# Patient Record
Sex: Male | Born: 1935 | Race: White | Hispanic: No | Smoking: Former smoker
Health system: Southern US, Community
[De-identification: ages and names within clinical notes are randomized; demographics above are authoritative.]

## PROBLEM LIST (undated history)

## (undated) DIAGNOSIS — K449 Diaphragmatic hernia without obstruction or gangrene: Secondary | ICD-10-CM

## (undated) DIAGNOSIS — I509 Heart failure, unspecified: Secondary | ICD-10-CM

## (undated) DIAGNOSIS — E041 Nontoxic single thyroid nodule: Secondary | ICD-10-CM

## (undated) DIAGNOSIS — R51 Headache: Secondary | ICD-10-CM

## (undated) DIAGNOSIS — I2510 Atherosclerotic heart disease of native coronary artery without angina pectoris: Secondary | ICD-10-CM

## (undated) DIAGNOSIS — K219 Gastro-esophageal reflux disease without esophagitis: Secondary | ICD-10-CM

## (undated) DIAGNOSIS — F0390 Unspecified dementia without behavioral disturbance: Secondary | ICD-10-CM

## (undated) DIAGNOSIS — M549 Dorsalgia, unspecified: Secondary | ICD-10-CM

## (undated) DIAGNOSIS — I82409 Acute embolism and thrombosis of unspecified deep veins of unspecified lower extremity: Secondary | ICD-10-CM

## (undated) DIAGNOSIS — R519 Headache, unspecified: Secondary | ICD-10-CM

## (undated) DIAGNOSIS — C61 Malignant neoplasm of prostate: Secondary | ICD-10-CM

## (undated) DIAGNOSIS — I10 Essential (primary) hypertension: Secondary | ICD-10-CM

## (undated) DIAGNOSIS — E1140 Type 2 diabetes mellitus with diabetic neuropathy, unspecified: Secondary | ICD-10-CM

## (undated) DIAGNOSIS — M8580 Other specified disorders of bone density and structure, unspecified site: Secondary | ICD-10-CM

## (undated) DIAGNOSIS — N189 Chronic kidney disease, unspecified: Secondary | ICD-10-CM

## (undated) DIAGNOSIS — H81399 Other peripheral vertigo, unspecified ear: Secondary | ICD-10-CM

## (undated) DIAGNOSIS — E785 Hyperlipidemia, unspecified: Secondary | ICD-10-CM

## (undated) DIAGNOSIS — E049 Nontoxic goiter, unspecified: Secondary | ICD-10-CM

## (undated) DIAGNOSIS — G8929 Other chronic pain: Secondary | ICD-10-CM

## (undated) DIAGNOSIS — H3530 Unspecified macular degeneration: Secondary | ICD-10-CM

## (undated) DIAGNOSIS — M47816 Spondylosis without myelopathy or radiculopathy, lumbar region: Secondary | ICD-10-CM

## (undated) DIAGNOSIS — G4730 Sleep apnea, unspecified: Secondary | ICD-10-CM

## (undated) DIAGNOSIS — E119 Type 2 diabetes mellitus without complications: Secondary | ICD-10-CM

## (undated) HISTORY — DX: Chronic kidney disease, unspecified: N18.9

## (undated) HISTORY — DX: Nontoxic single thyroid nodule: E04.1

## (undated) HISTORY — DX: Hyperlipidemia, unspecified: E78.5

## (undated) HISTORY — DX: Sleep apnea, unspecified: G47.30

## (undated) HISTORY — DX: Acute embolism and thrombosis of unspecified deep veins of unspecified lower extremity: I82.409

## (undated) HISTORY — PX: INGUINAL HERNIA REPAIR: SUR1180

## (undated) HISTORY — DX: Diaphragmatic hernia without obstruction or gangrene: K44.9

## (undated) HISTORY — DX: Atherosclerotic heart disease of native coronary artery without angina pectoris: I25.10

## (undated) HISTORY — PX: CHOLECYSTECTOMY: SHX55

## (undated) HISTORY — DX: Essential (primary) hypertension: I10

## (undated) HISTORY — DX: Nontoxic goiter, unspecified: E04.9

## (undated) HISTORY — PX: OTHER SURGICAL HISTORY: SHX169

## (undated) HISTORY — DX: Other specified disorders of bone density and structure, unspecified site: M85.80

## (undated) HISTORY — DX: Type 2 diabetes mellitus with diabetic neuropathy, unspecified: E11.40

## (undated) HISTORY — DX: Spondylosis without myelopathy or radiculopathy, lumbar region: M47.816

## (undated) HISTORY — DX: Malignant neoplasm of prostate: C61

## (undated) HISTORY — PX: CATARACT EXTRACTION, BILATERAL: SHX1313

## (undated) HISTORY — PX: TONSILLECTOMY AND ADENOIDECTOMY: SUR1326

## (undated) HISTORY — PX: TONSILECTOMY, ADENOIDECTOMY, BILATERAL MYRINGOTOMY AND TUBES: SHX2538

## (undated) HISTORY — DX: Type 2 diabetes mellitus without complications: E11.9

## (undated) HISTORY — PX: KNEE ARTHROSCOPY: SHX127

## (undated) HISTORY — PX: APPENDECTOMY: SHX54

## (undated) HISTORY — DX: Gastro-esophageal reflux disease without esophagitis: K21.9

---

## 1997-12-30 DIAGNOSIS — I82409 Acute embolism and thrombosis of unspecified deep veins of unspecified lower extremity: Secondary | ICD-10-CM

## 1997-12-30 HISTORY — DX: Acute embolism and thrombosis of unspecified deep veins of unspecified lower extremity: I82.409

## 1999-10-03 ENCOUNTER — Encounter: Admission: RE | Admit: 1999-10-03 | Discharge: 2000-01-01 | Payer: Self-pay | Admitting: Anesthesiology

## 2000-01-29 ENCOUNTER — Encounter: Admission: RE | Admit: 2000-01-29 | Discharge: 2000-01-29 | Payer: Self-pay | Admitting: Neurosurgery

## 2000-08-25 ENCOUNTER — Encounter: Admission: RE | Admit: 2000-08-25 | Discharge: 2000-11-23 | Payer: Self-pay | Admitting: Anesthesiology

## 2000-11-27 ENCOUNTER — Encounter: Payer: Self-pay | Admitting: Family Medicine

## 2000-11-27 ENCOUNTER — Ambulatory Visit (HOSPITAL_COMMUNITY): Admission: RE | Admit: 2000-11-27 | Discharge: 2000-11-27 | Payer: Self-pay | Admitting: Family Medicine

## 2001-04-28 ENCOUNTER — Encounter: Admission: RE | Admit: 2001-04-28 | Discharge: 2001-07-27 | Payer: Self-pay | Admitting: Anesthesiology

## 2001-06-10 ENCOUNTER — Ambulatory Visit (HOSPITAL_COMMUNITY): Admission: RE | Admit: 2001-06-10 | Discharge: 2001-06-10 | Payer: Self-pay | Admitting: Neurosurgery

## 2002-04-15 ENCOUNTER — Inpatient Hospital Stay (HOSPITAL_COMMUNITY): Admission: EM | Admit: 2002-04-15 | Discharge: 2002-04-17 | Payer: Self-pay | Admitting: Emergency Medicine

## 2002-04-15 ENCOUNTER — Encounter: Payer: Self-pay | Admitting: Emergency Medicine

## 2002-04-17 ENCOUNTER — Encounter: Payer: Self-pay | Admitting: Family Medicine

## 2002-10-13 ENCOUNTER — Encounter: Payer: Self-pay | Admitting: *Deleted

## 2002-10-13 ENCOUNTER — Inpatient Hospital Stay (HOSPITAL_COMMUNITY): Admission: EM | Admit: 2002-10-13 | Discharge: 2002-10-14 | Payer: Self-pay | Admitting: Emergency Medicine

## 2002-11-03 ENCOUNTER — Encounter: Payer: Self-pay | Admitting: Family Medicine

## 2002-11-03 ENCOUNTER — Ambulatory Visit (HOSPITAL_COMMUNITY): Admission: RE | Admit: 2002-11-03 | Discharge: 2002-11-03 | Payer: Self-pay | Admitting: Family Medicine

## 2002-11-24 ENCOUNTER — Encounter (INDEPENDENT_AMBULATORY_CARE_PROVIDER_SITE_OTHER): Payer: Self-pay | Admitting: *Deleted

## 2002-11-24 ENCOUNTER — Encounter: Payer: Self-pay | Admitting: General Surgery

## 2002-11-24 ENCOUNTER — Ambulatory Visit (HOSPITAL_COMMUNITY): Admission: RE | Admit: 2002-11-24 | Discharge: 2002-11-24 | Payer: Self-pay | Admitting: General Surgery

## 2003-01-10 ENCOUNTER — Ambulatory Visit: Admission: RE | Admit: 2003-01-10 | Discharge: 2003-01-10 | Payer: Self-pay | Admitting: *Deleted

## 2003-05-12 ENCOUNTER — Encounter: Payer: Self-pay | Admitting: General Surgery

## 2003-05-12 ENCOUNTER — Ambulatory Visit (HOSPITAL_COMMUNITY): Admission: RE | Admit: 2003-05-12 | Discharge: 2003-05-12 | Payer: Self-pay | Admitting: General Surgery

## 2003-05-20 ENCOUNTER — Inpatient Hospital Stay (HOSPITAL_COMMUNITY): Admission: EM | Admit: 2003-05-20 | Discharge: 2003-05-22 | Payer: Self-pay | Admitting: Emergency Medicine

## 2003-12-29 ENCOUNTER — Ambulatory Visit (HOSPITAL_COMMUNITY): Admission: RE | Admit: 2003-12-29 | Discharge: 2003-12-29 | Payer: Self-pay | Admitting: Family Medicine

## 2004-01-06 ENCOUNTER — Inpatient Hospital Stay (HOSPITAL_COMMUNITY): Admission: EM | Admit: 2004-01-06 | Discharge: 2004-01-09 | Payer: Self-pay | Admitting: Emergency Medicine

## 2004-03-30 ENCOUNTER — Emergency Department (HOSPITAL_COMMUNITY): Admission: EM | Admit: 2004-03-30 | Discharge: 2004-03-30 | Payer: Self-pay | Admitting: Emergency Medicine

## 2004-04-29 ENCOUNTER — Ambulatory Visit (HOSPITAL_BASED_OUTPATIENT_CLINIC_OR_DEPARTMENT_OTHER): Admission: RE | Admit: 2004-04-29 | Discharge: 2004-04-29 | Payer: Self-pay | Admitting: *Deleted

## 2004-04-30 ENCOUNTER — Ambulatory Visit (HOSPITAL_COMMUNITY): Admission: RE | Admit: 2004-04-30 | Discharge: 2004-04-30 | Payer: Self-pay | Admitting: Family Medicine

## 2004-06-27 ENCOUNTER — Encounter (HOSPITAL_COMMUNITY): Admission: RE | Admit: 2004-06-27 | Discharge: 2004-06-28 | Payer: Self-pay | Admitting: Family Medicine

## 2005-04-25 ENCOUNTER — Ambulatory Visit (HOSPITAL_COMMUNITY): Admission: RE | Admit: 2005-04-25 | Discharge: 2005-04-25 | Payer: Self-pay | Admitting: General Surgery

## 2005-10-02 ENCOUNTER — Emergency Department (HOSPITAL_COMMUNITY): Admission: EM | Admit: 2005-10-02 | Discharge: 2005-10-03 | Payer: Self-pay | Admitting: Emergency Medicine

## 2005-10-09 ENCOUNTER — Encounter: Admission: RE | Admit: 2005-10-09 | Discharge: 2005-10-09 | Payer: Self-pay | Admitting: Gastroenterology

## 2005-10-13 ENCOUNTER — Inpatient Hospital Stay (HOSPITAL_COMMUNITY): Admission: EM | Admit: 2005-10-13 | Discharge: 2005-10-18 | Payer: Self-pay | Admitting: Emergency Medicine

## 2005-10-15 ENCOUNTER — Encounter (INDEPENDENT_AMBULATORY_CARE_PROVIDER_SITE_OTHER): Payer: Self-pay | Admitting: *Deleted

## 2005-11-28 ENCOUNTER — Ambulatory Visit (HOSPITAL_COMMUNITY): Admission: RE | Admit: 2005-11-28 | Discharge: 2005-11-28 | Payer: Self-pay | Admitting: Urology

## 2005-12-02 ENCOUNTER — Ambulatory Visit: Admission: RE | Admit: 2005-12-02 | Discharge: 2005-12-20 | Payer: Self-pay | Admitting: Urology

## 2005-12-03 ENCOUNTER — Ambulatory Visit: Payer: Self-pay | Admitting: Pulmonary Disease

## 2005-12-09 ENCOUNTER — Ambulatory Visit (HOSPITAL_COMMUNITY): Admission: RE | Admit: 2005-12-09 | Discharge: 2005-12-09 | Payer: Self-pay | Admitting: Pulmonary Disease

## 2005-12-19 ENCOUNTER — Ambulatory Visit (HOSPITAL_COMMUNITY): Admission: RE | Admit: 2005-12-19 | Discharge: 2005-12-19 | Payer: Self-pay | Admitting: Family Medicine

## 2005-12-20 ENCOUNTER — Ambulatory Visit: Payer: Self-pay | Admitting: Pulmonary Disease

## 2006-01-06 ENCOUNTER — Ambulatory Visit: Admission: RE | Admit: 2006-01-06 | Discharge: 2006-02-03 | Payer: Self-pay | Admitting: Radiation Oncology

## 2006-01-21 ENCOUNTER — Ambulatory Visit: Payer: Self-pay | Admitting: Pulmonary Disease

## 2006-05-23 ENCOUNTER — Ambulatory Visit (HOSPITAL_COMMUNITY): Admission: RE | Admit: 2006-05-23 | Discharge: 2006-05-23 | Payer: Self-pay | Admitting: Family Medicine

## 2006-05-28 ENCOUNTER — Ambulatory Visit: Admission: RE | Admit: 2006-05-28 | Discharge: 2006-07-03 | Payer: Self-pay | Admitting: Radiation Oncology

## 2006-06-18 ENCOUNTER — Ambulatory Visit: Payer: Self-pay | Admitting: Cardiology

## 2006-06-24 ENCOUNTER — Ambulatory Visit: Payer: Self-pay

## 2006-06-24 ENCOUNTER — Ambulatory Visit: Payer: Self-pay | Admitting: Cardiology

## 2006-08-25 ENCOUNTER — Ambulatory Visit: Admission: RE | Admit: 2006-08-25 | Discharge: 2006-09-21 | Payer: Self-pay | Admitting: Radiation Oncology

## 2007-02-23 ENCOUNTER — Encounter: Admission: RE | Admit: 2007-02-23 | Discharge: 2007-02-23 | Payer: Self-pay | Admitting: Nephrology

## 2007-04-29 ENCOUNTER — Ambulatory Visit: Payer: Self-pay | Admitting: Cardiology

## 2007-05-20 ENCOUNTER — Ambulatory Visit: Payer: Self-pay | Admitting: Internal Medicine

## 2007-05-20 ENCOUNTER — Ambulatory Visit (HOSPITAL_COMMUNITY): Admission: RE | Admit: 2007-05-20 | Discharge: 2007-05-20 | Payer: Self-pay | Admitting: Cardiology

## 2008-08-25 ENCOUNTER — Encounter: Admission: RE | Admit: 2008-08-25 | Discharge: 2008-08-25 | Payer: Self-pay | Admitting: General Surgery

## 2008-08-25 ENCOUNTER — Other Ambulatory Visit: Admission: RE | Admit: 2008-08-25 | Discharge: 2008-08-25 | Payer: Self-pay | Admitting: Interventional Radiology

## 2008-08-25 ENCOUNTER — Encounter (INDEPENDENT_AMBULATORY_CARE_PROVIDER_SITE_OTHER): Payer: Self-pay | Admitting: Interventional Radiology

## 2008-09-14 ENCOUNTER — Encounter: Admission: RE | Admit: 2008-09-14 | Discharge: 2008-11-09 | Payer: Self-pay | Admitting: Family Medicine

## 2008-09-25 ENCOUNTER — Emergency Department (HOSPITAL_COMMUNITY): Admission: EM | Admit: 2008-09-25 | Discharge: 2008-09-25 | Payer: Self-pay | Admitting: Emergency Medicine

## 2008-10-18 ENCOUNTER — Ambulatory Visit: Payer: Self-pay | Admitting: Cardiology

## 2008-10-18 ENCOUNTER — Inpatient Hospital Stay (HOSPITAL_COMMUNITY): Admission: EM | Admit: 2008-10-18 | Discharge: 2008-10-21 | Payer: Self-pay | Admitting: Emergency Medicine

## 2008-10-19 ENCOUNTER — Ambulatory Visit: Payer: Self-pay | Admitting: Gastroenterology

## 2008-10-19 ENCOUNTER — Encounter (INDEPENDENT_AMBULATORY_CARE_PROVIDER_SITE_OTHER): Payer: Self-pay | Admitting: Family Medicine

## 2008-10-21 ENCOUNTER — Inpatient Hospital Stay: Admission: RE | Admit: 2008-10-21 | Discharge: 2008-11-10 | Payer: Self-pay | Admitting: Internal Medicine

## 2009-02-13 ENCOUNTER — Emergency Department (HOSPITAL_COMMUNITY): Admission: EM | Admit: 2009-02-13 | Discharge: 2009-02-13 | Payer: Self-pay | Admitting: Emergency Medicine

## 2009-09-12 ENCOUNTER — Encounter: Admission: RE | Admit: 2009-09-12 | Discharge: 2009-09-12 | Payer: Self-pay | Admitting: General Surgery

## 2010-03-03 IMAGING — CT CT CERVICAL SPINE W/O CM
2 of 3 series · 8 of 14 positions shown, 9 images · non-contrast
Comparison: 09/25/2008.

CT HEAD

CLINICAL DATA: Fall.

CT HEAD WITHOUT CONTRAST
CT CERVICAL SPINE WITHOUT CONTRAST
TECHNIQUE: Multidetector CT imaging of the head and cervical spine
was performed following the standard protocol without intravenous
contrast.  Multiplanar CT image reconstructions of the cervical
spine were also generated.

[Series 4: cervical 2.0 b31s · axial · 0.34mm/px · z∈[+98,+206]mm · 4 of 121 slices shown]
[im 25/121  bone]
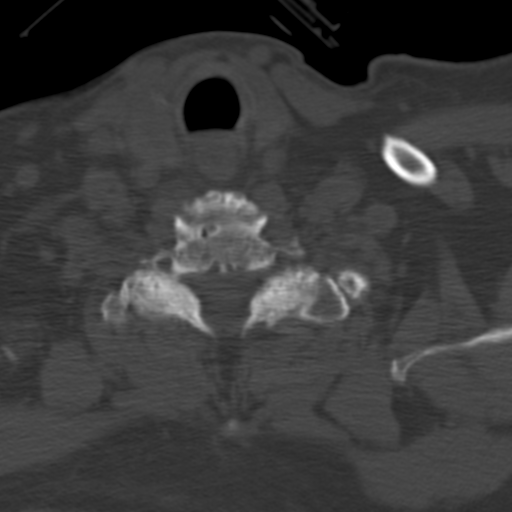
[im 49/121  bone]
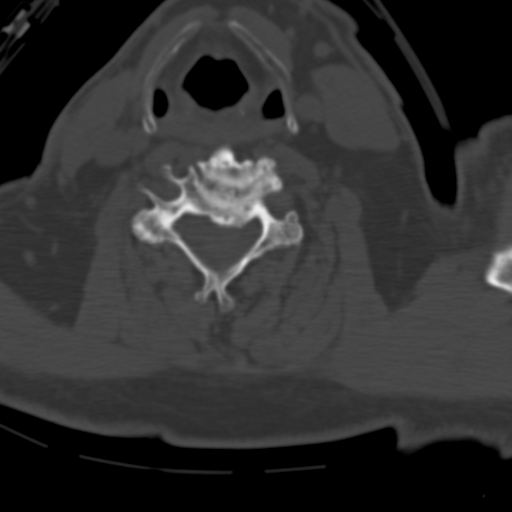
[im 73/121  bone]
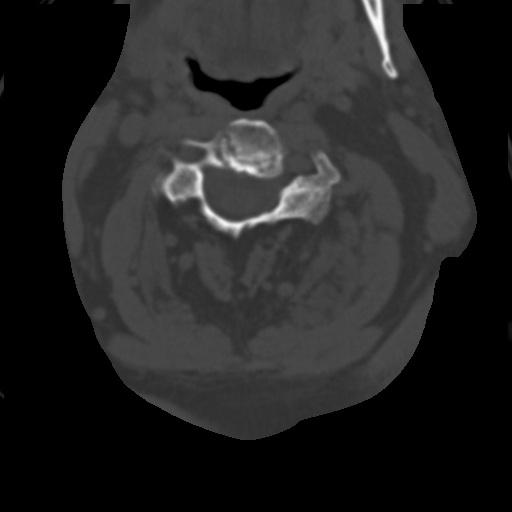
[im 97/121  bone]
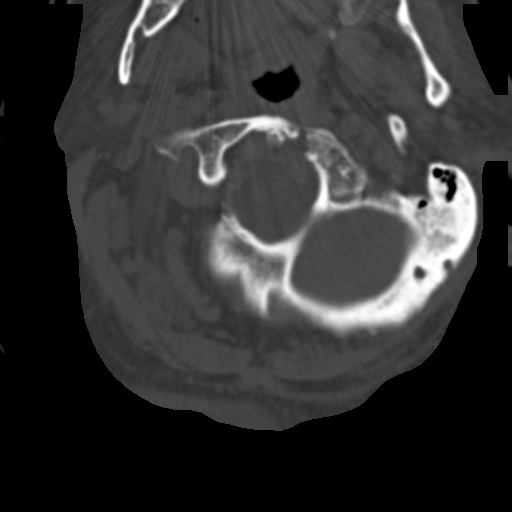

[Series 8: cervical 2.0 spo · axial · 0.19mm/px · z∈[+82,+191]mm · 4 of 125 slices shown, 5 images]
[im 25/125  soft-tissue]
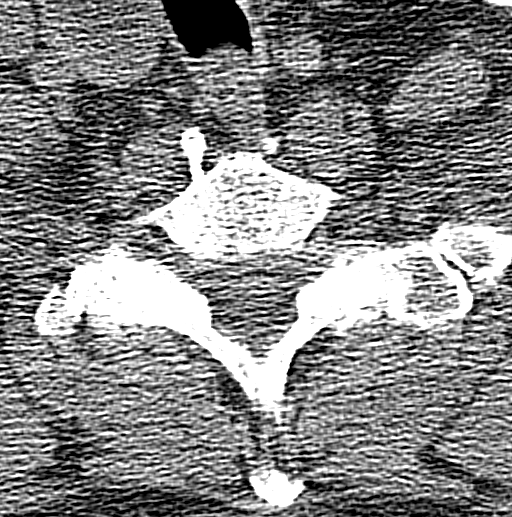
[im 25/125  bone]
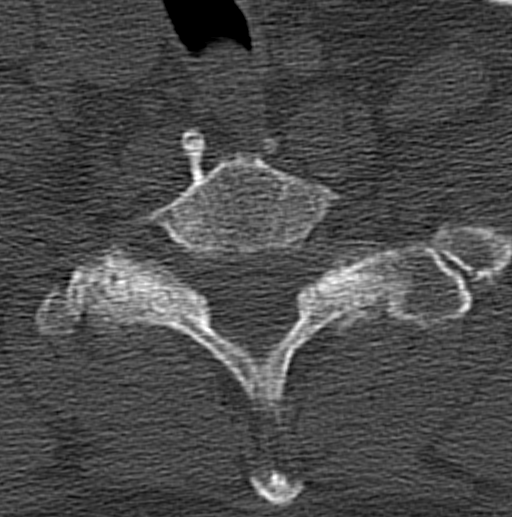
[im 50/125  bone]
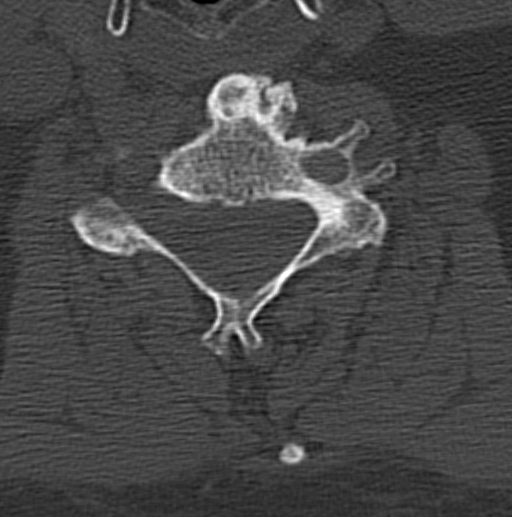
[im 75/125  bone]
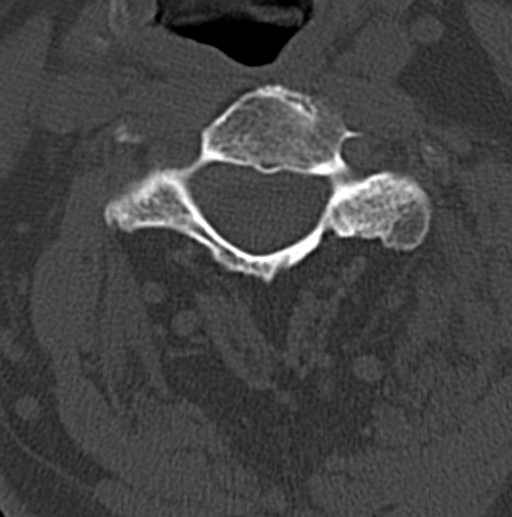
[im 100/125  bone]
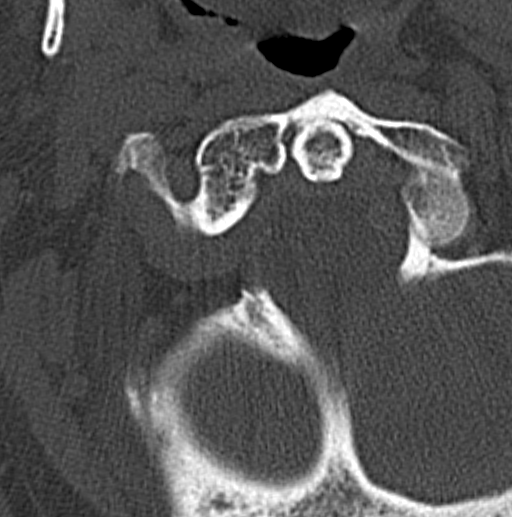

[8 of 14 positions shown; findings below may reference images not displayed]

FINDINGS: No intracranial hemorrhage.  Scattered small vessel
disease type changes without CT evidence of large acute infarct.
No hydrocephalus.  No intracranial mass lesion detected on this
unenhanced exam.  No skull fracture.  Mild polypoid opacification
left maxillary sinus.
IMPRESSION: No skull fracture intracranial hemorrhage.

Scattered small vessel disease type changes without CT evidence of
large acute infarct.

CT CERVICAL SPINE
FINDINGS: Enlarged right lobe of thyroid gland with substernal
extension with heterogeneity.  Ultrasound can be obtained further
delineation.

Mild dextroscoliosis.  Fusion C2 and C3.  Minimal anterior slip of
C3 upon C4 felt to be degenerative in origin with spinal stenosis
and cord flattening.  There are also degenerative changes at the C4-
5, C5-6 and C6-7 level.  No fracture.  If ligamentous injury or
cord injury were high clinical concern MR [facility] be considered.
IMPRESSION: Scoliosis and degenerative changes with spinal stenosis and cord
flattening most notable C3-4 level.  No fracture noted.  Please see
above.

Enlarged right lobe of the thyroid gland with substernal extension
and heterogeneity.  Ultrasound can be obtained further delineation.

## 2010-03-04 IMAGING — US US CAROTID DUPLEX BILAT
1 series · 13 of 24 positions shown · non-contrast
Comparison: None.

CLINICAL DATA: 78-year-old male with syncope.  Bell's palsy.
Diabetes and hypertension.

BILATERAL CAROTID DUPLEX ULTRASOUND
TECHNIQUE: Gray scale imaging, color Doppler and duplex ultrasound
was performed of bilateral carotid and vertebral arteries in the
neck.

[Series 1: unknown · 0.07mm/px · 13 of 53 slices shown]
[im 1/53]
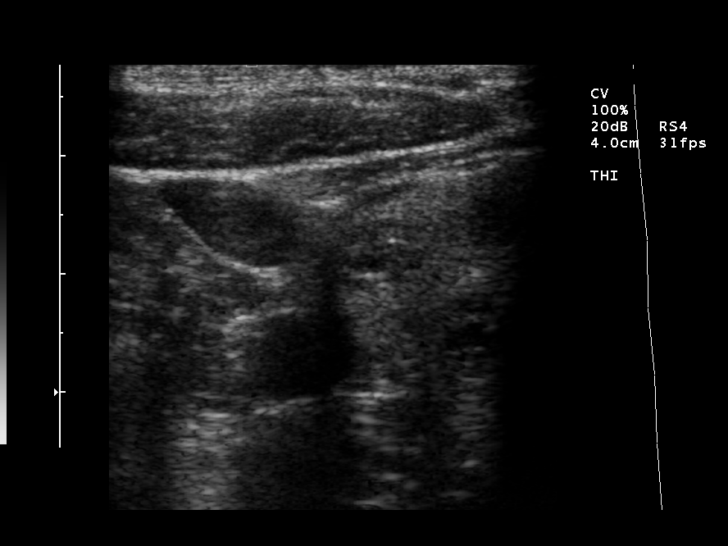
[im 5/53]
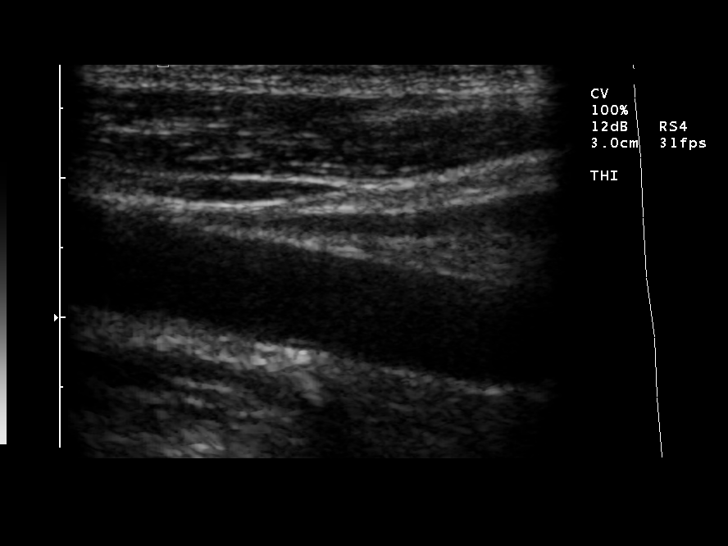
[im 10/53]
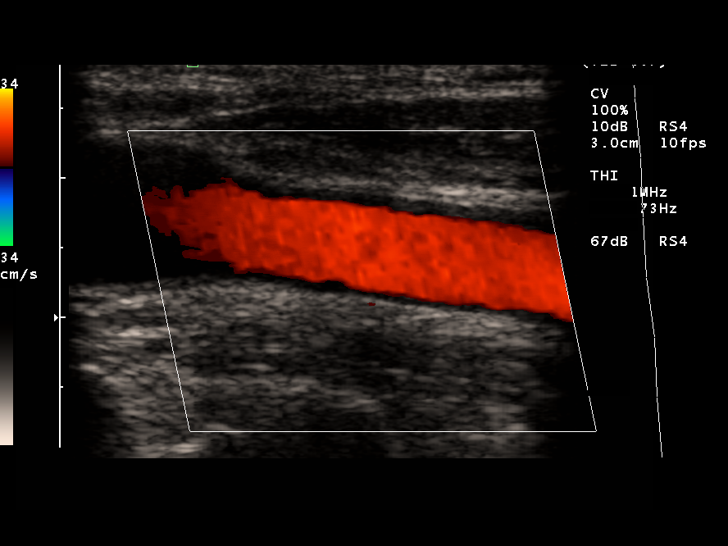
[im 14/53]
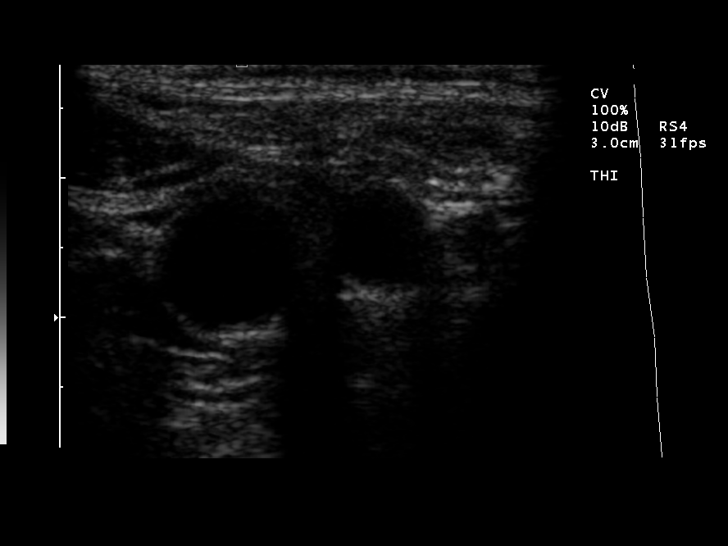
[im 19/53]
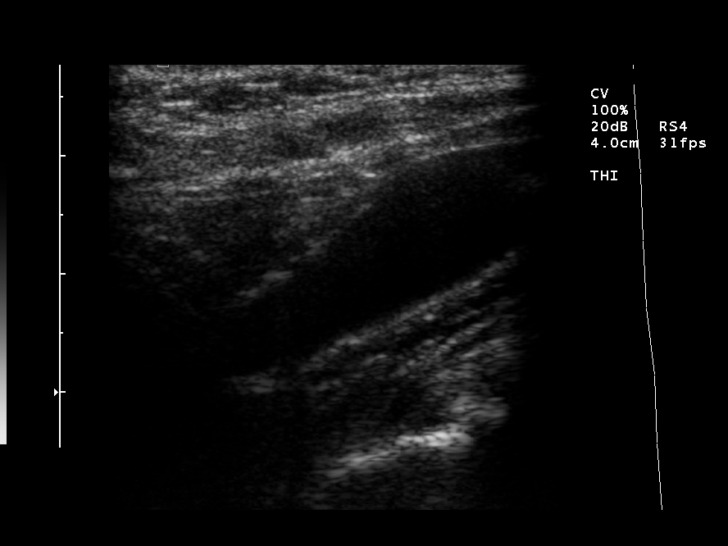
[im 23/53]
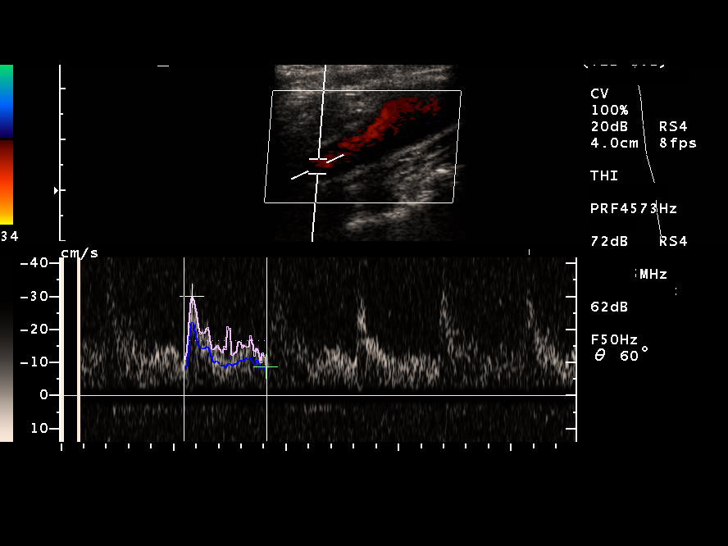
[im 28/53]
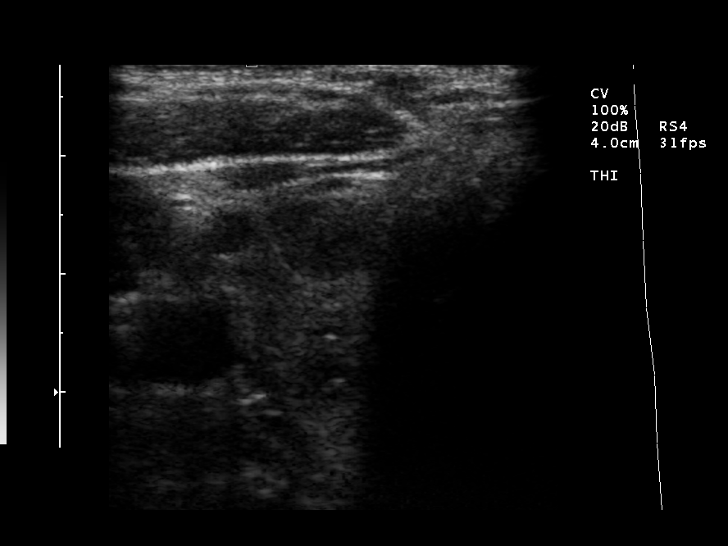
[im 30/53]
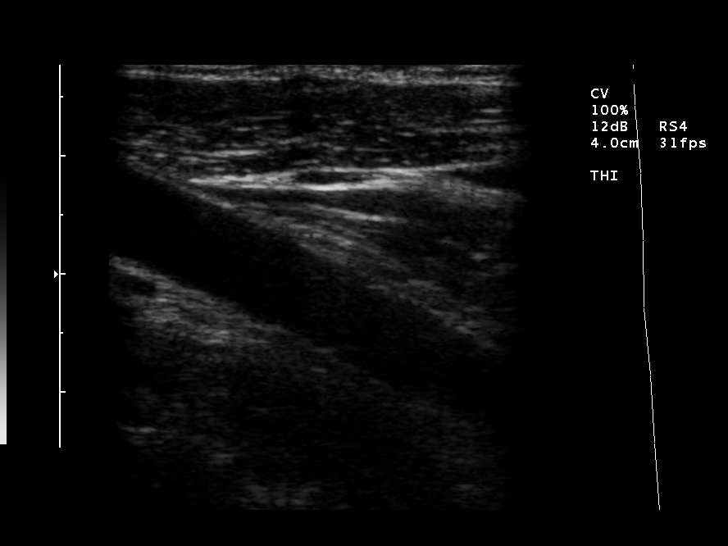
[im 34/53]
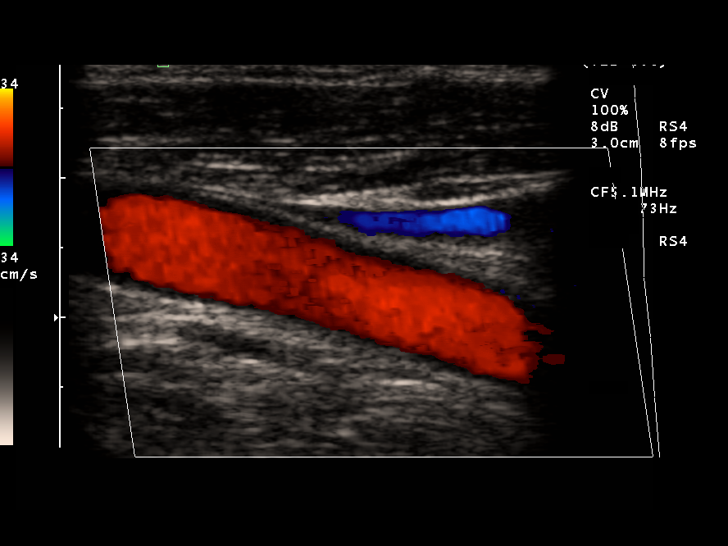
[im 39/53]
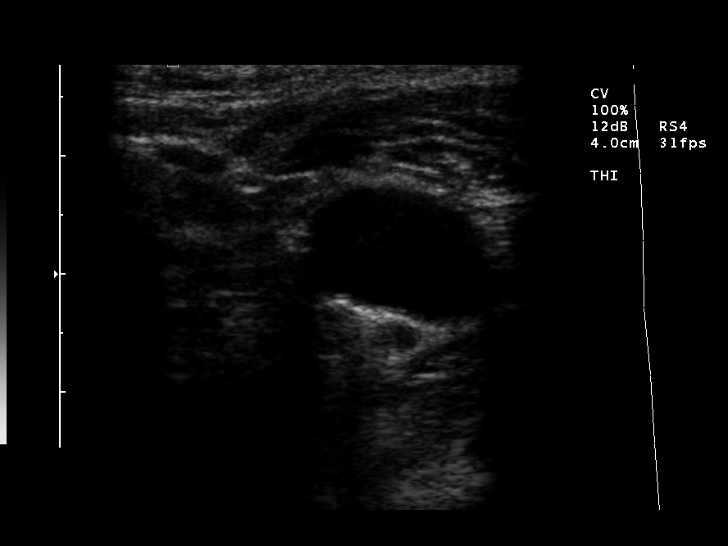
[im 43/53]
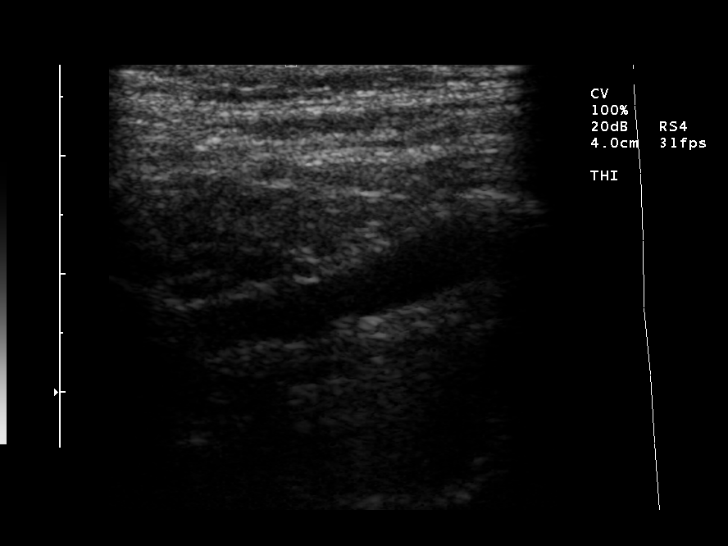
[im 48/53]
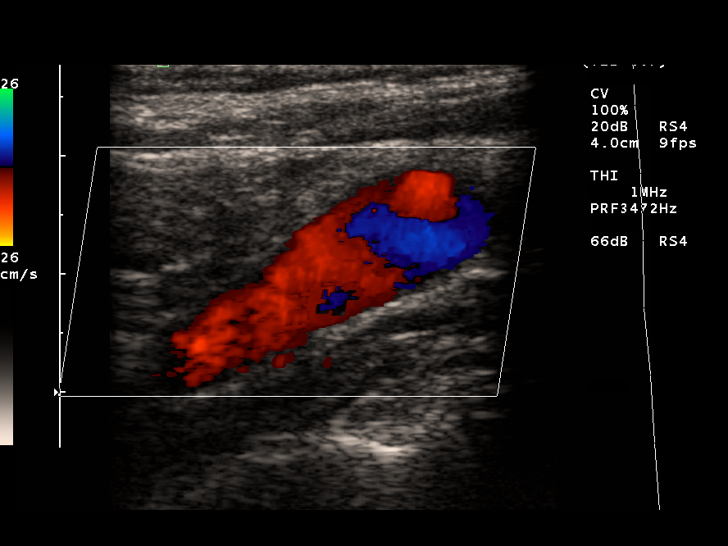
[im 53/53]
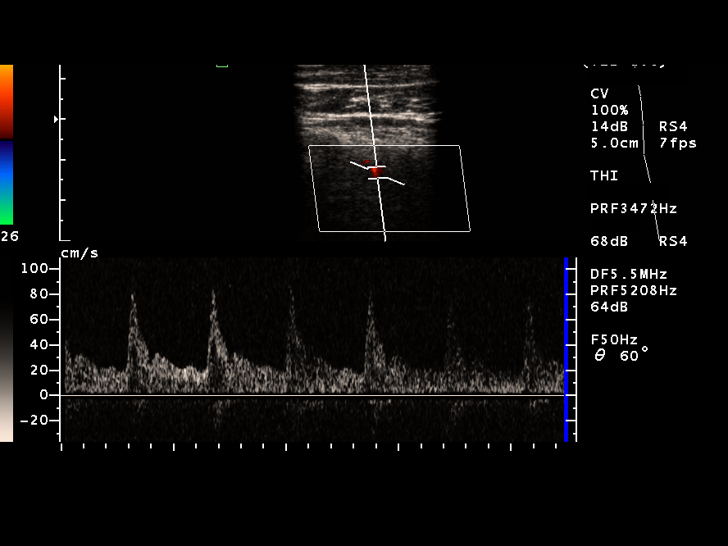

[13 of 24 positions shown; findings below may reference images not displayed]

Criteria:  Quantification of carotid stenosis is based on velocity
parameters that correlate the residual internal carotid diameter
with NASCET-based stenosis levels.

The following velocity measurements were obtained:

                 PEAK SYSTOLIC/END DIASTOLIC
RIGHT
ICA:                        34.0/8.9cm/sec
CCA:                        58.2/11.4cm/sec
SYSTOLIC ICA/CCA RATIO:
DIASTOLIC ICA/CCA RATIO:
ECA:                        59.9cm/sec

LEFT
ICA:                        54.6/14.8cm/sec
CCA:                        78.0/12.3cm/sec
SYSTOLIC ICA/CCA RATIO:
DIASTOLIC ICA/CCA RATIO:
ECA:                        13.5cm/sec
FINDINGS: RIGHT CAROTID ARTERY: Mild plaque at the carotid bifurcation
involving the external carotid artery origin, and to a lesser
extent the right ICA bulb.

RIGHT VERTEBRAL ARTERY:  Antegrade flow.

LEFT CAROTID ARTERY: No significant plaque.

LEFT VERTEBRAL ARTERY:  Antegrade flow.
IMPRESSION: 1.  Mild right carotid bifurcation plaque with ultrasound criteria
indicating right ICA stenosis of less than 50 % with respect to the
distal vessel.
2.  No left carotid plaque or ultrasound criteria to indicate left
carotid stenosis.

## 2010-08-30 ENCOUNTER — Encounter: Admission: RE | Admit: 2010-08-30 | Discharge: 2010-08-30 | Payer: Self-pay | Admitting: General Surgery

## 2011-01-19 ENCOUNTER — Encounter: Payer: Self-pay | Admitting: Family Medicine

## 2011-04-13 DIAGNOSIS — C61 Malignant neoplasm of prostate: Secondary | ICD-10-CM | POA: Insufficient documentation

## 2011-04-13 DIAGNOSIS — G4730 Sleep apnea, unspecified: Secondary | ICD-10-CM | POA: Insufficient documentation

## 2011-04-13 DIAGNOSIS — I10 Essential (primary) hypertension: Secondary | ICD-10-CM

## 2011-04-13 DIAGNOSIS — I82409 Acute embolism and thrombosis of unspecified deep veins of unspecified lower extremity: Secondary | ICD-10-CM

## 2011-04-13 DIAGNOSIS — E11319 Type 2 diabetes mellitus with unspecified diabetic retinopathy without macular edema: Secondary | ICD-10-CM | POA: Insufficient documentation

## 2011-04-13 DIAGNOSIS — N289 Disorder of kidney and ureter, unspecified: Secondary | ICD-10-CM | POA: Insufficient documentation

## 2011-04-13 DIAGNOSIS — E041 Nontoxic single thyroid nodule: Secondary | ICD-10-CM | POA: Insufficient documentation

## 2011-04-13 DIAGNOSIS — E119 Type 2 diabetes mellitus without complications: Secondary | ICD-10-CM

## 2011-04-13 DIAGNOSIS — K449 Diaphragmatic hernia without obstruction or gangrene: Secondary | ICD-10-CM

## 2011-04-13 DIAGNOSIS — E1140 Type 2 diabetes mellitus with diabetic neuropathy, unspecified: Secondary | ICD-10-CM

## 2011-04-13 DIAGNOSIS — M4306 Spondylolysis, lumbar region: Secondary | ICD-10-CM

## 2011-04-16 LAB — BASIC METABOLIC PANEL
BUN: 15 mg/dL (ref 6–23)
CO2: 29 mEq/L (ref 19–32)
Creatinine, Ser: 1.21 mg/dL (ref 0.4–1.5)
GFR calc Af Amer: >60 mL/min (ref >60)
GFR calc non Af Amer: 58 mL/min — ABNORMAL LOW (ref >60)
Potassium: 4.5 mEq/L (ref 3.5–5.1)
Sodium: 140 mEq/L (ref 135–145)

## 2011-04-16 LAB — URINALYSIS, ROUTINE W REFLEX MICROSCOPIC
Bilirubin Urine: NEGATIVE
Glucose, UA: NEGATIVE mg/dL
Ketones, ur: NEGATIVE mg/dL
Leukocytes, UA: NEGATIVE
Urobilinogen, UA: 0.2 mg/dL (ref 0.0–1.0)

## 2011-04-16 LAB — CBC
MCHC: 34.7 g/dL (ref 30.0–36.0)
MCV: 91.8 fL (ref 78.0–100.0)
Platelets: 111 10*3/uL — ABNORMAL LOW (ref 150–400)

## 2011-04-16 LAB — DIFFERENTIAL
Eosinophils Relative: 2 % (ref 0–5)
Lymphocytes Relative: 38 % (ref 12–46)
Lymphs Abs: 2.8 10*3/uL (ref 0.7–4.0)
Neutro Abs: 4 10*3/uL (ref 1.7–7.7)

## 2011-04-16 LAB — URINE MICROSCOPIC-ADD ON

## 2011-04-26 ENCOUNTER — Other Ambulatory Visit: Payer: Self-pay | Admitting: Ophthalmology

## 2011-04-26 ENCOUNTER — Encounter (HOSPITAL_COMMUNITY): Payer: Medicare Other

## 2011-04-26 LAB — CBC
HCT: 36.5 % — ABNORMAL LOW (ref 39.0–52.0)
Hemoglobin: 12.6 g/dL — ABNORMAL LOW (ref 13.0–17.0)
MCH: 31 pg (ref 26.0–34.0)
MCHC: 34.5 g/dL (ref 30.0–36.0)
MCV: 89.7 fL (ref 78.0–100.0)

## 2011-04-26 LAB — BASIC METABOLIC PANEL
Chloride: 102 mEq/L (ref 96–112)
GFR calc non Af Amer: 58 mL/min — ABNORMAL LOW (ref >60)
Potassium: 3.9 mEq/L (ref 3.5–5.1)

## 2011-05-02 ENCOUNTER — Ambulatory Visit (HOSPITAL_COMMUNITY)
Admission: RE | Admit: 2011-05-02 | Discharge: 2011-05-02 | Disposition: A | Discharge status: Home / Self Care | Payer: Medicare Other | Source: Ambulatory Visit | Attending: Ophthalmology | Admitting: Ophthalmology

## 2011-05-02 DIAGNOSIS — Z79899 Other long term (current) drug therapy: Secondary | ICD-10-CM | POA: Insufficient documentation

## 2011-05-02 DIAGNOSIS — I10 Essential (primary) hypertension: Secondary | ICD-10-CM | POA: Insufficient documentation

## 2011-05-02 DIAGNOSIS — Z7982 Long term (current) use of aspirin: Secondary | ICD-10-CM | POA: Insufficient documentation

## 2011-05-02 DIAGNOSIS — H2510 Age-related nuclear cataract, unspecified eye: Secondary | ICD-10-CM | POA: Insufficient documentation

## 2011-05-02 DIAGNOSIS — E119 Type 2 diabetes mellitus without complications: Secondary | ICD-10-CM | POA: Insufficient documentation

## 2011-05-13 ENCOUNTER — Encounter: Payer: Self-pay | Admitting: Family Medicine

## 2011-05-13 ENCOUNTER — Encounter: Payer: Self-pay | Admitting: Obstetrics and Gynecology

## 2011-05-14 NOTE — Group Therapy Note (Signed)
NAMELETROY, VAZGUEZ             ACCOUNT NO.:  [id number]   MEDICAL RECORD NO.:  [id number]          PATIENT TYPE:  INP   LOCATION:  A304                          FACILITY:  [facility]   PHYSICIAN:  Monte Fantasia, MD  DATE OF BIRTH:  July 13, 1930   DATE OF PROCEDURE:  DATE OF DISCHARGE:                                 PROGRESS NOTE   A 77 year old male patient admitted on October 18, 2008 for a syncopal  episode, dysphagia, Bell palsy, and repeated history of fall.  The  patient had 2-D echo evaluation done today early in the morning and is  awaiting on the carotid Dopplers.  The routine lab work done on him was  within normal limits.  GI consult for difficulty on swallowing secondary  due to Bell palsy since the last 2 weeks is pending.  He will follow up  with the GI consult as per their recommendations and the patient today  morning has been complaining of pain along his right angle of the mouth.  It is radiating along the face, 5/10 in intensity, sharp in nature, and  more aggravated on movement.  This has been going on and off since the  last 2 weeks.  The patient denies to have taken any pain medications and  does not want to have any pain medications for now.   PHYSICAL EXAMINATION:  VITALS:  Today morning, temperature of 97, heart  rate of 87, respiratory rate of 18, systolic blood pressure 102 with a  diastolic of 62 and saturations 95%.  HEENT:  No pallor.  No JVD.  No icterus.  No lymphadenopathy.  Pupils  equal and reacting to light.  RESPIRATORY SYSTEM:  Air entry bilaterally equal.  No rales.  No  rhonchi.  CVS:  S1 and S2.  Regular.  No murmurs.  ABDOMEN:  Soft and no organomegaly.  Bowel sounds are good.  EXTREMITIES:  No edema.  Pedal pulses are felt.   LABORATORY DATA:  Labs within normal limits.  Creatinine of 1.8.  We  will repeat the morning labs for tomorrow and monitor the creatinine.   ASSESSMENT AND PLAN:  The patient is admitted for syncope with a  history  of falls and Bell palsy.  The patient will get a GI consult.  We will  follow up with the GI consult.  Awaiting carotid Dopplers, awaiting 2-D  echo results.  We will follow up with that.  The patient has been  started on Fentanyl patch for the pain. The patient's family doctor  explained the patient management along with the patient in the room.  All the medications continued as per the reconciliation sheet.  DVT  prophylaxis on Lovenox.      Monte Fantasia, MD  Electronically Signed     MP/MEDQ  D:  10/19/2008  T:  10/20/2008  Job:  [id number]

## 2011-05-14 NOTE — H&P (Signed)
NAMEMARLOW, BERENGUER             ACCOUNT NO.:  [id number]   MEDICAL RECORD NO.:  [id number]          PATIENT TYPE:  INP   LOCATION:  A304                          FACILITY:  [facility]   PHYSICIAN:  Dorris Singh, DO    DATE OF BIRTH:  03/09/1930   DATE OF ADMISSION:  10/18/2008  DATE OF DISCHARGE:  LH                              HISTORY & PHYSICAL   The patient is a 77 year old Caucasian male who presented to the [facility] Emergency Room after a syncopal event.  He states over the last  couple days he has been feeling very dizzy just has been feeling weak.  Apparently, he was diagnosed with Bell palsy several  weeks ago and has  been placed on medication for that.  He is still having some residual  effects, particularly around his eye and still having some facial  paralysis.  Per patient's words states that he is just exhausted and  unable to do much of anything anymore.   PAST MEDICAL HISTORY:  Significant for:  1. Hypertension,  2. Diabetes.  3. Bell palsy.  4. Hyperlipidemia.  5. Prostate cancer.   Currently, the patient lives alone, has lived alone for quite some time.  He is a nonsmoker, nondrinker and denies any drug abuse.   HE HAS AN ALLERGY TO CELEBREX, MORPHINE, MOTRIN, VIOXX AND BEXTRA.   CURRENT MEDICATIONS:  Include:  1. Aspirin 81 mg once a day.  2. Centrum 1 tablet once a day.  3. Finasteride 5 mg once a day.  4. Humalog subcutaneous.  5. Januvia 50 mg once a day.  6. Lantus subcutaneous.  We do not have a dose for that.  7. Lasix 20 mg twice a day.  8. Lisinopril 20 mg once a day.  9. Metformin 500 mg once a day.  10.Metoprolol tartrate 50 mg once a day.  11.Omeprazole 20 mg once a day.  12.Simvastatin 80 mg bedtime.  13.Tandem Dual Action 160 mg once a day.  14.Terazosin 10 mg bedtime.  15.Prednisone 10 mg once a day.  16.Amoxicillin once a day.   REVIEW OF SYSTEMS:  Please refer to as above, shortness of breath,  fatigue, weakness, paralysis of  right side of face and eyelid, loss of  consciousness for syncopal episode.   PHYSICAL EXAMINATION:  VITALS:  As follows, blood pressure 156/106,  pulse rate 76, respirations 20, temperature 98.1.  GENERALLY:  The patient is a 77 year old Caucasian male who is well-  developed, well-nourished in no acute distress.  HEAD:  There is a contusion on his forehead on the left side.  EYES:  On his right side, he does have some ptosis and also some facial  paralysis of the right lip and face.  NECK:  Supple.  There is no lymphadenopathy noted.  HEART:  Regular rate and rhythm.  LUNGS:  Clear to auscultation bilaterally.  ABDOMEN:  Soft, nontender, nondistended.  EXTREMITIES:  Positive pulses.  No ecchymosis, edema or cyanosis.   His labs for today are as follows.  Apparently, there are no labs from  the emergency room that were done.  We  will go ahead and get those done.   ASSESSMENT/PLAN:  1. Syncopal episode.  2. Dysphagia.  3. Fall.   1. We will go ahead and admit the patient to the service of [facility].      We will get [facility] to consult and participate.  We will      also get a carotid Doppler and a 2-D echo.  We will get some      routine lab work on him as well.  2. For dysphagia, we will get GI to consult and participate as well      and we will have a bedside swallow study on him.  This may be      secondary to his Bell palsy.  He may have the feeling of not being      able to swallow, but we will follow up on that.  3. Fall.  The ER has x-ray all points that were of some concern.  We      will continue to monitor him and for any type of myalgias he may      have will go ahead and manage that.  4. Also, the patient and is interested in possible SNF placement.  We      will have social services come and speak with him and his family      members as well.  5. We will continue to monitor the patient and change management as      necessary.      Dorris Singh,  DO  Electronically Signed     CB/MEDQ  D:  10/18/2008  T:  10/18/2008  Job:  [id number]

## 2011-05-14 NOTE — Consult Note (Signed)
[name], [name]             ACCOUNT NO.:  [id number]   MEDICAL RECORD NO.:  [id number]          PATIENT TYPE:  INP   LOCATION:  A304                          FACILITY:  [facility]   PHYSICIAN:  Kofi A. Gerilyn Pilgrim, M.D. DATE OF BIRTH:  28-Dec-1930   DATE OF CONSULTATION:  DATE OF DISCHARGE:                                 CONSULTATION   REASON FOR CONSULTATION:  Syncope.   The patient is a 77 year old white male who had right facial weakness  about 2-3 weeks ago diagnosed with Bell palsy.  Since then he has had  problems swallowing with the food pooling on the right side of the  mouth.  The patient was hospitalized after having what is called as a  syncopal episode, but more likely a presyncopal episode.  The patient  has a lot of baseline symptoms which makes his situation hard to sort  out.  He apparently has been weak, fatigue, and having lack of energy.  He did have a sleep study done which he has been placed on CPAP.  Previously, he has been on placed on the specialized one with a backup  rate.  The patient did give a good try, but reports no significant  improvement in lack of energy.  The patient indicates that when he walks  around he gets short of breath and has to sit down to take a breath for  about 50 minutes.  He reports having dyspnea which he clearly has with  physical examination for about 5 or 6 years.  Etiology is unclear from  the chart.  The patient apparently got up to walk around and felt  lightheaded.  He reports that he has been lightheaded since he has had  the right facial weakness 3 weeks ago.  He fell forward to the ground  and did bruise the knees.  He denies any spinning sensation.  There is  no focal neurological symptoms that is new.  He again does not report  losing consciousness.  The patient has had problems eating over the last  3 weeks again due to swallowing problems with his food, mostly pooling  of the right side as the etiology.  The patient does  report having  problems with taste ever since the Bell palsy.  He also reports having  significant right facial weakness.  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  However, since his Bell  palsy reports excruciating pain, I did discuss this with a hospitalist.  We placed him on a patch due to the excruciating pain.  He has been  having while in the hospital but again this has started before being  hospitalized and since his Bell palsy.   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   ADMISSION MEDICATIONS:  Aspirin, insulin, Lasix, lisinopril, metformin,  metoprolol, omeprazole, multivitamins, finasteride, simvastatin,  trazodone, prednisone apparently being taken to treat Bell palsy and  Tandem dual action 160 mg daily, and also amoxicillin.   ALLERGIES:  VIOXX, BEXTRA, MOTRIN, MORPHINE, and CELEBREX.   PAST SURGICAL HISTORY:  Status post biopsy and removal of right canal  lesion.   REVIEW OF SYSTEMS:  Essentially same as in the history of present  illness.   PHYSICAL EXAMINATION:  GENERAL:  Obese man.  He is obviously  dyspneic.  Again he he said he has had this for 5 or 6 years.  VITAL SIGNS:  Temperature 98.3, pulse 75, respiration 20, and blood  pressure 105/59.  HEENT:  Tympanic membrane and ear looks good on the right side.  There  is some wax built up, bur nothing pathological.  No vesicles.  No other  lesions.  NECK:  Supple.  Head is normocephalic and atraumatic.  ABDOMEN:  Obese and soft.  EXTREMITIES:  No edema.  MENTATION:  The patient is awake and alert.  She converses well.  Speech, language, and cognition are intact.  CRANIAL NERVES:  Pupils are 4-5 mm and briskly reactive.  Extraocular  movements are full. Facial  muscle strength shows plegia on the right  side involving entire right facial muscles.  Tongue is midline.  Uvula  is midline.  Shoulder shrug is normal.  Visual field is full.  MOTOR EXAMINATION:  The patient has mild proximal right leg weakness,  reports that he has had this for a while.  Distal right leg and foot is  normal.  Other motor examination shows normal tone, bulk, and strength.  The bulk and tone in the right leg is also normal.  There is no pronator  drift.  Coordination; there is no dysmetria, tremors, or past-pointing.  Reflexes are preserved, slightly diminished, plantars were both flexor.  Sensation, normal to light touch and temperature.  He does report having  significant hyperalgesia and allodynia on the right facial region.  A CT  scan done on September 25, 2008 is negative.  Carotid Doppler is  negative.   Sodium 135, potassium 4.5, chloride 102, CO2 of 27, BUN 53, creatinine  1.8, glucose 149, and calcium 9.2.  LFTs normal.  WBC 8.9, hemoglobin  14.7, and platelet count of 100.  CPK 29.   ASSESSMENT:  This is a somewhat difficult case to sort out because he  has a lot of baseline symptoms.  The patient does appear to have Bell  palsy on clinical criteria.  There are something that are somewhat  atypical such as a severe neuropathic pain and allodynia on the right  facial region.  Did have some significant problems swallowing due to the  profound right facial weakness.  It does not appear to be pharyngeal  weakness.  The syncopal episodes seemed to be non-neurologic primarily  and may be due to his underlying dyspnea and fatigue which is not quite  clear what is this due to.  He apparently has had an echo which showed  an EF of 50% but may have some diastolic dysfunction.  Cardiology has  been evaluating the patient.  He does have significant sleep apnea which  undoubtedly contributes to his overall situation and really good efforts  have been on part of the  patient.  He is to restart using his machine.  He will seriously continue with the prednisone for Bell palsy.  Given  the atypical nature, I think we will go ahead and obtain an MRI of the  brain.   Thanks for this consultation.      Kofi A.  Gerilyn Pilgrim, M.D.  Electronically Signed     KAD/MEDQ  D:  10/20/2008  T:  10/20/2008  Job:  [id number]

## 2011-05-14 NOTE — Assessment & Plan Note (Signed)
[facility]                            CARDIOLOGY OFFICE NOTE   NAME:[name], [name]                    MRN:          [id number]  DATE:04/29/2007                            DOB:          11-23-1930    PRIMARY CARE PHYSICIAN:  Dr. Lindaann Pascal.   REASON FOR VISIT:  Evaluate patient with dyspnea.   HISTORY OF PRESENT ILLNESS:  The patient returns for followup of the  above. Since I last saw him, he has been diagnosed with prostate cancer  which is being treated with hormone injections. He did have a referral  to Dr. Shelle Iron because of his heavy breathing. Dr. Shelle Iron did a  pulmonary function test which demonstrated no evidence of fixed  pulmonary disease. No further pulmonary workup was planned. Cardiac  workup has included a stress perfusion study which was negative for any  evidence of high-grade ischemia. His BNP level was in the 30s. He  previously had a normal electrocardiogram .   He continues to be bothered by fatigue. This is despite that fact that  he is actually wearing CPAP and has gotten a new mask for his sleep  apnea. He is tired when he wakes up in the morning. He has shortness of  breath doing any activity. He has audible expiratory wheezes which  sounds upper airway here in the office. He does not describe PND or  orthopnea. He does not have any chest pressure, neck or arm discomfort.  He did not have palpitations, presyncope, or syncope.   PAST MEDICAL HISTORY:  Nonischemic coronary artery disease (left main  normal, LAD 30-40% mid stenosis, diagonal 30-40% stenosis, circumflex 30-  40% stenosis, right coronary artery 30-40% stenosis in 2003), well  preserved ejection fraction, deep venous thrombosis in 1998, prostate  cancer, sleep apnea, cholecystectomy, appendectomy, diabetes, thyroid  enlargement, gastroesophageal reflux disease, morbid obesity.   ALLERGIES:  MORPHINE, VIOXX, CELEBREX, and MOTRIN.   MEDICATIONS:  1. Aspirin  81 mg daily.  2. Omeprazole 20 mg daily.  3. Metoprolol 50 mg daily.  4. Simvastatin 40 mg q.h.s.  5. Lisinopril 20 mg daily.  6. Finasteride 5 mg daily.  7. Glipizide 2.5 mg b.i.d.  8. Januvia 100 mg daily.  9. Sertraline 100 mg daily.  10.Terazosin 10 mg daily.  11.Iron.  12.Multivitamin.   REVIEW OF SYSTEMS:  As stated in the HPI and otherwise negative for  other systems.   PHYSICAL EXAMINATION:  GENERAL:  The patient is in no distress.  VITAL SIGNS:  Blood pressure 120/64, heart rate 67 and regular, weight  248 pounds, body mass index 37. Oxygen saturation at rest 94%, with  ambulation 97%.  HEENT:  Eyelids unremarkable. Pupils equal round and reactive to light.  Fundi not visualized. Oral mucosa unremarkable.  NECK:  No jugular venous distention, wave form within normal limits.  Carotid upstroke brisk and symmetric, no bruits, no thyromegaly.  LYMPHATICS:  No cervical, axillary or inguinal adenopathy.  LUNGS:  Clear to auscultation with no wheezing, he does have an audible  wheeze from his upper airway or pseudowheeze. No crackles.  BACK:  No  costovertebral angle tenderness.  CHEST:  Unremarkable.  HEART:  PMI not displaced or sustained, S1 and S2 within normal limits,  no S3, no S4, no clicks, no rubs, no murmurs.  ABDOMEN:  Morbidly obese, positive bowel sounds, normal to frequency and  pitch, no bruits, no rebound, no guarding, no midline pulsatile mass, no  hepatomegaly, no splenomegaly.  SKIN:  No rashes, no nodules.  EXTREMITIES:  2+ pulses, no cyanosis, no clubbing, no edema.  NEUROLOGIC:  Oriented to person, place and time. Cranial nerves II-XII  grossly intact. Motor grossly intact.   EKG:  Sinus rhythm, rate 67, axis within normal limits, interval is  within normal limits, no acute ST-T wave changes.   ASSESSMENT/PLAN:  1. Dyspnea. The patient continues to have dyspnea without a clear      etiology. He has normal pulmonary function tests and cardiac workup       as described. I think the final test will be cardiopulmonary stress      testing. This will help differentiate between cardiac, pulmonary (I      do not suspect either of these) versus some deconditioning.  2. Obesity. He understands the need to lose weight with diet and      exercise. He says he has problems with dieting because his sugar      drops. I told him he needs to work with his primary care doctors to      adjust his diabetes medicine and to continue a diet as weight loss      is very important.  3. Nonobstructive coronary disease. He is not having any symptoms      consistent with angina. He had a negative stress perfusion study      last year. He will continue with secondary risk reduction.  4. Dyslipidemia. He had a recent lipid profile which was reasonable.      This was followed by his      primary care doctors.  5. Followup. I will see him back in 1 year or sooner if needed.     Rollene Rotunda, MD, [facility]  Electronically Signed    JH/MedQ  DD: 04/29/2007  DT: 04/29/2007  Job #: [id number]   cc:   Lindaann Pascal, MD

## 2011-05-14 NOTE — Group Therapy Note (Signed)
[name], [name]             ACCOUNT NO.:  [id number]   MEDICAL RECORD NO.:  [id number]          PATIENT TYPE:  INP   LOCATION:  A304                          FACILITY:  [facility]   PHYSICIAN:  Kofi A. Gerilyn Pilgrim, M.D. DATE OF BIRTH:  [birth year]   DATE OF PROCEDURE:  DATE OF DISCHARGE:  10/21/2008                                 PROGRESS NOTE   The patient continues to have a similar symptoms with right facial  plegia and severe neuropathic pain involving the right facial region.  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  There are some atrophy and small vessel ischemic changes, but  this is chronic.  Carotid Doppler shows no hemodynamic significant  stenosis.   ASSESSMENT AND PLAN:  Atypical Bell palsy with severe facial pain on the  right.  I suggest continuing with the current care.  If he has any  recurrent symptoms or complications, reconsult.      Kofi A. Gerilyn Pilgrim, M.D.  Electronically Signed     KAD/MEDQ  D:  10/21/2008  T:  10/22/2008  Job:  [id number]

## 2011-05-14 NOTE — Consult Note (Signed)
[name], [name]             ACCOUNT NO.:  [id number]   MEDICAL RECORD NO.:  [id number]          PATIENT TYPE:  INP   LOCATION:  A304                          FACILITY:  [facility]   PHYSICIAN:  [name], M.D.      DATE OF BIRTH:  04/20/1930   DATE OF CONSULTATION:  10/20/2008  DATE OF DISCHARGE:                                 CONSULTATION   PRIMARY CARE PHYSICIAN:  Dr. Dorris [name]   DATE OF CONSULTATION:  10/19/08   REASON FOR CONSULTATION:  Dysphagia.   HISTORY OF PRESENT ILLNESS:  [name] is a 77 year old male who was  diagnosed with Bell's palsy 3 weeks ago.  [name] reports switching to a  liquid diet because when [name] eats the food pockets in his right cheek.  [name] had mild nausea and no vomiting.  [name] denies any problems swallowing  solid food.  The problem is getting the food into his esophagus.  [name] has  been on Ensure since being diagnosed with Bell's palsy because that is  easy to handle.  [name] has felt shaky off and on.  Denies any heartburn.  [name] has not had any abdominal pain.  [name] has had a little diarrhea.  [name]  denies any constipation or blood in his stool.  [name] is on iron and so his  stools are black.  [name] has been on aspirin for 7-8 years and Prilosec for  5-6 years.  [name] denies any BC powder or Goody powder use.  [name] does not  consume alcohol ibuprofen, Motrin, Aleve or Coumadin.  [name] is not taking  any Pepto-Bismol.   PAST MEDICAL HISTORY:  1. Diabetes.  2. Prostate cancer.  3. Arthritis.  4. Thyroid nodule  5. History of colon polyps.   PAST SURGICAL HISTORY:  1. Bilateral hernia repair.  2. Appendectomy.  3. Cholecystectomy 3 years ago because his gallbladder was bad and      [name] did not have stones.   ALLERGIES:  BEXTRA, CELEBREX AND VIOXX CAUSE LEG SWELLING.  [name] IS ALSO  ALLERGIC TO MORPHINE.   MEDICATIONS:  1. Lovenox.  2. Duragesic.  3. Protonix 40 mg daily.   FAMILY HISTORY:  [name] has a family history of polyps, but no family  history of colon  cancer.   SOCIAL HISTORY:  [name] is divorced.  [name] does not smoke or drink alcohol.   REVIEW OF SYSTEMS:  Per the HPI, otherwise all systems are negative.   PHYSICAL EXAMINATION:  VITAL SIGNS:  Temperature 97, blood pressure  102/62, pulse 81, respiratory rate 18, O2 sat 95% on room air.  GENERAL:  [name] is in no apparent distress, alert and oriented x4.  HEENT:  Exam is atraumatic, normocephalic.  Pupils are equal and reactive to  light.  [name] has a piece of gauze on his right eye.  His right eyelid does  not close.  [name] has a right facial deformity.  His neck has full range of  motion and no lymphadenopathy.  LUNGS:  Clear to auscultation  bilaterally.  CARDIOVASCULAR:  Shows regular rhythm, no murmurs.  ABDOMEN:  Bowel  sounds present, soft, nontender, nondistended.  No rebound or guarding.  EXTREMITIES:  Have no cyanosis or edema.  NEUROLOGICAL:  [name] has no new  acute neurologic deficits.   LABORATORY DATA:  White count 8.9, hemoglobin 14.7, platelets 100, INR  1.2, BUN 53, creatinine 1.81, albumin 3.6 hemoglobin A1c 7.1, TSH 0.062.   ASSESSMENT:  [name] is a 77 year old male who does not have problems  with swallowing his food.  [name] has self-reported oral pharyngeal  dysphagia due to his Bell's palsy.  Thank you for allowing me to see Mr.  [name] in consultation.  My recommendations follow.   RECOMMENDATIONS:  1. [name] has no acute indication for EGD.  2. Would consider neurology consult to evaluate his Bell's palsy and      any other treatment that may be available to shorten the duration      of his symptoms.  Could consider physical therapy to work with him      in his right facial weakness.  Agree with speech therapy consult.  3. Protonix daily.      [name], M.D.  Electronically Signed     SM/MEDQ  D:  10/20/2008  T:  10/20/2008  Job:  [id number]   cc:   [name]  Fax: [phone]

## 2011-05-14 NOTE — Discharge Summary (Signed)
NAMEJAMARIE, MUSSA             ACCOUNT NO.:  [id number]   MEDICAL RECORD NO.:  [id number]          PATIENT TYPE:  INP   LOCATION:  A304                          FACILITY:  [facility]   PHYSICIAN:  Monte Fantasia, MD  DATE OF BIRTH:  [birth year]   DATE OF ADMISSION:  10/18/2008  DATE OF DISCHARGE:  LH                               DISCHARGE SUMMARY   A 77 year old male admitted on October 18, 2008 with a history of a  syncope episode, Bell's palsy dysphagia, and was evaluated for the same.  The patient has undergone a 2-D echocardiogram.  The echo shows normal  left ventricular ejection fraction, normal left ventricular systolic  function with ejection fraction of 55-60%, no significant aortic valve  regurgitation, no regional wall abnormality.  The right ventricle was  mildly dilated.  On carotid Doppler, right internal carotid artery with  50% stenosis seen.  GI evaluation done yesterday suggested oropharyngeal  dysphagia and suggested for a neurology evaluation in view of the  dysphagia secondary to the Bell's palsy.  Neurology evaluated him in the  a.m. today.  The patient's pain is better controlled compared to  yesterday, and he is feeling much better.  He is symptomatically much  better.  No overnight events.   PHYSICAL EXAMINATION:  The patient examined by the bedside.  VITAL SIGNS:  Early morning, the patient's temperature has been 98.3  with a heart rate of 75, blood pressure of 104/59 and respirations of  20.  Oxygen saturation 96% on room air.  HEAD/NECK:  Right-sided facial palsy.  Lower motor neuron facial palsy.  Also ptosis of the right eye and deviation of the tongue to the right  side.  Pupils equal and reacting to light.  No pallor, no  lymphadenopathy, no icterus, no carotid bruits.  LUNGS:  Air entry bilaterally equal.  No rales, no rhonchi.  CV:  S1 and S2.  Heart with regular rate and rhythm.  ABDOMEN:  Soft.  No organomegaly, no guarding, no rigidity, no  tenderness.  EXTREMITIES:  No edema of feet.   LABORATORY DATA:  Total WBC 8.9, hemoglobin and hematocrit of 14.7 and  42, platelets of 100.  PT, PTT and INR of 15.4, 43 and 1.2.  Sodium of  135, potassium 4.5, chloride 102, bicarbonate of 37, BUN 53, creatinine  1.8.  Liver functions are within normal limits.  Magnesium is 2.6.  Cardiac markers, 3 checks, have been negative.  Total TSH has been 0.06.   ASSESSMENT AND PLAN:  1. Dysphagia secondary to Bell's palsy.  GI evaluation appreciated      possibly secondary to oropharyngeal dysphagia secondary to Bell's      palsy.  Recommended neurology evaluation for the Bell's palsy.  2. Syncopal episode.  No telemetry events. A 2-D echocardiogram has      been normal left ventricular systolic function with an ejection      fraction of 55-60% with no regional wall abnormalities and mild      mitral regurgitation.  Carotid Doppler revealed right internal      carotid artery with 50% stenosis.  Will  have a cardiology consult      and evaluation for the same.   The patient has a skilled nursing bed arranged in a skilled nursing  facility.  Will plan for discharge today.  DVT and GI prophylaxis on  board.      Monte Fantasia, MD  Electronically Signed     MP/MEDQ  D:  10/20/2008  T:  10/20/2008  Job:  [id number]

## 2011-05-14 NOTE — Discharge Summary (Signed)
[name], DUMOND             ACCOUNT NO.:  [id number]   MEDICAL RECORD NO.:  [id number]          PATIENT TYPE:  INP   LOCATION:  A304                          FACILITY:  [facility]   PHYSICIAN:  Monte Fantasia, MD  DATE OF BIRTH:  [birth year]   DATE OF ADMISSION:  10/18/2008  DATE OF DISCHARGE:  LH                               DISCHARGE SUMMARY   ADDENDUM:  The patient is a 77 year old male admitted for recent  syncopal episode, Bell's palsy dysphagia.  He was evaluated for the  same.  Had been evaluated by cardiology and had a 2-D echo done which  showed normal left ventricular systolic function with ejection fraction  of 60%.  Carotid Dopplers showed right internal carotid artery 55%  stenosis.  The GI evaluation suggested it was oropharyngeal dysphagia.  On neurology evaluation in view of the Bell's palsy was done and  suggested the same.  Had an MRI done for the same yesterday which showed  chronic microvascular ischemia with no intracranial abnormalities.  The  patient was planned to be discharged yesterday, but however, was  symptomatic and orthostatic.  Hence, discharge was held.  He was given  IV fluids.  The patient is feeling much better.  If the patient is not  orthostatic any more and cleared by cardiology, the patient is planned  for discharge today.   PHYSICAL EXAMINATION:  VITAL SIGNS:  Temperature 98.5, pulse 90, blood  pressure 95/63.  GENERAL:  The patient is awake, alert and oriented x 3.  Comfortable in  the ER bed with no respiratory distress.  HEAD/NECK:  Right-sided facial weakness.  Ptosis of the left eye.  Deviation of the tongue to the right.  Pupils are equal and reacting to  light.  No pallor.  No lymphadenopathy.  LUNGS:  Air entry bilaterally with no rales, no rhonchi.  CVS:  S1 and S2, heart is regular rate and rhythm.  ABDOMEN:  Soft.  No organomegaly.  No distention.  No tenderness.  EXTREMITIES:  Do not show evidence of any edema.   LABORATORY  DATA:  No new labs for today.   DIAGNOSTICS:  MRI shows chronic ischemic vascular changes.  Chronic  microvascular ischemia.  No intracranial abnormality.   ASSESSMENT/PLAN:  1. Dysphagia secondary to Bell's palsy, syncopal episode.  As per the      GI evaluation, dysphagia secondary to Bell's palsy.  Agree with      neurology.  MRI done shows no chronic ischemic changes with no      acute intracranial abnormality.  We will continue with the steroids      for the same.  2. Syncopal episode.  Cardiology on board, no telemetry events.      Echocardiogram with an EF of 55-60% with normal left ventricular      function.  Carotid Dopplers, right internal carotid artery with 50%      stenosis.  The patient was orthostatic yesterday.  We will recheck      the same.  If asymptomatic and cleared by cardiology, the patient      can be discharged to  the skilled nursing facility.   DISCHARGE MEDICATIONS:  1. Multivitamins 1 tablet p.o. daily.  2. Aspirin 81 mg p.o. daily.  3. Metformin 500 mg p.o. daily.  4. Finasteride 5 mg p.o. daily.  5. Terazosin 10 mg at bedtime.  6. Lasix 40 mg half a tablet in a.m. and half at noon.  7. Simvastatin 80 MB 1 tablet p.o. daily.  8. Fentanyl Patch 25 mg to the chest wall q.72 h.  9. Lacri-Lube ophthalmic ointment 3 times a day to the right eye for      care of the right eye.  10.The patient is on DVT and GI prophylaxis.      Monte Fantasia, MD  Electronically Signed     MP/MEDQ  D:  10/21/2008  T:  10/21/2008  Job:  [id number]

## 2011-05-14 NOTE — Consult Note (Signed)
NAMECURBY, CARSWELL             ACCOUNT NO.:  [id number]   MEDICAL RECORD NO.:  [id number]          PATIENT TYPE:  INP   LOCATION:  A304                          FACILITY:  [facility]   PHYSICIAN:  Gerrit Friends. Dietrich Pates, MD, FACCDATE OF BIRTH:  1930-05-13   DATE OF CONSULTATION:  10/20/2008  DATE OF DISCHARGE:  10/21/2008                                 CONSULTATION   CARDIOLOGIST:  Rollene Rotunda, MD, [facility]   PRIMARY CARE PHYSICIAN:  Ernestina Penna, MD at [facility].   REFERRING PHYSICIAN:  [facility] hospitalist team.   REASON FOR REFERRAL:  Syncope.   HISTORY OF PRESENT ILLNESS:  Mr. Trudel is a 77 year old male with a  history of chronic dyspnea, extensively evaluated by Dr. Antoine Poche and  Pulmonology in 2007 and 2008, without specific etiology identified, who  notes chief complaint of weakness over the last year.  He is quite  limited and describes NYHA class III symptoms.  He notes dyspnea and  arthralgias from osteoarthritis, has contributing factors.  He developed  Bell palsy approximately 3-4 weeks ago and has noted increased weakness  since then with poor appetite.  On the date of admission, he got up to  go to the bathroom around 5:30 in the morning and became profoundly weak  and fell to the floor.  He denies loss of consciousness.  He denies  significant change in his dyspnea with exertion.  He denies chest pain.  He denies any recent near syncopal episodes.  Multiple x-rays in the  emergency room have been negative thus far for fracture.  An  echocardiogram has demonstrated normal LV function.  His telemetry is  without significant dysrhythmia.  We are asked to further evaluate.   PAST MEDICAL HISTORY:  1. Nonobstructive coronary artery disease by catheterization in 2003      and 2005.      a.     Cardiac catheterization in January 2005:  LAD 30-40%       proximal/mid, 30% distal; diagonal I 30%; diagonal II 40% ostial;       RCA 40% proximal,  30% mid/distal; EF 55%.      b.     Myoview in 2007, low risk per the notes.  2. Chronic dyspnea.      a.     PFT in 2008, unremarkable.      b.     CPX test - results unknown.  3. Sleep apnea.  4. Mild pulmonary hypertension by right heart catheterization in      January 2005 (pulmonary artery sytolic pressure 35 mmHg).  5. Diabetes mellitus.  6. Hypertension.  7. Hyperlipidemia.  8. GERD.  9. Prostate cancer, treated with Lupron therapy in the past.  10.Status post appendectomy.  11.Status post tonsillectomy.  12.Status post knee surgery.  13.Status post cholecystectomy.  14.Degenerative joint disease.  15.Degenerative disk disease.  16.History of DVT in 1999.  17.History of goiter.  18.Renal insufficiency.   MEDICATIONS AT HOME:  1. Multivitamin daily.  2. Aspirin 81 mg daily.  3. Metoprolol ER 50 mg daily.  4. Metformin 500 mg daily.  5. Januvia 100 mg daily.  6. Finasteride 5 mg daily.  7. Lisinopril 20 mg daily.  8. Tandem Dual Action vitamin daily.  9. Terazosin 10 mg q.h.s.  10.Lasix 40 mg half tablet b.i.d.  11.Simvastatin 80 mg q.h.s.  12.Omeprazole 20 mg daily.  13.Prednisone as directed.   ALLERGIES:  IBUPROFEN, CELEBREX, MORPHINE, VIOXX, and NSAIDs.   SOCIAL HISTORY:  The patient lives in [place] by himself.  He has a 25-  pack-year history and quit 5 years ago.  He denies alcohol abuse.   FAMILY HISTORY:  Significant for his mother dying from pulmonary  embolism and his father dying from prostate cancer.   REVIEW OF SYSTEMS:  Please see HPI.  He denies any fevers, chills, sore  throat, dysuria, melena, hematochezia, dysphagia, odynophagia,  orthopnea, PND, edema, cough, syncope, or near syncope.  He has had some  diarrhea.  Rest of the review of systems are negative.   PHYSICAL EXAMINATION:  GENERAL:  Well-nourished and well-developed male  in no distress.  VITAL SIGNS:  Blood pressure is 104/59, pulse 81, respirations 21,  temperature 98.3, oxygen  saturation 96% on room air, and weight 100.9  kg.  HEENT:  Normal.  NECK: Without JVD.  LYMPHATIC:  Without lymphadenopathy.  ENDOCRINE:  Without thyromegaly.  CARDIAC:  Normal S1, S2.  Regular rate and rhythm without murmur.  LUNGS:  Clear to auscultation bilaterally without wheezing, rhonchi, or  rales.  SKIN:  Without rash.  ABDOMEN:  Soft, nontender with normoactive bowel sounds.  No  organomegaly.  EXTREMITIES:  Without clubbing, cyanosis, or edema.  MUSCULOSKELETAL:  Without joint deformity.  NEUROLOGIC:  He is alert and oriented x3.  Cranial nerves II through XII  grossly intact.  VASCULAR:  Without carotid bruits bilaterally.   RADIOLOGY:  Head CT; negative for fractures, scattered small-vessel  disease changes noted.  C-spine CT; notable for degenerative disk  disease with spinal stenosis and cord flattening at C3-C4 and large  right thyroid lobe.  Echocardiogram on October 19, 2008, EF 55-60%, mild  LVH, and mild diastolic dysfunction.  Carotid Doppler is less than 50%,  right ICA stenosis, no left ICA stenosis.   LABS:  White count 8900, hemoglobin 14.7, hematocrit 42.9, and platelet  count 100,000.  Sodium 135, potassium 4.5, BUN 53, creatinine 1.1,  glucose 149, TSH 0.062.  Cardiac enzymes negative x3.  INR 1.2.   ASSESSMENT AND PLAN:  1. Weakness and collapse in a 77 year old male with long history of      dyspnea with exertion.  The extensive workup is largely negative      for nonobstructive coronary artery disease by cardiac      catheterization with low-risk Myoview scan in 2007 by report.  He      denies any true loss of consciousness.  He does note worsening      symptoms, weakness, and decreased appetite, since he was diagnosed      with Bell palsy.  His telemetry has revealed normal sinus rhythm      with premature ventricular contractions and occasional couplets.      His echocardiogram demonstrates normal LV function.  Orthostatic      bowel sounds will  be checked at this time.  As long as these are      normal, we suspect no further inpatient workup will be warranted.      He will need to follow up with Dr. Antoine Poche as an outpatient.  2. Depressed TSH level in a  patient with thyroid enlargement by      history.  Further workup per his primary service.  3. Thrombocytopenia.  This is mild and further workup as per his      primary service.   DISPOSITION:  Thank you very much for the consultation.  We will be glad  to follow the patient throughout the remainder of this admission.      Tereso Newcomer, PA-C      Gerrit Friends. Dietrich Pates, MD, [facility]  Electronically Signed    SW/MEDQ  D:  10/21/2008  T:  10/22/2008  Job:  [id number]   cc:   Ernestina Penna, M.D.  Fax: [phone]

## 2011-05-14 NOTE — Discharge Summary (Signed)
[name], [name]             ACCOUNT NO.:  [id number]   MEDICAL RECORD NO.:  [id number]          PATIENT TYPE:  INP   LOCATION:  A304                          FACILITY:  [facility]   PHYSICIAN:  Monte Fantasia, MD  DATE OF BIRTH:  1930-07-31   DATE OF ADMISSION:  10/18/2008  DATE OF DISCHARGE:  10/23/2009LH                               DISCHARGE SUMMARY   ADDENDUM TO DISCHARGE SUMMARY:   DISCHARGE MEDICATIONS:  Hold Lasix for now at the time of discharge as  the patient was orthostatic.      Monte Fantasia, MD  Electronically Signed     MP/MEDQ  D:  10/21/2008  T:  10/21/2008  Job:  [id number]

## 2011-05-17 NOTE — Discharge Summary (Signed)
NAMEJENNIFER, [name]                       ACCOUNT NO.:  [id number]   MEDICAL RECORD NO.:  [id number]                   PATIENT TYPE:  INP   LOCATION:  3743                                 FACILITY:  [facility]   PHYSICIAN:  Olga Millers, M.D.                DATE OF BIRTH:  08-28-1930   DATE OF ADMISSION:  05/20/2003  DATE OF DISCHARGE:  05/21/2003                           DISCHARGE SUMMARY - REFERRING   REASON FOR ADMISSION:  Please refer to dictated admission note.   LABORATORY DATA:  Normal room air ABGs.  Normal CBC.  Normal electrolytes.  BUN 29, creatinine 1.8 on admission.  Normal liver enzymes.  Cardiac  enzymes:  CPK-MB and troponin I markers normal (x 3).  Liver profile: Total  cholesterol 179, triglycerides 178, HDL 33, LDL 110 (ratio 5.4).  D-dimer  0.27.   Admission chest x-ray: No acute disease.   HOSPITAL COURSE:  The patient was admitted for symptoms felt to be atypical  for ischemic heart disease.  He has known nonobstructive coronary artery  disease by previous cardiac catheterization in October 2003.   Following admission, serial cardiac enzymes were all within normal limits.  Additionally, both room air ABG and a D-dimer were normal.  It was noted the  patient has had two previous CT scans of the chest in the past year, both of  which revealed no evidence of pulmonary embolus.   The patient was felt to be suffering from significant anxiety and was noted  to be hyperventilating at times.   Final recommendation is were to proceed with outpatient stress testing,  which will be arranged through the [facility].  No new medications were  added, but the patient was advised to cut back on aspirin to a low dose.   DISCHARGE MEDICATIONS:  1. Aspirin 81 mg daily.  2. Toprol XL 100 mg daily.  3. Terazosin 2 mg q.h.s.  4. HCTZ 25 mg daily.  5. Prilosec 20 mg daily.  6. Prednisone 20 mg daily.  7. Xanax 0.25 mg b.i.d.  8. Voltaren 75 mg b.i.d.  9. Lipitor  as previously directed.   DISCHARGE INSTRUCTIONS:  1. The patient will proceed with an adenosine Cardiolite as an outpatient.     Arrangements will be made through the [facility] office.  2. The patient is instructed to arrange followup with his primary care     physician, Dr. Rudi Heap, within the next several weeks.  3. He will return to the [facility] Cardiology service on an as-needed basis.   DISCHARGE DIAGNOSES:  1. Atypical chest pain.     a. Normal serial cardiac markers.     b. History of nonobstructive coronary artery disease, coronary angiogram        October 2003.  2. History of hypertension.  3. Dyslipidemia.  4. Mild renal insufficiency.  5. History of deep vein thrombosis.  6. Gastroesophageal reflux disease.  7. Borderline diabetes  mellitus.  8. Thyroid nodule.  9. Anxiety disorder.     Gene Serpe, P.A. [facility]                      Olga Millers, M.D.    GS/MEDQ  D:  05/22/2003  T:  05/22/2003  Job:  [id number]   cc:   Ernestina Penna, M.D.  [address] [place]  [place] [address]  Fax: [phone]

## 2011-05-17 NOTE — Procedures (Signed)
[facility]  Patient:    [name], [name]                 MRN: [id number] Proc. Date: 10/01/00 Adm. Date:  [id number] Attending:  Thyra Breed CC:         Rudi Heap, M.D., [place], [place].                           Procedure Report  PROCEDURE:  Lumbar epidural steroid injection.  DIAGNOSIS:  Lumbar spinal stenosis with underlying low back pain.  INTERVAL HISTORY:  The patient has noted some improvement after the first injection. He is interested in getting a second one today. He tolerated it well.  PHYSICAL EXAMINATION:  VITAL SIGNS:  Blood pressure 145/88, heart rate 65, respiratory rate 14, O2 saturations 98%, pain level 7.5/10, and temperature is 98.4. His blood pressure is remarkably stable after the cortisone injection.  BACK:  Shows good healing from his previous injection site.  DESCRIPTION OF PROCEDURE:  After informed consent was obtained, the patient was placed in the sitting position and monitored. The patients back was prepped with Betadine x 3. A skin wheal was raised at the L4-5 interspace with 1 percent lidocaine. A 20 gauge Tuohy needle was introduced to the lumbar epidural space to loss of resistance to preservative free normal saline. There was no cerebrospinal fluid nor blood. 80 mg of Medrol and 10 ml of preservative free normal saline was gently injected. The needle was flushed was preservative free normal saline and removed intact.  CONDITION POST PROCEDURE:  Stable.  DISCHARGE INSTRUCTIONS:  Resume previous diet. Limitations in activities per instruction sheet. Continue on current medications. Follow-up with me in 1-2 weeks for repeat injection. DD:  10/01/00 TD:  10/01/00 Job: [id number]

## 2011-05-17 NOTE — H&P (Signed)
[name], [name]             ACCOUNT NO.:  [id number]   MEDICAL RECORD NO.:  [id number]          PATIENT TYPE:  INP   LOCATION:  5702                         FACILITY:  [facility]   PHYSICIAN:  [name], M.D.   DATE OF BIRTH:  1930-03-23   DATE OF ADMISSION:  10/12/2005  DATE OF DISCHARGE:                                HISTORY & PHYSICAL   PRIMARY CARE PHYSICIAN:  Dr. Lindaann [name].   CHIEF COMPLAINT:  Weakness with increased falls and syncopal events at home.   HISTORY OF PRESENT ILLNESS:  The patient is a 77 year old male who comes to  the emergency department for the second time this week for evaluation of  increased weakness, syncopal events and ongoing abdominal pain.  The patient  reports that he has not had any appetite for several days.  He had  approximately two to three weeks of diarrhea stools and was told by his  primary care physician that he had diverticulitis and is status post two  courses of antibiotic therapy.  He was seen in the emergency department last  week as well and a right upper quadrant ultrasound and CT scan of his  abdomen were obtained at that time.  The ultrasound showed large gallstones  and sludge.  The CT scan also showed cholelithiasis.  The patient's daughter  is concerned because of the falling.  She states that he has lost about 30  pounds in the past one to two months.  The patient denies any fever or  chills.  No chest pain.  With his falling events, they were associated with  changes in position and were accompanied by passing out spells that were of  short duration although the patient cannot characterize how long.  No loss  of bowel or bladder with these spells.   ALLERGIES:  MORPHINE causes a rash.  VIOXX and CELEBREX cause swelling.  MOTRIN causes GI intolerance.   CURRENT MEDICATIONS:  1.  Prilosec 20 mg daily.  2.  Simvastatin 40 mg daily.  3.  Lasix 20 mg daily.  4.  Terazosin 4 mg at night.  5.  Metformin 850 mg twice a  day.  6.  Metoprolol 50 mg daily.  7.  Hydrochlorothiazide 25 mg daily.  8.  Aspirin 81 mg daily.  9.  Lantus 10 units subcutaneously daily.  10. Hyoscyamine sulfate 0.125 mg sublingual p.r.n.  11. Lomotil p.r.n.Marland Kitchen   PAST MEDICAL HISTORY:  1.  Diabetes.  2.  Hypertension.  3.  Hyperlipidemia.  4.  Gastroesophageal reflux disease.  5.  History of DVT in 1999.  6.  Coronary artery disease, status post cardiac catheterization on January 09, 2004 which showed non-obstructive disease in the right coronary      artery and LAD.  7.  He had normal systolic function and mild pulmonary hypertension on that      exam.  8.  Obstructive sleep apnea with intolerance to CPAP.  9.  Lumbar and cervical spondylosis, degenerative joint disease and spinal      stenosis.  10. History of appendectomy.  11. History  of tonsillectomy.  12. History of knee surgery.  13. Fatty liver.  14. Multi-nodular goiter, status post biopsy showing hyperplastic nodule in      November 2003.  15. Benign prostatic hypertrophy.  16. History of alcohol abuse, quite approximately three years ago.  17. Chronic dyspnea on exertion.  18. Anxiety.  19. History of tobacco abuse.  20. Diverticulitis.   FAMILY HISTORY:  The patient's mother is deceased at age 66 secondary to  pulmonary embolus.  She also had diabetes.  The patient's father is deceased  at age 72, secondary to prostate cancer.  The patient has a brother who died  of complications of scleroderma and a sister who has a history of colon  cancer.   SOCIAL HISTORY:  The patient is divorced x2 and lives alone.  He has a  history of tobacco use, one pack per week and quit approximately three years  ago.  No alcohol in three years.  No drugs.   REVIEW OF SYSTEMS:  As per HPI.  Again, a 30-pound weight loss in the last  one to two months.  No fever or chills.  He does have some chronic dyspnea  with no changes in his baseline status.  No cough.  No chest pain  or  arrhythmia.  He does report frequent nocturia but this is stable.  No  musculoskeletal complaints.  No melena or hematochezia.  Abdominal pain has  been [name] for several weeks.   PHYSICAL EXAMINATION:  VITAL SIGNS:  Temperature 98.2, pulse 94,  respirations 24, blood pressure 122/78.  GENERAL:  Obese male in no acute distress.  HEENT:  Normocephalic, atraumatic.  There is mild arcus senilis bilaterally.  Pupils equal, round and reactive to light and accommodation.  Extraocular  movements are intact.  Oropharynx showed dry mucous membranes and poor  dentition.  NECK:  Supple.  No thyromegaly.  No lymphadenopathy.  No jugular venous  distension.  CHEST:  Decreased breath sounds at the bases. Otherwise clear.  He is mildly  tachypneic.  HEART:  Regular rate and rhythm.  No murmurs, rubs or gallops.  ABDOMEN:  Soft.  The patient is markedly tender to palpation in the right  upper quadrant.  He has hyperactive bowel sounds.  No guarding or rebound.  RECTAL:  Reveals an enlarged prostate.  There is no stool in the vault.  Normal rectal tone.  EXTREMITIES:  No clubbing, edema or cyanosis.  SKIN:  Warm and dry without rashes.  Poor skin turgor.  NEUROLOGIC:  The patient is alert and oriented x3.  He moves all extremities  x4 with equal strength.  Cranial nerves II-XII grossly intact.   LABORATORY DATA:  Urinalysis was negative for protein, blood, wbc's, and  nitrites.  CBC showed white blood cell count of 12.1, with an absolute  neutrophil count of 9.1. Hemoglobin was 15.8, hematocrit 45.3.  Platelets  were 220.  Sodium was 131, potassium 2.7, chloride 92, bicarb 33, BUN 47,  creatinine 2, and glucose 194.  Magnesium was 2.1.  LFTs were all within the  normal range.   ASSESSMENT/PLAN:  1.  Acute cholecystitis:  The patient clinically has cholecystitis and given      his recent ultrasound findings of a large gallbladder with sludge and     cholelithiasis seen on CT scan, I think it  is reasonable to admit him      for surgical evaluation.  We will empirically treat him with Zosyn.  We  will keep him on clear liquids pending surgical evaluation for possible      cholecystectomy.  2.  Dehydration:  The patient clinically has signs of dehydration and he has      an elevated BUN to creatinine ratio, also suggesting dehydration.  We      will give him IV fluids and monitor his renal function over time.  3.  Hypokalemia, likely secondary to continuing to take his diuretics every      day:  We will hold his Lasix and give him IV supplementation.  4.  Diabetes:  The patient claims to have had poor control for the past      several weeks.  We will check an hemoglobin A1C and continue him on his      usual dose of Lantus and sliding scale insulin four times a day.  We      will titrate his insulin up as needed based on his CBG values.  Given      his creatinine of 2, I will hold his metformin for now.  5.  Hypertension:  The patient is not currently hypertensive so I will hold      his Lasix.  His hydrochlorothiazide would not be effective at a      creatinine of 2, so I will discontinue that.  I will keep him on his      metoprolol as long has his blood pressure and pulse can tolerate this      medication.           ______________________________  [name], M.D.     CR/MEDQ  D:  10/13/2005  T:  10/13/2005  Job:  [id number]   cc:   [name], M.D.

## 2011-05-17 NOTE — Op Note (Signed)
NAMEFINBAR, [name]             ACCOUNT NO.:  [id number]   MEDICAL RECORD NO.:  [id number]          PATIENT TYPE:  INP   LOCATION:  5736                         FACILITY:  [facility]   PHYSICIAN:  Anselm Pancoast. Weatherly, M.D.DATE OF BIRTH:  [birth year]   DATE OF PROCEDURE:  10/15/2005  DATE OF DISCHARGE:                                 OPERATIVE REPORT   PREOPERATIVE DIAGNOSIS:  1.  Chronic cholecystitis with stones.  2.  Small umbilical hernia  3.  History of diabetes.  4.  Mild azotemia.   OPERATIONS:  1.  Laparoscopic cholecystectomy.  2.  Cholangiogram.  3.  Repair of small umbilical hernia.   ANESTHESIA:  General anesthesia.   HISTORY:  [name] is a 77 year old male who was admitted to  [facility] on the 15th with the following history:  He said, for  several weeks, nausea, epigastric, and right upper quadrant pain was seen by  Dr. Vernon [name] who referred him down to our office; and he was seen by one of  the surgeon's.  He was not felt to have any acute problem.  He had sludge  and two good sized stones in the gallbladder; and was felt to kind of need a  little better medical workup before proceeding with a laparoscopic  cholecystectomy. However, he started having more pain and then returned; and  was admitted over the weekend.  Dr. Daphine [name] had seen him originally or maybe  Dr. Ezzard [name] had seen him originally; and then they admitted him to the  hospital and had the [facility] people see him.  His BUN and creatinine were  mildly elevated.  This appears to be chronic.  His white count was  originally was 12,000 and then since then has returned to normal.  His  hematocrit initially was 43; and then after IVs etcetera it has drifted down  to about 36.  The BUN, which was 47 on admission, has come down to a BUN of  12. The patient has continued to have right upper quadrant pain not really  localized in any other areas and they have placed him on Lovenox; and I was  thinking about adding him to the OR schedule for yesterday, but since he has  received his Lovenox we added him on the schedule for today. He has been  afebrile; has required more pain medication than you would think for  intermittent gallbladder colic; and we were thinking, really, that this very  likely was kind of a subacute cholecystitis. He has been on Zosyn and  additional dose was given this morning. The patient has about a plum-size  umbilical hernia; and I plan on repairing the umbilical hernia at the  completion of a laparoscopic cholecystectomy.   The patient was taken to the operative suite. Anesthesia went over his  fairly thoroughly and thought that because of his kidney previously mildly  dehydrated that it would be better to put a Foley catheter in him instead of  the usual no Foley for lap gallbladder. The patients underwent induction  general anesthesia, endotracheal tube, oral tube into the stomach; and then  the abdomen was first shaved and prepped with Betadine surgical solution and  draped in a sterile manner. A small transverse incision right at the lower  edge of the umbilical fascia ring was made.  The hernia sac was separated  from the subcutaneous tissue and then I opened into the hernia sac and  actually put a pursestring suture in the hernia sac to keep the CO2 in as we  were doing the laparoscopic cholecystectomy.   This Hasson cannula was introduced. The patient has a large a very  protuberant abdomen; and had some obviously old scars over the liver.  It  appears that he had broken some ribs, years ago; and, I think, these were  related to the old rib fractures and wondered if they actually had kind of  injured his liver, at the time, because of the dense adhesions in this area.  The gallbladder was tense, but not acutely inflamed; and I did take pictures  of. The upper 10-mm trocar was placed in the subxiphoid area and the two  lateral 5-mm trocars were  placed. First the adhesions were taken down so we  could flip the gallbladder up since it was kind of adherent with these dense  adhesions and these were taken down under direct vision carefully with good  hemostasis with the hot scissors. We could then kind of grasped the  gallbladder; dissected it more proximally, opening up the peritoneum and  identified the cystic duct.  This was separated, clipped flush with the  gallbladder, and then a small opening made; and a Cook catheter was  introduced and cholangiogram obtained, held in place with a clip; and this  showed good prompt filling of the extrahepatic biliary system and good flow  into the duodenum. The catheter was removed.  The cystic duct, proximally,  was triply clipped then divided. The cystic artery was next identified,  doubly clipped proximally, singly distally, and divided; and then the  gallbladder was freed from its bed using the hook electrocautery and a  little bit of using the spatula.   I then placed the gallbladder in an EndoCatch bag and switched the camera to  the upper 10-mm trocar. I then grasped the string to the bag, brought it out  through the fascia, and the umbilical hernia sac; and as we were massaging  this, trying to manipulate the bag through the fascia; the bag popped and  then at the time the gallbladder stones did drop on the omentum. There were  two reasonably good sized stones.  The Hasson cannula was out at this point,  and in trying to get the pursestring back so we good put the Hasson in; the  stones kind of slipped down at the bottom of the pelvis and we lost them  visualization.   Next, I probably spent an hour trying to look with the graspers/dissection,  trying to see if we could see the stones. Finally we basically released the  carbon dioxide; and with my finger could find the larger of the stones and could trap it so that we could put the camera back in and then could bring  it back up to  the anterior abdominal wall; and then brought it out through  the incision at the umbilicus. Dr. Polo Riley scrubbed in, not there, at the  initial portion of procedure; and he also spent probably another 20-30  minutes manipulating, looking, and trying to see if we could see anything,  and  neither of  [place] can feel any thing with our fingers, or see anything; and  we feel, that in this setting, since it was not an acutely inflamed  gallbladder, that the risk of making a big incision and a formal laparotomy  trying to find the dropped stone that it would be better just to cover him  with antibiotics an additional day or two; and proceed with the only if he  has problems with infection.   The hernia sac had been removed so that we could get our fingers in the  little fascial defect; and then close I closed the fascia transversely with  #0 Prolene sutures, two figure-of-eights on a simple stitch in the middle;  and the little opening is about the size of a quarter. The irrigating fluid  had been aspirated, reinspection everything under direct vision; and still  could not see the other little stone; and place Marcaine in the incision of  the umbilicus and close the incision. The patient tolerated the procedure  nicely and was extubated and sent to the recovery room in a stable postop  condition.  I notified the family about the dropped stone; and hopefully  this is not going to cause a problem.           ______________________________  Anselm Pancoast. Zachery Dakins, M.D.     WJW/MEDQ  D:  10/15/2005  T:  10/15/2005  Job:  [id number]   cc:   Ernestina Penna, M.D.  Fax: [phone]

## 2011-05-17 NOTE — Discharge Summary (Signed)
[name], [name]                       ACCOUNT NO.:  [id number]   MEDICAL RECORD NO.:  [id number]                   PATIENT TYPE:  INP   LOCATION:  6532                                 FACILITY:  [facility]   PHYSICIAN:  Joellyn Rued, P.A. [facility]              DATE OF BIRTH:  December 03, 1930   DATE OF ADMISSION:  10/13/2002  DATE OF DISCHARGE:  10/14/2002                           DISCHARGE SUMMARY - REFERRING   SUMMARY OF HISTORY:  The patient is a 77 year-old white male who presented  to [facility] emergency room for evaluation of chest discomfort. He  developed chest discomfort at approximately 2 a.m. on the day of admission  associated with shortness of breath, diaphoresis and nausea.  He described  the discomfort as sharp in the left side of his chest, gave it a 4 on a  scale of 0 to 10 and in the ER it was dull.  He states that the discomfort  has been Legacy since 2 a.m. until his presentation at 5 p.m.  He has  chronic dyspnea on exertion and he has also noted a rapid heart beat  recently because he has run out of his Toprol. His history is notable for  obesity, hypertension, gastroesophageal reflux disease, deep venous  thrombosis approximately five years ago not on Coumadin at this time,  questionable history of COPD, arthritis, renal insufficiency, borderline  diabetes, lumbar spine problems.  He continues to smoke.   LABORATORY DATA:  Hemoglobin and hematocrit were 15.6 and 46.1, normal  indices, platelets 193,000, WBC 7.4.  Sodium 137, potassium 4.5, BUN 20,  creatinine 1.4.  Normal LFTs.  Glucose 107.  Initial CK total was 930 with  an MB of 7.2, relative index 0.8 and Troponin 0.02.  D-dimer was slightly  high at 0.59.  PT 13.0, PTT 40.  BNP was less than 30.   EKG showed sinus tachycardia, probable left atrial enlargement, left axis  deviation, nonspecific ST-T wave changes.   Sequential hematologies were unremarkable.  Hemoglobin A1c was elevated at  7.2.   Serial of three CK, MB isoenzymes and Troponin levels were negative  for myocardial infarction.  TSH was slightly low at 0.339.   HOSPITAL COURSE:  The patient was admitted for evaluation of chest  discomfort.  He was placed on Lovenox.  Overnight the patient did not have  any further chest discomfort.  He underwent cardiac catheterization on  October 13, 2002 by Dr. Chales Abrahams.  According to his progress note, ejection  fraction was 65% without wall motion abnormalities, a 30 to 40% mid LAD, a  30% mid RCA.  Post sheath removal and bed rest he was ambulating without  difficulty.  It was noted that a CT scan was performed in the emergency room  secondary to his shortness of breath, chest discomfort and his elevated D-  dimer. This was reportedly negative for PE and DVT. Dr. Chales Abrahams stated that he  could be discharged home.   DISCHARGE DIAGNOSES:  1. Non-cardiac chest discomfort.  2. Shortness of breath of unknown etiology.  3. Hyperthyroidism as evidenced by a low TSH.  It is noted that on a prior     CT scan in April 2003 he had a low density area within the right thyroid     lobe possibly representing a small thyroid cyst.  It was felt that     further evaluation may be warranted and an ultrasound would be helpful.  4. Hyperglycemia with an elevated hemoglobin A1c.  No formal diagnosis of     diabetes.  5. History of hypertension.  6. Gastroesophageal reflux disease.  7. History as previously described.   DISPOSITION:  He is discharged home.   DISCHARGE MEDICATIONS:  He is asked to continue his home medications.  These  include:  1. Enteric coated aspirin 325 mg q.d.  2. Toprol XL 100 mg q.d.  3. Serzone 100 mg q.d.  4. Terazosin 2 mg q.h.s.  5. Hydrochlorothiazide q.d.  6. Prevacid 30 mg q.d.  7. Prednisone 20 mg q.d.  8. Xanax 0.25 mg b.i.d.  9. Voltaren 75 mg b.i.d.   ACTIVITY:  He is advised no lifting, driving, sexual activity or heavy  exertion for two days.   DIET:   Maintain low salt, low fat, low cholesterol ADA diet.   SPECIAL INSTRUCTIONS:  If he has any problems with his catheterization site  he is asked to call our office immediately. He was asked to avoid smoking or  tobacco products.   FOLLOW UP:  He was also asked to call Dr. Christell Amair in the morning to arrange a  follow-up one to two week appointment.  At that appointment, consideration  should be given to re-evaluating his thyroid cyst with low TSH level and his  elevated hemoglobin A1c.                                               Joellyn Rued, P.A. [facility]    EW/MEDQ  D:  10/14/2002  T:  10/15/2002  Job:  [id number]   cc:   Ernestina Penna, M.D.  [address] [place]  [place] [address]  Fax: [phone]

## 2011-05-17 NOTE — Procedures (Signed)
[facility]  Patient:    [name], [name]                 MRN: [id number] Proc. Date: 10/07/00 Adm. Date:  [id number] Attending:  Thyra Breed CC:         Rudi Heap, M.D., [place], [place]   Procedure Report  PROCEDURE:  Lumbar epidural steroid injection.  DIAGNOSIS:  Spinal stenosis at the lumbar level.  INTERVAL HISTORY:  The patient has noted marked improvement with this course of lumbar epidural steroid injections in comparison to previous ones.  His pressure continues to be fairly stable.  MEDICATIONS:  Unchanged.  PHYSICAL EXAMINATION:  Blood pressure 168/99, heart rate 78, respiratory rate 16, O2 saturations 97%, temperature 97.1, and pain level is 4/10.  His back shows good healing from his previous injection site.  DESCRIPTION OF PROCEDURE:  After informed consent was obtained, the patient was placed in a sitting position and monitored.  His back was prepped with Betadine x 3.  A skin wheal was raised at the L4-L5 interspace with 1% lidocaine.  A 20 gauge Tuohy needle was introduced in the lumbar epidural space to loss of resistance to preservative-free normal saline.  There was no CSF nor blood.  Medrol 80 mg in 10 mL of preservative-free normal saline was gently injected.  The needle was flushed with preservative-free normal saline and removed intact.  Postprocedure condition - stable.  DISCHARGE INSTRUCTIONS: 1. Resume previous diet. 2. Limitations on activities per instruction sheet. 3. Continue on current medications. 4. The patient plans to follow up with me on an as needed basis. DD:  10/07/00 TD:  10/08/00 Job: [id number]

## 2011-05-17 NOTE — Discharge Summary (Signed)
NAMEYECHESKEL, KUREK             ACCOUNT NO.:  [id number]   MEDICAL RECORD NO.:  [id number]          PATIENT TYPE:  INP   LOCATION:  5736                         FACILITY:  [facility]   PHYSICIAN:  Mobolaji B. Bakare, M.D.DATE OF BIRTH:  1930-06-26   DATE OF ADMISSION:  10/12/2005  DATE OF DISCHARGE:  10/18/2005                                 DISCHARGE SUMMARY   PRIMARY CARE PHYSICIAN:  Lindaann Pascal, M.D.   CONSULTS:  Anselm Pancoast. Zachery Dakins, M.D., Surgery.   FINAL DIAGNOSES:  1.  Symptomatic cholelithiasis.  2.  Renal insufficiency.  3.  Hypertension, controlled.  4.  Hypokalemia (corrected).  5.  Low thyroid stimulating hormone (0.081).  6.  Sigmoid diverticulosis noted on CT scan.   PROCEDURES:  1.  Laparoscopic cholecystectomy done by Dr. Zachery Dakins on October 15, 2005.  2.  Small umbilical hernia repair during the above procedure.  3.  CT scan of the abdomen and pelvis without contrast showed cholelithiasis      without evidence of cholecystitis, multiple bilateral simple renal      cysts, small sliding hiatal hernia, operative cholangiogram, negative      intraoperative cholangiogram.  Chest x-ray, tortuous ectatic thoracic      aortic chronic interstitial changes, no acute pulmonary findings.  4.  Pelvic CT showed sigmoid colon diverticulosis, small umbilical hernia.   HOSPITAL COURSE:  Please refer to the admission H&P for full details.  In  brief, Mr. Ikard is a 77 year old Caucasian man who presented with  abdominal pain predominately right upper quadrant, but more or less diffuse  going on for several days prior to hospitalization.  He was evaluated as an  outpatient with ultrasound of the gallbladder which showed gallstones with  gallbladder sludge, some tenderness on examination.  However, there was no  pericholecystic fluid to suggest acute cholecystitis.  This was done on  October 09, 2005.  Prior to this he had CT scan of the abdomen without any  acute findings  except cholelithiasis.  The patient presented to the  emergency department with weakness, dehydration, syncope which was thought  to be secondary to dehydration.  He had acute renal insufficiency.   He was evaluated by surgeon and he was felt to be symptomatic from  cholelithiasis and the patient was planned for laparoscopic cholecystectomy.  He was started on Zosyn and IV fluids.  Creatinine improved from 2.1 on IV  fluids and at the time of discharge creatinine was 1.6.   He had a laparoscopic cholecystectomy on October 15, 2005 without any acute  complications.  He had repair of small umbilical hernia.  Please refer to  the operative note for full details.  Postoperatively he had a little bit of  pain at the operative site.  There was no vomiting or diarrhea.  The patient  was able to tolerate diet.  He did not have any fever or leukocytosis.  He  was evaluated by physical therapy.  The patient was able to ambulate without  any assistance and PT was not necessary at this time.  In any case home OT  was arranged for follow  up.  The patient was discharge home with his  daughter.   During the initial workup TSH was checked and was 0.081.  This was followed  up by a free T4 which was 1.62 and normal.   DISCHARGE MEDICATIONS:  1.  Percocet 5/325 one to two by mouth q.4h. as needed for pain.  2.  Prilosec 20 mg by mouth daily.  3.  Simvastatin 40 mg by mouth daily.  4.  Terazosin 4 mg by mouth at bedtime.  5.  Metformin 850 mg by mouth twice a day.  6.  Metoprolol 50 mg daily.  7.  Hydrochlorthiazide 25 mg daily.  8.  Aspirin 81 mg daily.  9.  Lantus 10units subcutaneously daily.  10. Hyoscyamine sulfate 0.125 daily.   INSTRUCTIONS:  The patient was instructed to hold Lasix until he follows up  with Dr. Lindaann Pascal.  Follow up with Dr. Lorin Picket Long in 1-2 weeks and follow  up with Dr. Zachery Dakins on October 23, 2005 at 2:30 p.m.   RECOMMENDATIONS:  Follow up TSH in 3-6 months.       Mobolaji B. Corky Downs, M.D.  Electronically Signed     MBB/MEDQ  D:  10/23/2005  T:  10/23/2005  Job:  [id number]   cc:   Lindaann Pascal, M.D.   Anselm Pancoast. Zachery Dakins, M.D.  [address], [address]  [place]  [place] [address]

## 2011-05-17 NOTE — Procedures (Signed)
[facility]  Patient:    [name], [name]                 MRN: [id number] Proc. Date: 05/13/01 Adm. Date:  [id number] Attending:  Thyra Breed CC:         Monica Becton, M.D.   Procedure Report  PROCEDURE:  Lumbar epidural steroid injection.  DIAGNOSIS:  Lumbar degenerative disk disease of the lower back.  INTERVAL HISTORY:  The patient has noted minimal response to his first two injections but wants a third today.  His pressure has come down nicely.  He is tolerating the injections well.  PHYSICAL EXAMINATION:  Blood pressure 128/85, heart rate 71, respiratory rate 16, O2 saturations 97%.  Pain level is 8/10.  His back shows good healing from his previous injection site.  DESCRIPTION OF PROCEDURE:  After informed consent was obtained, the patient was placed in the sitting position and monitored.  His back was prepped with Betadine x 3.  A skin wheal was raised at the L4-L5 interspace with 1% lidocaine.  A 20 gauge Tuohy needle was introduced to the lumbar epidural space to loss of resistance to preservative-free normal saline.  The depth was 5.5 cm.  There was no CSF nor blood.  I injected 80 mg of Medrol in 8 mL of preservative-free normal saline.  The needle was flushed with preservative-free normal saline and removed intact.  Postprocedure condition - stable.  DISCHARGE INSTRUCTIONS: 1. Resume previous diet. 2. Limitations on activities per instruction sheet. 3. Continue on current medications. 4. Follow up with me in six months for a repeat series if he responds to the    third one.  He was advised that he may need to go ahead and reconsult with    Dr. Lovell Sheehan or another surgeon with regard to his back problems since he is    not as responsive to the epidurals as he has been in the past. DD:  05/13/01 TD:  05/13/01 Job: [id number]

## 2011-05-17 NOTE — Procedures (Signed)
[facility]  Patient:    [name], [name]                 MRN: [id number] Proc. Date: 04/29/01 Adm. Date:  [id number] Attending:  Thyra Breed CC:         Rudi Heap, M.D., [place], [place]   Procedure Report  PROCEDURE:  Lumbar epidural steroid injection.  DIAGNOSIS:  Lumbar degenerative disk disease.  INTERVAL HISTORY:  The patient presents for another series of lumbar epidural steroid injection.  The patient last received these back in October.  Since then, he has had his neck evaluated, and he did have spondylosis of the cervical spine with a degenerative change at C4-5, characterized as a left paracentral posterior ostia spur and at C6-7 ostium spurring right greater than left resulting in foraminal narrowing.  The patient complains of bilateral upper extremity pain as well as low back pain.  He states that the degree of pain runs neck and neck (no pun intended).  He rates his pain today at 8/10.  He has been taking his blood pressure medications, but initial blood pressure was 172/107.  On repeat, it was 156/100.  He does not have a list of his medications, but he is not on a blood thinner as he recalls.  PHYSICAL EXAMINATION:  Heart rate 60, respiratory rate 20, O2 saturations 97%. Pain level is 8/10.  Blood pressure as per above.  Straight leg raise signs are negative.  Deep tendon reflexes were symmetric in the lower extremity.  He has pain on range of motion of his neck with some mild to modest restrictions.  DESCRIPTION OF PROCEDURE:  After informed consent was obtained, the patient was placed in the sitting position and monitored.  His back was prepped with Betadine x 3.  A skin wheal was raised at the L4-L5 interspace with 1% lidocaine.  A 20 gauge Tuohy needle was introduced to the lumbar epidural space to loss of resistance to preservative-free normal saline.  There was no CSF nor blood.  I injected 40 mg of Medrol 8 mL of  preservative-free normal saline.  The needle was flushed with preservative-free normal saline and removed intact.  Postprocedure condition - stable.  DISCHARGE INSTRUCTIONS: 1. Resume previous diet. 2. Limitations on activities per instruction sheet, as outlined by my    assistant today.  He presents with a driver. 3. Continue on current medications, but he is to bring a list of these or his    medication bottles with him at his next visit. 4. Follow up with me in 1-2 weeks for a repeat injection.  He is not    interested in a cervical epidural at this time. DD:  04/29/01 TD:  04/29/01 Job: [id number]

## 2011-05-17 NOTE — Cardiovascular Report (Signed)
NAMEAYDAN, LEVITZ                       ACCOUNT NO.:  [id number]   MEDICAL RECORD NO.:  [id number]                   PATIENT TYPE:  INP   LOCATION:  6532                                 FACILITY:  [facility]   PHYSICIAN:  Veneda Melter, M.D. [facility]               DATE OF BIRTH:  [birth year]   DATE OF PROCEDURE:  10/14/2002  DATE OF DISCHARGE:                              CARDIAC CATHETERIZATION   PROCEDURES PERFORMED:  1. Left heart catheterization.  2. Left ventriculogram.  3. Selective coronary angiography.  4. Perclose, right femoral artery.   DIAGNOSES:  1. Mild coronary artery disease by angiogram.  2. Normal left ventricular systolic function.   HISTORY:  The patient is a 77 year old gentleman with a history of  hypertension, obstructive lung disease and DVT in the past, who presents  with substernal chest discomfort.  This was associated with nausea,  shortness of breath and diaphoresis.  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  Due to his multiple cardiovascular risks factors, symptoms on presentation,  abnormal ECG, he was referred for cardiac catheterization.   TECHNIQUE:  Informed consent was obtained, patient brought to the cardiac  catheterization lab, and a 6 French sheath was placed in the right femoral  artery using the modified Seldinger technique. A 6 [place] and JR4  catheters were then used to engage the left and right coronary arteries and  selective angiography performed in various projections using manual  injections of contrast. A 6 French pigtail catheter was then advanced in the  left ventricle and left ventriculogram performed using power injections of  contrast.  At the termination of the case, the catheters and sheaths were  removed and  a Perclose suture closure device deployed to the right femoral  artery  until adequate hemostasis was achieved.  The patient tolerated the  procedure well and was transferred to the ward in stable condition.   FINDINGS:  1. Left main trunk:  Large caliber vessel with mild irregularities.  2. LAD:  This is a medium caliber vessel that provides a major first     diagonal branch in its proximal segment before extending in the apex.     The LAD has mild disease of 30-40% in the mid section.  The diagonal     branch has moderate disease of 30-40% as well.  3. Left circumflex artery:  This is a medium caliber vessel that provides     four marginal branches in the mid section.  There is diffuse disease of     30-40% in the left circumflex system.  4. Ramus intermedius: This is a small caliber vessel that provides the     anterolateral wall.  The ramus vessel has mild disease.  5. Right coronary artery:  Dominant.  This is a large  caliber vessel that     provides the posterior descending artery and posterior ventricular branch     in its terminal segment. The right coronary artery has mild diffuse     disease of not greater than 30% in the mid section.   LEFT VENTRICULOGRAM:  Normal end-systolic and end-diastolic dimensions.  Overall, left ventricular function is well preserved, ejection fraction of  greater than 65%.  No mitral regurgitation.  LV pressure is 140/10, aortic  is 140/90, LVEDP equals 20.   ASSESSMENT AND PLAN:  The patient is a 77 year old gentleman with  noncritical coronary artery disease and well preserved left ventricular  function.  Continued medical therapy will be pursued and other causes of  chest pain investigated.                                                  Veneda Melter, M.D. [facility]    NG/MEDQ  D:  10/14/2002  T:  10/15/2002  Job:  [id number]   cc:   Duke Salvia, M.D. [facility]   Ernestina Penna, M.D.  [address] [place]  [place] [address]  Fax: [phone]

## 2011-05-17 NOTE — H&P (Signed)
[name], [name]                       ACCOUNT NO.:  [id number]   MEDICAL RECORD NO.:  [id number]                   PATIENT TYPE:  INP   LOCATION:  3743                                 FACILITY:  [facility]   PHYSICIAN:  Olga Millers, M.D.                DATE OF BIRTH:  03/02/1930   DATE OF ADMISSION:  05/20/2003  DATE OF DISCHARGE:                                HISTORY & PHYSICAL   HISTORY OF PRESENT ILLNESS:  [name] is a 77 year old male with a past  medical history of borderline diabetes mellitus, hypertension,  hyperlipidemia, chronic dyspnea, chest pain, gastroesophageal reflux  disease, and deep vein thrombosis who presents for evaluation of chest pain.  The patient has had problems with chronic chest pain and states that he had  a workup in the 80's.  Most recently he has had two chest CT's.  One was  performed on May 17, 2002, and the other on October 13, 2002.  Both showed  no evidence of pulmonary embolus.  He also had a cardiac catheterization in  October 2003, that showed nonobstructive disease and an ejection fraction of  65%.  He has continued to have occasional chest pain.  It typically occurs  with stress.  There is associated numbness in his left upper extremity, as  well as shortness of breath and diaphoresis, but there is no nausea or  vomiting.  The pain lasts anywhere from 5 to 30 minutes.  He also has  chronic dyspnea on exertion, but there is no orthopnea, PND, or pedal edema.  He has been evaluated apparently at [facility] for his  dyspnea, and no etiology has been found.  The patient apparently was working  in his garden today.  After returning, he developed an episode of dizziness  after standing.  His stated his blood pressure decreased to 73/54.  He did  not have his typical chest pain.  He has had no fever or chills, and no  hemoptysis.  He was seen by Dr. Christell Lavontay and transferred to the emergency room  for further evaluation.  He  is presently asymptomatic.   ALLERGIES:  1. MORPHINE.  2. VIOXX.  3. CELEBREX.  4. MOTRIN.   MEDICATIONS:  1. Enteric coated aspirin 325 mg p.o. daily.  2. Toprol 100 mg p.o. daily.  3. Serzone 100 mg p.o. daily and 200 mg p.o. q.h.s.  4. Hydrochlorothiazide.  5. Prevacid.  6. Prednisone.  7. Xanax.  8. Voltaren.   PAST MEDICAL HISTORY:  1. Borderline diabetes mellitus.  2. Hypertension.  3. Hyperlipidemia.  4. He has a thyroid nodule that is being followed by Dr. Derrell Lolling in surgery.  5. He also has chronic dyspnea on exertion with no clear etiology.  6. History of acute renal insufficiency felt secondary to medications.  7. History of gastroesophageal reflux disease.  8. He has had a prior arthroscopy on his knee.  9.  He has had an appendectomy, hernia repair, and tonsillectomy.   SOCIAL HISTORY:  He does have a history of tobacco use, as well as alcohol  use.   FAMILY HISTORY:  Positive for coronary artery disease.   REVIEW OF SYMPTOMS:  He denies any headaches, fever, or chills.  There is no  productive cough or hemoptysis.  There is no dysphagia, odynophagia, melena,  or hematochezia.  There is no dysuria or hematuria.  There is no seizure  activity.  There is no orthopnea, PND.  He has chronic dyspnea on exertion.  The remainder of the review of systems are negative.   PHYSICAL EXAMINATION:  VITAL SIGNS:  Blood pressure of 108/82, pulse 65.  GENERAL:  He is well-developed and anxious appearing.  He is in no acute  distress at present.  SKIN:  Warm and dry.  There is no peripheral clubbing.  HEENT:  Unremarkable with normal eyelids.  NECK:  Supple, with normal carotid upstrokes bilaterally, no bruits noted.  There was no jugular venous distention and no thyromegaly noted.  CHEST:  Clear to auscultation and percussion.  HEART:  Regular rate and rhythm.  Normal S1 and S2.  There are no murmurs,  rubs, or gallops noted.  ABDOMEN:  Nontender, nondistended, positive  bowel sounds, no  hepatosplenomegaly, no masses appreciated.  There is no abdominal bruit.  He  has 2+ femoral pulses bilaterally and no bruits.  EXTREMITIES:  No edema and there are no cords palpated.  He has a negative  Homan's bilaterally.  He has 2+ dorsalis pedis pulses bilaterally.  NEUROLOGIC:  Grossly intact.   LABORATORY DATA:  His electrocardiogram today shows normal sinus rhythm at a  rate of 63.  The axis is normal.  There are no ST changes.   DIAGNOSES:  1. Chronic chest pain.  2. Chronic dyspnea on exertion.  3. History of borderline diabetes mellitus.  4. Hypertension.  5. Hyperlipidemia.  6. History of deep vein thrombosis.  7. Gastroesophageal reflux disease.   PLAN:  [name] presents with complaints of chest pain, near syncope with  low blood pressure, and chronic dyspnea on exertion.  The etiology of this  is not clear to me.  His electrocardiogram shows no acute ST changes, and he  did have a cardiac catheterization in October that showed non-obstructive  disease.  We will admit to telemetry and cycle enzymes.  We will recheck his  electrocardiogram tomorrow morning.  If they are negative, we will plan an  outpatient Cardiolite.  He also complains of significant dyspnea.  I will  check a room air arterial blood gas, and his chest x-ray is pending at the  time of this dictation.  Of note, I do not suspect pulmonary embolus as he  has had two chest CT's in the past one year that were negative.  He also has  a negative D-dimer on this admission at 0.27.  He  may require pulmonary evaluation as an outpatient.  I do think that many of  the patient's symptoms are related to anxiety.  He also has a history of a  thyroid nodule, and is being followed by Dr. Derrell Lolling, and is scheduled to  follow up next week.  We will make further recommendations once we have his  laboratories.  Olga Millers, M.D.    BC/MEDQ  D:   05/20/2003  T:  05/21/2003  Job:  [id number]

## 2011-05-17 NOTE — H&P (Addendum)
NAMEARDA, KEADLE                       ACCOUNT NO.:  [id number]   MEDICAL RECORD NO.:  [id number]                   PATIENT TYPE:  INP   LOCATION:  1824                                 FACILITY:  [facility]   PHYSICIAN:  Duke Salvia, M.D. [facility]           DATE OF BIRTH:  03-Jul-1930   DATE OF ADMISSION:  10/13/2002  DATE OF DISCHARGE:                                HISTORY & PHYSICAL   CHIEF COMPLAINT:  Mr. Mayse is referred to the emergency room by Dr. Christell Panfilo  because of episodes of chest pain.   HISTORY OF PRESENT ILLNESS:  Mr. Wimberly is a 77 year old gentleman with a  past history of chronic dyspnea for which he takes bronchodilators and  steroids, hypertension, question dyslipidemia, cigarette use, question  diabetes, awakened this morning with a chest tightness that radiated into  his neck and to his left arm, associated with significant shortness of  breath and nausea.  There was no diaphoresis.  This had occurred in the 48  hours since he had run out of Toprol for which he had substituted  lisinopril.  He had also noted over the preceding 48 hours that his heart  rate had been in the 115-140 range, and his blood pressure had been quite  labile.  The tightness persisted for a couple of hours until he finally went  back to sleep and when he awakened this morning, there was some residual  pressure, but the multitude of the tightness was gone.  Because of this and  his anxiety about it, he presented to Dr. Christell Mackay, who referred him for  further evaluation.  He was treated in the emergency room apparently with  nitroglycerin and IV beta blockers with resolution of his discomfort.   His history of chest discomfort is recurrent.  He has had it for many years.  It is primarily related to anxiety.  He is markedly limited, however, in his  exercise tolerance secondary to arthritis involving both hips and his  breathing problem.  He has chronic two-pillow orthopnea and peripheral  edema.  He is quite reckless in his use of salt, notwithstanding his  hypertension and edema.   He denies syncope or prior stroke.   PAST MEDICAL HISTORY:  In addition to above is notable for reflux disease,  history of a DVT five years ago for which he was previously on Coumadin and  arthritis as well as some renal insufficiency.   FAMILY HISTORY:  Notable for what sounds like scleroderma in his brother and  may be accompanied by pulmonary hypertension.   SOCIAL HISTORY:  He is married and divorced.  He has three children.  He is  accompanied today by his daughter.  He drinks a couple of times a week.  He  smokes a pack every week or so, and he lives by himself.  He cooks for  himself.  He works as a Arts development officer.  REVIEW OF SYSTEMS:  As noted on __________ intake sheet.   MEDICATIONS:  1. Toprol 100  2. Aspirin.  3. Prednisone 20.  4. Serzone 100.  5. Voltaren 75 b.i.d.  6. Hydrochlorothiazide 12.5.  7. Prevacid 30.  8. Xanax 0.25 b.i.d.   ALLERGIES:  He has no known allergies.   PHYSICAL EXAMINATION:  GENERAL:  He is an elderly, Caucasian man, quite  anxious-appearing.  VITAL SIGNS:  Blood pressure 139/75, pulse 94.  HEENT:  No sclerae icterus.  No xanthoma.  NECK:  His neck veins are flat.  His carotids are brisk and full bilateral  without bruits.  There is no lymphadenopathy.  BACK:  Without kyphosis or scoliosis.  LUNGS:  Clear, but there is diffuse wheezing with an I:E ratio of 1:2.  CARDIAC:  Heart sounds are regular without murmurs or gallops.  ABDOMEN:  Protuberant without midline pulsation or hepatomegaly.  Femoral  pulses were 2+.  Distal pulses were intact.  EXTREMITIES:  No cyanosis, clubbing, or edema.  NEUROLOGIC:  Grossly normal.  SKIN:  Warm and dry.   Electrocardiogram dated today demonstrated sinus rhythm at 92 with intervals  of .16/.08/.33.  There was left axis deviation, and the electrocardiogram  was otherwise without acute  changes.   IMPRESSION:  1. Chest pain with typical and atypical features.  2. Chronic dyspnea with wheezing.  3. Multiple cardiac risk factors including:     a. Hypertension.     b. Cigarettes.     c. Hyperglycemia.     d. Question hyperlipidemia.  4. Tachy palpitations associated with: QA MARKER: 307____related to                                                   running out of his beta blocker.  5. Remote history of deep venous thrombosis.  6. Family history of ? scleroderma.  7. Significant anxiety.  8. Arthritis.   DISCUSSION:  Mr. Coble has significant symptoms and anxiety.  This is his  second hospitalization in six months for chest pain.  Apparently Cardiolite  evaluation recommended at the last time did not happen.  Given his multitude  of risk factors and the high degree of anxiety and his multiple intermediate  risk factors, I would lean towards cardiac catheterization, although  Cardiolite scanning might also be useful.  His exercise intolerance,  however, would require [place] to use vasodilators, and would be concerned about  the adenosine in this regard.   I also think it would be useful to have him seen by pulmonary, either as an  inpatient or as an outpatient given the chronic dyspnea.  Dietary counseling  would also be useful.   This evening we will get a D-dimer to exclude pulmonary embolism.                                               Duke Salvia, M.D. [facility]    SCK/MEDQ  D:  10/13/2002  T:  10/14/2002  Job:  [id number]   cc:   Ernestina Penna, M.D.  [address] [place]  [place] [address]  Fax: [phone]

## 2011-05-17 NOTE — Procedures (Signed)
[facility]  Patient:    [name], [name]                 MRN: [id number] Proc. Date: 05/06/01 Adm. Date:  [id number] Attending:  Thyra Breed CC:         Monica Becton, M.D.   Procedure Report  PROCEDURE:  Lumbar epidural steroid injection.  DIAGNOSIS:  Lumbar degenerative disk disease with low back pain.  INTERVAL HISTORY:  The patients only noted mild improvement at best from his first injection. He still wishes to proceed with another injection today.  CURRENT MEDICATIONS:  Toprol 100 mg 1.5 tablets per day. Naprosyn 500 mg b.i.d. Elavil 25 mg at night. Flexeril 10 mg t.i.d. and Darvocet p.r.n.  PHYSICAL EXAMINATION:  Blood pressure 138/88, heart rate 67, respiratory rate 18, O2 saturations 97%, pain level is 7/10. He shows good healing from his previous injection site.  DESCRIPTION OF PROCEDURE:  After informed consent was obtained, the patient was placed in the sitting position and monitored. The patients back was prepped with Betadine x 3. A skin wheal was raised at the L4-5 interspace with 1 percent lidocaine. A 20 gauge Tuohy needle was introduced to the lumbar epidural space to loss of resistance to preservative free normal saline. The depth was 6 cm. There was no cerebrospinal fluid nor blood. The 40 mg of Medrol and 8 ml of preservative free normal saline was gently injected. The needle was flushed with preservative free normal saline and removed intact.  CONDITION POST PROCEDURE:  Stable.  DISCHARGE INSTRUCTIONS:  Resume previous diet. Limitations in activities per instruction sheet. Continue on current medications. Follow-up with me in one week for repeat injection. DD:  05/06/01 TD:  05/06/01 Job: [id number]

## 2011-05-17 NOTE — Discharge Summary (Signed)
[name], [name]                       ACCOUNT NO.:  [id number]   MEDICAL RECORD NO.:  [id number]                   PATIENT TYPE:  INP   LOCATION:  5529                                 FACILITY:  [facility]   PHYSICIAN:  Tamika J. Lazarus Salines, MD                  DATE OF BIRTH:  21-Mar-1930   DATE OF ADMISSION:  04/15/2002  DATE OF DISCHARGE:  04/17/2002                                 DISCHARGE SUMMARY   DISCHARGE DIAGNOSES:  1. Chest pain ruled out for MI.  2. Acute renal failure.  3. Hypertension.  4. History of alcohol abuse.  5. Anxiety.   DISCHARGE MEDICATIONS:  1. Hytrin 2 mg p.o. q.h.s.  2. Aspirin 325 mg p.o. q.d.  3. Prilosec 2 pills p.o. q.d.  4. Patient was instructed to hold his hydrochlorothiazide, lisinopril, and     Toprol XL until seeing his family physician.   HISTORY OF PRESENT ILLNESS:  Please see full H&P for details.  In brief, the  patient is a 77 year old white male with a past medical history significant  for hypertension and a history of DVT who presented to [name] with a 2-  week history of progressive shortness of breath and chest heaviness.   LABORATORY DATA:  Labs on admission included WBC 8.0, hemoglobin of 15.7,  platelets 176.  Sodium 136, potassium 3.6, chloride 100, bicarb 25, BUN 36,  creatinine 2.6, glucose 136, total protein 7.0, albumin 3.9, AST 24, ALT 32,  alk phos 55, total bili 1.1.  A chest x-ray obtained on admission showed no  acute process.  EKG on admission revealed sinus tachycardia at a rate of 100  without acute ST or T wave changes.   HOSPITAL COURSE BY PROBLEM:  1. Chest pain/chest heaviness.  Patient was monitored on telemetry with no     events reported.  He was ruled out for MI as serial cardiac enzymes were     all negative.  A fasting lipid panel was obtained this admission and     revealed a cholesterol of 167, HDL cholesterol 31, and LDL cholesterol     92.  He was continued on Aspirin throughout  hospitalization.   1. Acute renal failure.  Patient was treated with IV fluids and his     creatinine decreased from 2.6 on admission to 1.3 on day of discharge.     Exact cause of acute renal failure was unknown.  However, we did suspect     several possibilities including his ACE inhibitor, newly prescribed     Hydrochlorothiazide and his history of BPH.   1. Shortness of breath.  These symptoms gradually improved over the course     of his hospital stay.  A chest CT to rule out PE was negative.  Lower     extremity dopplers to rule out DVT was also negative.    DISCHARGE INSTRUCTIONS:  Patient was discharged  to home in stable condition.  He was  instructed to call and schedule a follow-up appointment with his  physician at [name].                                               Tamika J. Lazarus Salines, MD    TJL/MEDQ  D:  09/09/2002  T:  09/11/2002  Job:  [id number]

## 2011-05-17 NOTE — Discharge Summary (Signed)
NAMEDEMARIOUS, KAPUR                       ACCOUNT NO.:  [id number]   MEDICAL RECORD NO.:  [id number]                   PATIENT TYPE:  INP   LOCATION:  4735                                 FACILITY:  [facility]   PHYSICIAN:  Rollene Rotunda, M.D.                DATE OF BIRTH:  Jun 07, 1930   DATE OF ADMISSION:  01/06/2004  DATE OF DISCHARGE:  01/09/2004                                 DISCHARGE SUMMARY   PROCEDURES:  1. Right heart catheterization.  2. Left heart catheterization.  3. Coronary arteriogram.  4. Left ventriculogram.  5. CT of the chest.   HOSPITAL COURSE:  Mr. Bilodeau is a 77 year old male with a history of  nonobstructive coronary artery disease.  His last Cardiolite was in June of  2004 which showed an EF of 53% and possible scar.  Medical management was  indicated.  He was referred for a sleep study and documented to have severe  sleep apnea but could not tolerate CPAP.  He was seen in the office on  January 06, 2004 and because of chest discomfort and shortness of breath, he  was admitted for further evaluation and treatment.   His enzymes were negative for MI and a cardiac catheterization was  indicated, which was performed on January 09, 2004.  Right heart  catheterization was also performed.  Cardiac catheterization showed  nonobstructive coronary artery disease in the RCA and LAD.  He had mild  disease of the circumflex OM system.  His PAS was 35.  Dr. Veneda Melter  evaluated this data and felt that he had mild pulmonary hypertension and  nonobstructive coronary artery disease with normal cardiac output and normal  left ventricular function.  No further workup was indicated at this time.   Because of his history of sleep apnea and his intolerance to CPAP, overnight  oximetry indicated a CP pulse oximetry on home O2.  This was not performed  and will be done as an outpatient.  He does not currently qualify for home  O2, based on his O2 saturations.   Mr. Jawad  came to the hospital on the medications that included  hydrochlorothiazide 12.5 mg daily and diclofenac 75 mg b.i.d.  Both of these  medications were held and not restarted at discharge.  He is not to take  Imdur for now as well.  He was on Toprol 100 mg prior to admission, but this  was decreased to 50 mg and this dosage will be continued.  In the hospital,  his systolic blood pressure was between 110 and 130.  His heart rate was  generally in the 50s and 60s, although occasionally increased to the 70s.  He is to follow up as an outpatient with Dr. Rollene Rotunda, who is his  primary care physician, and further medication adjustments will be made at  that time.   Post catheterization, Mr. Rossin was stable and ambulating without chest  pain and his shortness of breath was at baseline.  He was considered stable  for discharge on January 09, 2004 and is to follow up as an outpatient.   Chest CT:  No large central or pulmonary emboli are seen.  This will not  completely exclude small pulmonary emboli as somewhat suboptimal  opacification of the pulmonary arteries.  Imaging through the upper abdomen  shows probable fatty infiltration of the liver.   Hemoglobin 13.0, hematocrit 38.4, WBC 6.1, platelets 192,000.  Sodium 138,  potassium 4.1, chloride 103, CO2 29, BUN 13, creatinine 1.3, glucose 108.   DISCHARGE CONDITION:  Improved.   DISCHARGE DIAGNOSES:  1. Chest pain, no critical coronary artery disease by catheterization.  2. Shortness of breath, overnight oximetry planned in followup with     cardiology as well as primary care.  3. Status post appendectomy, tonsillectomy and knee surgery.  4. Status post cardiac catheterization, this admission, with nonobstructive     coronary artery disease, PAS of 35 and an EF greater than 55% with no     mitral regurgitation.  5. Hypertension.  6. Sleep apnea.  7. Lumbar and cervical disk disease.  8. Gastroesophageal reflux disease.  9.  Hyperlipidemia.  10.      History of deep venous thrombosis in 1999.  11.      History of thyroid enlargement, followed by Dr. Angelia Mould. Derrell Lolling.   DISCHARGE INSTRUCTIONS:  1. His activity level is to include no driving or strenuous activity for 2     days.  2. He is to stick to a low-fat and -salt diet.  3. He is to call the office for followup of the cath site.  4. He is to follow up with Dr. Antoine Poche and the office will call.  5. He is to follow up with Dr. Ernestina Penna as needed.   DISCHARGE MEDICATIONS:  1. Terazosin 2 mg nightly.  2. Aspirin 81 mg daily.  3. Simvastatin 40 mg daily.  4. Toprol-XL 50 mg daily.  5. Alprazolam as prior to admission.   SPECIAL DISCHARGE INSTRUCTIONS:  He is not to take Toprol 100 mg, Imdur,  hydrochlorothiazide or diclofenac for now.      Theodore Demark, P.A. [facility]                  Rollene Rotunda, M.D.    RB/MEDQ  D:  01/09/2004  T:  01/10/2004  Job:  [id number]   cc:   Ernestina Penna, M.D.  [address] [place]  [place] [address]  Fax: [phone]   Rollene Rotunda, M.D.

## 2011-05-17 NOTE — Procedures (Signed)
[facility]  Patient:    [name], [name]                 MRN: [id number] Proc. Date: 09/23/00 Adm. Date:  [id number] Attending:  Thyra Breed CC:         Rudi Heap, M.D., [facility]. Phy.   Procedure Report  PROCEDURE:  Lumbar epidural steroid injection.  DIAGNOSIS:  Low back pain on the basis of lumbar spondylosis.  INTERVAL HISTORY:  The patient was seen back on August 25, 2000 at which time his blood pressure was markedly elevated and he has subsequently seen Dr. Christell Tavian who brought is blood pressure under better control. Today its 157/95. He has been placed on Toprol 150 mg a day. He continues to have his back discomfort as much as previously.  PHYSICAL EXAMINATION:  VITAL SIGNS:  Stable except for mildly elevated blood pressures. Please see flow sheet.  NEUROLOGIC:  Unchanged from August 25, 2000.  DESCRIPTION OF PROCEDURE:  After informed consent was obtained, the patient was placed in the sitting position and monitored. The patients back was prepped with Betadine x 3. A skin wheal was raised at the L4-5 interspace with 1 percent lidocaine. A 20 gauge Tuohy needle was introduced to the lumbar epidural space to loss of resistance to preservative free normal saline. There was no cerebrospinal fluid nor blood. 80 mg of Medrol and 10 ml of preservative free normal saline was gently injected. The needle was flushed with preservative free normal saline and removed intact.  CONDITION POST PROCEDURE:  Stable.  DISCHARGE INSTRUCTIONS:  Resume previous diet. Limitations in activities per instruction sheet. Continue on current medications. Follow-up with me in 1 week for a second injection. We will reassess his blood pressure at that time to reassure that is maintaining under control. DD:  09/23/00 TD:  09/23/00 Job: [id number]

## 2011-05-17 NOTE — Consult Note (Signed)
[facility]  Patient:    [name], [name]                 MRN: [id number] Proc. Date: 08/25/00 Adm. Date:  [id number] Attending:  Thyra Breed CC:         Monica Becton, M.D., [facility]   Consultation Report  REEVALUATION FOR EPIDURAL STEROID INJECTIONS:  The patient was last seen in October of 2000 when he received his last series of lumbar epidural steroid injection for facet joint arthropathy and degenerative joint disease of the lumbar disks.  Since then, he has had recurrence of his pain.  He states it is identical to his previous pain.  It intermittently radiates out into the right lower extremity.  He continues to have some pseudoclaudication.  The patient is not taking his antihypertensive, Toprol, like he has been in the past.  He actually is only taking aspirin.  ALLERGIES:  No known drug allergies.  REVIEW OF SYSTEMS:  The patient denies any bowel or bladder incontinence, weakness or numbness and tingling.  EXAMINATION  VITAL SIGNS:  Initial blood pressure 174/104; on repeat, it was 183/108. Heart rate is 92, respiratory rate is 14, O2 saturation is 98%, pain level is 8/10 and temperature is 97.6.  BACK:  His back shows no tenderness to percussion over the vertebrae.  NEUROLOGIC:  His straight leg raise signs are negative today.  Deep tendon reflexes are symmetric in the lower extremities.  Motor is 5/5.  IMPRESSION: 1. Low back pain on the basis of lumbar spondylosis, with recurrence of his    discomfort. 2. Hypertension, for which he is not currently taking any medications, which    is poorly controlled.  DISPOSITION:  The patient was advised that he really needed to get his pressure under control before we could proceed with a repeat lumbar epidural steroid injection.  I advised him that we would hold off today, until he has had a chance to get back on his antihypertensives. DD:   08/25/00 TD:  08/25/00 Job: [id number]

## 2011-05-17 NOTE — Cardiovascular Report (Signed)
NAMEMATHIS, CASHMAN                       ACCOUNT NO.:  [id number]   MEDICAL RECORD NO.:  [id number]                   PATIENT TYPE:  INP   LOCATION:  4735                                 FACILITY:  [facility]   PHYSICIAN:  Veneda Melter, M.D.                   DATE OF BIRTH:  January 05, 1930   DATE OF PROCEDURE:  01/09/2004  DATE OF DISCHARGE:                              CARDIAC CATHETERIZATION   PROCEDURES PERFORMED:  1. Right heart catheterization.  2. Left heart catheterization.  3. Left ventriculogram.  4. Selective coronary angiography.   DIAGNOSES:  1. Mild coronary artery disease.  2. Normal left ventricular systolic function.  3. Mild pulmonary hypertension.  4. Normal cardiac output.   HISTORY:  Mr. Oaxaca is a 77 year old gentleman with a history of mild  coronary artery disease by catheterization in 2003 with sleep apnea who  presents for further assessment.  The patient underwent stress imaging study  in June 2004 showing inferior defect with mild peri-infarct ischemia.  Unfortunately, he has had persistent of symptoms prompting his presentation.   TECHNIQUE:  Informed consent was obtained.  The patient was brought to the  catheterization laboratory.  A 6-French arterial and 8-French venous sheath  were placed in the right femoral groin using modified Seldinger technique.  A 7.5-French PA catheter was advanced to the pulmonary artery.  Appropriate  right-sided hemodynamics were obtained.  A 6-French pigtail catheter was  then advanced in the left ventricle and appropriate left-sided hemodynamics  were obtained.  Subsequently, a 6-French JL4 and JR4 catheters were used to  engage the left and right coronary arteries and selective angiography  performed in various projections using manual injections of contrast.  At  the termination of case catheters and sheath were removed and manual  pressure applied adequate hemostasis achieved.  The patient tolerated  procedure well  and was transferred to floor in stable condition.   FINDINGS:  1. Right heart catheterization:     a. RA equals 17/15; mean equals 15.     b. RV equals 35/14.     c. PA equals 35/22.     d. Pulmonary capillary wedge pressure is 24/22; mean equals 21.     e. Cardiac output was 5.3 L/minute with cardiac index of 2.4 L/minute/sq        m by Fick method.     f. PA saturation 63%.     g. Aortic saturation 90%.     h. Hemoglobin is 13.0 mg/dl.  2. Left heart catheterization:     a. Left main trunk:  Large caliber vessel with mild irregularities.     b. LAD:  This is a medium caliber vessel.  Provides two large diagonal        branches in the proximal segment.  The LAD has moderate disease of 30-        40% in the proximal to mid section with  calcification.  The distal LAD        has diffuse disease of 30%.  The first diagonal branch has mild        disease of 30%.  The second diagonal branch has an ostial narrowing of        40%.     c. Left circumflex artery:  This is a medium caliber vessel.  Provides        three small marginal branches in the mid section.  Left circumflex        system has mild irregularities.     d. Right coronary artery:  Dominant.  This is a medium caliber vessel.        Provides posterior descending artery and small posterior ventricular        branch in terminal segment.  The right coronary artery has moderate        tubular narrowing of 40% in the proximal segment with further disease        of 30% in the mid and distal sections.     e. LV:  Normal end-systolic and end-diastolic dimensions.  Overall left        ventricular function is well preserved.  Ejection fraction greater        than 55%.  No mitral regurgitation.  LV pressure is 119/7.  Aortic is        122/74.  LVEDP equals 13.   ASSESSMENT/PLAN:  Mr. Ryans is a 77 year old gentleman with noncritical  coronary artery disease and well preserved left ventricular function.  He  has mild pulmonary  hypertension and history of sleep apnea.  Continued  medical therapy will be recommended.                                               Veneda Melter, M.D.    NG/MEDQ  D:  01/09/2004  T:  01/09/2004  Job:  [id number]   cc:   Ernestina Penna, M.D.  [address] [place]  [place] [address]  Fax: [phone]

## 2011-05-17 NOTE — H&P (Signed)
NAMEBERTHEL, BAGNALL                       ACCOUNT NO.:  [id number]   MEDICAL RECORD NO.:  [id number]                   PATIENT TYPE:  OUT   LOCATION:  RAD                                  FACILITY:  [facility]   PHYSICIAN:  Rollene Rotunda, M.D.                DATE OF BIRTH:  [birth year]   DATE OF ADMISSION:  12/29/2003  DATE OF DISCHARGE:  12/29/2003                                HISTORY & PHYSICAL   REASON FOR ADMISSION:  Evaluate patient with chest pain.   HISTORY OF PRESENT ILLNESS:  The patient is a 77 year old gentleman with  previous nonobstructive coronary disease.  I have been following him over  the past year for increasing shortness of breath.  We did a stress perfusion  study in June of 2004.  This suggested non-transmural inferior wall scar  with superimposed ischemia.  His ejection fraction was 63%.  At that time we  decided to manage this medically.  He was having shortness of breath and I  did refer him for a sleep study.  He was documented to have severe sleep  apnea.  However, he could not tolerate the CPAP.  It was then decided that  he should get overnight pulse oximetry and when qualified should be  prescribed home oxygen.  We did document saturations had dropped overnight,  however, he has not yet been able to get the oxygen.  We were bringing him  back today to reproduce his oxygen saturation  measurements so we could  present this to the home oxygen company.   When he came to the office he was describing increasing shortness of breath.  He says that he is having a tightness in his chest.  This he says feels like  his heart is stressed.  It has been a continuous discomfort that waxes and  wanes in intensity.  He also has some sharp discomfort that is more  shooting.  He feels it in his right chest.  He does have some expiratory  wheezing.  His oxygen saturation was in the mid 90's and stayed in the low  90's with ambulation in the office.  However, subjectively  he is dyspneic.  He is not describing paroxysmal nocturnal dyspnea or orthopnea.  He gets up  multiple times through the night to go to the bathroom and does not sleep  well.  He looks quite distressed and anxious.  He is hypertensive.  He is  not describing any radiation to his arms.  He is not having any associated  nausea and vomiting.  He says his symptoms have been very progressive over  the last two to three weeks.   PAST MEDICAL HISTORY:  Non-obstructive coronary artery disease  (catheterization in 2002 with 30 to 40% stenosis in mid left anterior  descending, diagonal with 30 to 40% stenosis, circumflex 30 to 40% stenosis,  right coronary artery 30% stenosis, ejection fraction 65%), hypertension,  sleep apnea, lumbar and cervical disc disease, gastroesophageal reflux  disease, hyperlipidemia, deep venous thrombosis in 1999, thyroid enlargement  followed by Dr. Derrell Lolling.   PAST SURGICAL HISTORY:  Appendectomy, tonsillectomy, knee surgery.   ALLERGIES:  MORPHINE causes a rash.  VIOXX and CELEBREX cause swelling,  MOTRIN caused gastrointestinal upset.   MEDICATIONS:  1. Hydrochlorothiazide 12.5 mg daily.  2. Diclofenac 75 mg b.i.d.  3. Terazosin 2 mg q.h.s.  4. Aspirin 81 mg daily.  5. _________ 20 mg daily.  6. Simvastatin 40 mg q.h.s.  7. Metoprolol 100 mg daily.  8. Isosorbide 30 mg daily.   SOCIAL HISTORY:  The patient lives in [place] alone.  He is retired.  He has  been divorced X2.  He has three children.  He is currently not smoking.  He  does drink Vodka.   FAMILY HISTORY:  Is noncontributory for early coronary artery disease.   REVIEW OF SYMPTOMS:  As stated in the history of present illness and  otherwise negative for other systems.   PHYSICAL EXAMINATION:  GENERAL:  The patient appears quite anxious.  He is  somewhat uncomfortable.  VITAL SIGNS:  162/100, pulse 56 and regular.  HEENT:  Eyes unremarkable, pupils equal, round, reactive to light, fundi not   visualized.  Oral mucosa unremarkable.  NECK:  No jugular venous distention.  Wave form within normal limits.  Carotid upstroke brisk and symmetrical, no bruits, no thyromegaly.  LYMPHATICS:  No cervical, axillary, inguinal adenopathy.  LUNGS:  Upper airway expiratory wheezes, no crackles.  BACK:  No costovertebral angle tenderness.  CHEST:  Unremarkable.  HEART:  PMI  not displaced or sustained.  S1, S2 within normal limits.  No  S3, no S4, no murmurs.  ABDOMEN:  Flat, positive bowel sounds, no in frequency and pitch.  No  bruits, rebound, guarding, no midline pulsatile mass, no organomegaly.  SKIN:  No rashes, nodules.  EXTREMITIES:  2+ pulses throughout.  No edema.  No cyanosis or clubbing.  NEUROLOGICAL:  Oriented to person, place and time.  The cranial nerves II-  XII are grossly intact.  Motor grossly intact.   CLINICAL DATA:  Electrocardiogram with sinus rhythm, rate 60, axis within  normal limits, intervals within normal limits, no acute ST-T wave changes.   ASSESSMENT/PLAN:  1. Chest discomfort.  The patient's chest discomfort is worrisome for     increasing exertional angina.  He did have an abnormal Cardiolite in the     past which suggested perhaps a progression of right coronary artery     disease or circumflex coronary disease.  He is more dyspneic over the     last of weeks.  I will treat him for treatment of unstable angina.  He     will need a left and right heart catheterization.  Will also check a D-     dimer and if it is at all elevated I would get a spiral CT scan.  Will     empirically treat him with heparin and nitrates.  2. Sleep apnea.  He was not able to tolerate CPAP.  I will get overnight     pulse oximetry to confirm drops in oxygen saturations and try to arrange     home oxygen through the hospital.  3. Anxiety.  Will give him benzodiazepines as needed.  4. Hypertension.  He will continue the medications as listed.  Rollene Rotunda, M.D.    JH/MEDQ  D:  01/06/2004  T:  01/06/2004  Job:  [id number]

## 2011-05-28 ENCOUNTER — Encounter (HOSPITAL_COMMUNITY): Payer: Medicare Other

## 2011-05-30 ENCOUNTER — Ambulatory Visit (HOSPITAL_COMMUNITY)
Admission: RE | Admit: 2011-05-30 | Discharge: 2011-05-30 | Disposition: A | Discharge status: Home / Self Care | Payer: Medicare Other | Source: Ambulatory Visit | Attending: Ophthalmology | Admitting: Ophthalmology

## 2011-05-30 DIAGNOSIS — Z79899 Other long term (current) drug therapy: Secondary | ICD-10-CM | POA: Insufficient documentation

## 2011-05-30 DIAGNOSIS — H2510 Age-related nuclear cataract, unspecified eye: Secondary | ICD-10-CM | POA: Insufficient documentation

## 2011-05-30 DIAGNOSIS — I10 Essential (primary) hypertension: Secondary | ICD-10-CM | POA: Insufficient documentation

## 2011-05-30 DIAGNOSIS — Z7982 Long term (current) use of aspirin: Secondary | ICD-10-CM | POA: Insufficient documentation

## 2011-05-30 DIAGNOSIS — E119 Type 2 diabetes mellitus without complications: Secondary | ICD-10-CM | POA: Insufficient documentation

## 2011-05-30 LAB — GLUCOSE, CAPILLARY: Glucose-Capillary: 132 mg/dL — ABNORMAL HIGH (ref 70–99)

## 2011-06-10 NOTE — Op Note (Signed)
  NAME:  [name], [name]          ACCOUNT NO.:  [id number]  MEDICAL RECORD NO.:  [id number]           PATIENT TYPE:  O  LOCATION:  DAYP                          FACILITY:  [facility]  PHYSICIAN:  Susanne Greenhouse, MD       DATE OF BIRTH:  1930/05/02  DATE OF PROCEDURE:  05/30/2011 DATE OF DISCHARGE:                              OPERATIVE REPORT   PREOPERATIVE DIAGNOSIS:  Nuclear cataract, left eye, diagnosis code 366.16.  POSTOPERATIVE DIAGNOSIS:  Nuclear cataract, left eye, diagnosis code 366.16.  PROSTHETIC DEVICE USED:  Lenstec posterior chamber lens, model Softec HD, power of 19.5, serial number is [id number].          ______________________________ Susanne Greenhouse, MD     KEH/MEDQ  D:  05/30/2011  T:  05/31/2011  Job:  [id number]  Electronically Signed by Gemma Payor MD on 06/10/2011 12:20:50 PM

## 2011-06-10 NOTE — Op Note (Signed)
  NAME:  [name], [name]          ACCOUNT NO.:  [id number]  MEDICAL RECORD NO.:  [id number]           PATIENT TYPE:  O  LOCATION:  DAYP                          FACILITY:  [facility]  PHYSICIAN:  Susanne Greenhouse, MD       DATE OF BIRTH:  07-Aug-1930  DATE OF PROCEDURE:  05/02/2011 DATE OF DISCHARGE:  05/02/2011                              OPERATIVE REPORT   PREOPERATIVE DIAGNOSIS:  Nuclear cataract, right eye.  POSTOPERATIVE DIAGNOSIS:  Nuclear cataract, right eye.  DIAGNOSIS CODE:  366.16.  OPERATION PERFORMED:  Phacoemulsification with posterior chamber intraocular lens implantation, right eye.  SURGEON:  Bonne Dolores. Lulani Bour, MD  ANESTHESIA:  General endotracheal anesthesia.  OPERATIVE SUMMARY:  In the preoperative area, dilating drops were placed into the right eye.  The patient was then brought into the operating room where he was placed under general anesthesia.  The eye was then prepped and draped.  Beginning with a 75 blade, a paracentesis port was made at the surgeon's 2 o'clock position.  The anterior chamber was then filled with a 1% nonpreserved lidocaine solution with epinephrine.  This was followed by Viscoat to deepen the chamber.  A small fornix-based peritomy was performed superiorly.  Next, a single iris hook was placed through the limbus superiorly.  A 2.4-mm keratome blade was then used to make a clear corneal incision over the iris hook.  A bent cystotome needle and Utrata forceps were used to create a continuous tear capsulotomy.  Hydrodissection was performed using balanced salt solution on a fine cannula.  The lens nucleus was then removed using phacoemulsification in a quadrant cracking technique.  The cortical material was then removed with irrigation and aspiration.  The capsular bag and anterior chamber were refilled with Provisc.  The wound was widened to approximately 3 mm and a posterior chamber intraocular lens was placed into the capsular bag without  difficulty using an [employer] lens injecting system.  A single 10-0 nylon suture was then used to close the incision as well as stromal hydration.  The Provisc was removed from the anterior chamber and capsular bag with irrigation and aspiration.  At this point, the wounds were tested for leak, which were negative.  The anterior chamber remained deep and stable.  The patient tolerated the procedure well.  There were no operative complications, and he awoke from general anesthesia without problem.  Prosthetic device used is a Lenstec posterior chamber lens, model Softec HD, power of 19.0, serial number is [id number].          ______________________________ Susanne Greenhouse, MD     KEH/MEDQ  D:  05/03/2011  T:  05/03/2011  Job:  [id number]  Electronically Signed by Gemma Payor MD on 06/10/2011 12:20:45 PM

## 2011-09-30 LAB — DIFFERENTIAL
Lymphocytes Relative: 40
Lymphs Abs: 3.9
Monocytes Absolute: 1.1 — ABNORMAL HIGH
Monocytes Relative: 11
Neutro Abs: 4.8
Neutrophils Relative %: 48

## 2011-09-30 LAB — URINALYSIS, ROUTINE W REFLEX MICROSCOPIC
Ketones, ur: NEGATIVE
Nitrite: NEGATIVE
Specific Gravity, Urine: 1.03
pH: 5.5

## 2011-09-30 LAB — POCT I-STAT, CHEM 8
Calcium, Ion: 1.09 — ABNORMAL LOW
Chloride: 102
HCT: 46
Potassium: 4.7
Sodium: 136

## 2011-09-30 LAB — CBC
Hemoglobin: 15.2
RBC: 4.87
WBC: 9.9

## 2011-09-30 LAB — PROTIME-INR: INR: 1.1

## 2011-10-01 LAB — GLUCOSE, CAPILLARY
Glucose-Capillary: 152 — ABNORMAL HIGH
Glucose-Capillary: 122 mg/dL — ABNORMAL HIGH (ref 70–99)
Glucose-Capillary: 132 mg/dL — ABNORMAL HIGH (ref 70–99)
Glucose-Capillary: 132 mg/dL — ABNORMAL HIGH (ref 70–99)
Glucose-Capillary: 133 mg/dL — ABNORMAL HIGH (ref 70–99)
Glucose-Capillary: 144 mg/dL — ABNORMAL HIGH (ref 70–99)
Glucose-Capillary: 145 mg/dL — ABNORMAL HIGH (ref 70–99)
Glucose-Capillary: 149 mg/dL — ABNORMAL HIGH (ref 70–99)
Glucose-Capillary: 151 mg/dL — ABNORMAL HIGH (ref 70–99)
Glucose-Capillary: 151 mg/dL — ABNORMAL HIGH (ref 70–99)
Glucose-Capillary: 155 mg/dL — ABNORMAL HIGH (ref 70–99)
Glucose-Capillary: 162 mg/dL — ABNORMAL HIGH (ref 70–99)
Glucose-Capillary: 201 mg/dL — ABNORMAL HIGH (ref 70–99)
Glucose-Capillary: 211 mg/dL — ABNORMAL HIGH (ref 70–99)
Glucose-Capillary: 218 mg/dL — ABNORMAL HIGH (ref 70–99)
Glucose-Capillary: 237 mg/dL — ABNORMAL HIGH (ref 70–99)
Glucose-Capillary: 118 mg/dL — ABNORMAL HIGH (ref 70–99)
Glucose-Capillary: 132 mg/dL — ABNORMAL HIGH (ref 70–99)
Glucose-Capillary: 135 mg/dL — ABNORMAL HIGH (ref 70–99)
Glucose-Capillary: 135 mg/dL — ABNORMAL HIGH (ref 70–99)
Glucose-Capillary: 143 mg/dL — ABNORMAL HIGH (ref 70–99)
Glucose-Capillary: 144 mg/dL — ABNORMAL HIGH (ref 70–99)
Glucose-Capillary: 147 mg/dL — ABNORMAL HIGH (ref 70–99)
Glucose-Capillary: 151 mg/dL — ABNORMAL HIGH (ref 70–99)
Glucose-Capillary: 161 mg/dL — ABNORMAL HIGH (ref 70–99)
Glucose-Capillary: 185 mg/dL — ABNORMAL HIGH (ref 70–99)
Glucose-Capillary: 186 mg/dL — ABNORMAL HIGH (ref 70–99)
Glucose-Capillary: 221 mg/dL — ABNORMAL HIGH (ref 70–99)

## 2011-10-01 LAB — CARDIAC PANEL(CRET KIN+CKTOT+MB+TROPI)
CK, MB: 0.6
CK, MB: 0.6
Total CK: 26
Troponin I: 0.02

## 2011-10-01 LAB — CBC
HCT: 42.9
MCHC: 34.4
MCV: 91.9
Platelets: 100 — ABNORMAL LOW
RBC: 4.66
WBC: 8.9

## 2011-10-01 LAB — COMPREHENSIVE METABOLIC PANEL
BUN: 53 — ABNORMAL HIGH
CO2: 27
Chloride: 102
Creatinine, Ser: 1.81 — ABNORMAL HIGH
GFR calc non Af Amer: 37 — ABNORMAL LOW
Total Bilirubin: 1.2

## 2011-10-01 LAB — LIPID PANEL
HDL: 34 — ABNORMAL LOW
Triglycerides: 119
VLDL: 24

## 2011-10-01 LAB — DIFFERENTIAL
Basophils Absolute: 0
Lymphocytes Relative: 40
Neutro Abs: 4.6

## 2011-10-01 LAB — B-NATRIURETIC PEPTIDE (CONVERTED LAB): Pro B Natriuretic peptide (BNP): <30

## 2011-10-01 LAB — PHOSPHORUS: Phosphorus: 3.7

## 2011-10-01 LAB — T4, FREE: Free T4: 1.75

## 2011-10-01 LAB — MAGNESIUM: Magnesium: 2.6 — ABNORMAL HIGH

## 2011-10-01 LAB — HEMOGLOBIN A1C
Hgb A1c MFr Bld: 7.1 — ABNORMAL HIGH
Mean Plasma Glucose: 157

## 2011-10-01 LAB — T3, FREE: T3, Free: 3.4 (ref 2.3–4.2)

## 2011-11-08 ENCOUNTER — Encounter: Payer: Self-pay | Admitting: Cardiology

## 2011-11-08 ENCOUNTER — Ambulatory Visit (INDEPENDENT_AMBULATORY_CARE_PROVIDER_SITE_OTHER): Payer: Medicare Other | Admitting: Cardiology

## 2011-11-08 DIAGNOSIS — E785 Hyperlipidemia, unspecified: Secondary | ICD-10-CM

## 2011-11-08 DIAGNOSIS — I2510 Atherosclerotic heart disease of native coronary artery without angina pectoris: Secondary | ICD-10-CM | POA: Insufficient documentation

## 2011-11-08 DIAGNOSIS — E119 Type 2 diabetes mellitus without complications: Secondary | ICD-10-CM | POA: Insufficient documentation

## 2011-11-08 DIAGNOSIS — I10 Essential (primary) hypertension: Secondary | ICD-10-CM

## 2011-11-08 NOTE — Patient Instructions (Signed)
Your physician has requested that you have a lexiscan myoview. For further information please visit [email or web address] Please follow instruction sheet, as given.  The current medical regimen is effective;  continue present plan and medications.  Follow up with Dr Antoine Poche after your testing has been completed.

## 2011-11-08 NOTE — Assessment & Plan Note (Signed)
The blood pressure is at target. No change in medications is indicated. We will continue with therapeutic lifestyle changes (TLC).  

## 2011-11-08 NOTE — Assessment & Plan Note (Signed)
Given his nonobstructive CAD and chest pain he needs stress testing.  He would not be able to walk on a treadmill.  I will schedule a Lexiscan Myoview.

## 2011-11-08 NOTE — Progress Notes (Signed)
HPI The patient presents for followup of coronary artery disease. I last saw him about 4 years ago. He had a catheterization several years ago with nonobstructive disease. He's now quite limited by back and joint pains. He has an unsteady gait and walks with a cane. With this he denies any chest discomfort. However, he does get sporadic chest discomfort almost daily without activity. He describes some sharp discomfort. He points to his left breast. He doesn't describe any radiation to his jaw to his arms. He doesn't describe any associated shortness of breath, PND or orthopnea. However, he is dyspneic with activities. He doesn't describe palpitations, presyncope or syncope. He does get quite anxious.  Allergies  Allergen Reactions  . Bactrim   . Flagyl (Metronidazole Hcl)   . Gabapentin   . Lyrica (Pregabalin)   . Morphine And Related   . Motrin (Ibuprofen)   . Nsaids   . Ultram (Tramadol Hcl)   . Celebrex (Celecoxib) Rash    Current Outpatient Prescriptions  Medication Sig Dispense Refill  . amLODipine (NORVASC) 5 MG tablet Take 5 mg by mouth daily.        Marland Kitchen aspirin 81 MG tablet Take 81 mg by mouth daily.        Marland Kitchen BYSTOLIC 10 MG tablet Take 10 mg by mouth daily.       . Cholecalciferol (VITAMIN D3) 2000 UNITS capsule Take 2,000 Units by mouth daily.        . Diphenhydramine-APAP, sleep, (PAIN RELIEF PM EXTRA STRENGTH PO) Take 500 mg by mouth as needed.        . docusate sodium (COLACE) 50 MG capsule Take by mouth. 3 tablets in the morning, 3 at night       . fentaNYL (DURAGESIC - DOSED MCG/HR) 25 MCG/HR Place 1 patch onto the skin every 3 (three) days.        . ferrous fumarate-iron polysaccharide complex (TANDEM) 162-115.2 MG CAPS Take 1 capsule by mouth daily with breakfast.        . finasteride (PROSCAR) 5 MG tablet Take 5 mg by mouth daily.        . fish oil-omega-3 fatty acids 1000 MG capsule Take 2 g by mouth 5 (five) times daily.        . furosemide (LASIX) 40 MG tablet Take 40 mg  by mouth daily.       . metFORMIN (GLUMETZA) 500 MG (MOD) 24 hr tablet Take 500 mg by mouth daily. As directed BID       . multivitamin (THERAGRAN) per tablet Take 1 tablet by mouth daily.        Marland Kitchen omeprazole (PRILOSEC) 20 MG capsule Take 20 mg by mouth daily.       . ramipril (ALTACE) 10 MG tablet Take 10 mg by mouth daily.        . rosuvastatin (CRESTOR) 10 MG tablet Take 10 mg by mouth daily.        Marland Kitchen terazosin (HYTRIN) 10 MG capsule Take 10 mg by mouth daily.        . TRADJENTA 5 MG TABS tablet Take 5 mg by mouth daily.         No past medical history on file.  No past surgical history on file.  No family history on file.  History   Social History  . Marital Status: Divorced    Spouse Name: N/A    Number of Children: N/A  . Years of Education: N/A   Occupational History  .  Not on file.   Social History Main Topics  . Smoking status: Former Smoker    Types: Cigarettes, Pipe, Cigars    Quit date: 11/08/2003  . Smokeless tobacco: Former Neurosurgeon    Quit date: 11/08/2003  . Alcohol Use: No  . Drug Use: No  . Sexually Active: Not on file   Other Topics Concern  . Not on file   Social History Narrative  . No narrative on file    ROS:  PHYSICAL EXAM BP 108/66  Pulse 65  Ht 5\' 8"  (1.727 m)  Wt 236 lb (107.049 kg)  BMI 35.88 kg/m2 GENERAL:  Well appearing HEENT:  Pupils equal round and reactive, fundi not visualized, oral mucosa unremarkable NECK:  No jugular venous distention, waveform within normal limits, carotid upstroke brisk and symmetric, no bruits, no thyromegaly LYMPHATICS:  No cervical, inguinal adenopathy LUNGS:  Clear to auscultation bilaterally BACK:  No CVA tenderness CHEST:  Unremarkable HEART:  PMI not displaced or sustained,S1 and S2 within normal limits, no S3, no S4, no clicks, no rubs, no murmurs ABD:  Flat, positive bowel sounds normal in frequency in pitch, no bruits, no rebound, no guarding, no midline pulsatile mass, no hepatomegaly, no  splenomegaly, obese EXT:  2 plus pulses throughout, no edema, no cyanosis no clubbing SKIN:  No rashes no nodules NEURO:  Cranial nerves II through XII grossly intact, motor grossly intact throughout PSYCH:  Cognitively intact, oriented to person place and time  EKG: Sinus rhythm, rate 65, axis within normal limits, intervals within normal limits, no acute ST-T wave changes.   ASSESSMENT AND PLAN

## 2011-11-08 NOTE — Assessment & Plan Note (Signed)
I will defer to his primary providers.

## 2011-11-12 ENCOUNTER — Ambulatory Visit: Payer: Medicare Other | Admitting: Cardiovascular Disease

## 2011-11-13 ENCOUNTER — Ambulatory Visit (HOSPITAL_COMMUNITY): Payer: Medicare Other | Attending: Cardiology | Admitting: Radiology

## 2011-11-13 VITALS — Ht 68.0 in | Wt 236.0 lb

## 2011-11-13 DIAGNOSIS — R0609 Other forms of dyspnea: Secondary | ICD-10-CM | POA: Insufficient documentation

## 2011-11-13 DIAGNOSIS — I2510 Atherosclerotic heart disease of native coronary artery without angina pectoris: Secondary | ICD-10-CM | POA: Insufficient documentation

## 2011-11-13 DIAGNOSIS — E119 Type 2 diabetes mellitus without complications: Secondary | ICD-10-CM | POA: Insufficient documentation

## 2011-11-13 DIAGNOSIS — R0602 Shortness of breath: Secondary | ICD-10-CM

## 2011-11-13 DIAGNOSIS — Z87891 Personal history of nicotine dependence: Secondary | ICD-10-CM | POA: Insufficient documentation

## 2011-11-13 DIAGNOSIS — E785 Hyperlipidemia, unspecified: Secondary | ICD-10-CM | POA: Insufficient documentation

## 2011-11-13 DIAGNOSIS — I779 Disorder of arteries and arterioles, unspecified: Secondary | ICD-10-CM | POA: Insufficient documentation

## 2011-11-13 DIAGNOSIS — I10 Essential (primary) hypertension: Secondary | ICD-10-CM | POA: Insufficient documentation

## 2011-11-13 DIAGNOSIS — R0989 Other specified symptoms and signs involving the circulatory and respiratory systems: Secondary | ICD-10-CM | POA: Insufficient documentation

## 2011-11-13 MED ORDER — TECHNETIUM TC 99M TETROFOSMIN IV KIT
33.0000 | PACK | Freq: Once | INTRAVENOUS | Status: AC | PRN
  Administered 2011-11-13: 33 via INTRAVENOUS

## 2011-11-13 MED ORDER — TECHNETIUM TC 99M TETROFOSMIN IV KIT
11.0000 | PACK | Freq: Once | INTRAVENOUS | Status: AC | PRN
  Administered 2011-11-13: 11 via INTRAVENOUS

## 2011-11-13 MED ORDER — REGADENOSON 0.4 MG/5ML IV SOLN
0.4000 mg | Freq: Once | INTRAVENOUS | Status: AC
  Administered 2011-11-13: 0.4 mg via INTRAVENOUS

## 2011-11-13 NOTE — Progress Notes (Signed)
[facility] SITE 3 NUCLEAR MED [address] [place] [address] [phone]  Cardiology Nuclear Med Study  [name] is a 77 y.o. male [id number] [birth year]   Nuclear Med Background Indication for Stress Test:  Evaluation for Ischemia History: '09 Echo; EF 55-60%, '05 Heart Catheterization: N/O CAD EF >55% mild pulmonary HTN and '07 Myocardial Perfusion Study: NL Cardiac Risk Factors: Carotid Disease, History of Smoking, Hypertension, Lipids and NIDDM  Symptoms:  DOE   Nuclear Pre-Procedure Caffeine/Decaff Intake:  None NPO After: 7:30am   Lungs:  clear IV 0.9% NS with Angio Cath:  20g  IV Site: R Antecubital  IV Started by:  Bonnita Levan, RN  Chest Size (in):  50 Cup Size: n/a  Height: 5\' 8"  (1.727 m)  Weight:  236 lb (107.049 kg)  BMI:  Body mass index is 35.88 kg/(m^2). Tech Comments:  Sports administrator, Metformin and Lasix this AM    Nuclear Med Study 1 or 2 day study: 1 day  Stress Test Type:  Lexiscan  Reading MD: Olga Millers, MD  Order Authorizing Provider:  Dr. Daiva Nakayama  Resting Radionuclide: Technetium 41m Tetrofosmin  Resting Radionuclide Dose: 11.0 mCi   Stress Radionuclide:  Technetium 76m Tetrofosmin  Stress Radionuclide Dose: 33.0 mCi           Stress Protocol Rest HR: 57 Stress HR: 75  Rest BP: 122/69 Stress BP: 129/68  Exercise Time (min): n/a METS: n/a   Predicted Max HR: 141 bpm % Max HR: 53.19 bpm Rate Pressure Product: 9675   Dose of Adenosine (mg):  n/a Dose of Lexiscan: 0.4 mg  Dose of Atropine (mg): n/a Dose of Dobutamine: n/a mcg/kg/min (at max HR)  Stress Test Technologist: Milana Na, EMT-P  Nuclear Technologist:  Doyne Keel, CNMT     Rest Procedure:  Myocardial perfusion imaging was performed at rest 45 minutes following the intravenous administration of Technetium 25m Tetrofosmin. Rest ECG: Sinus Bradycardia  Stress Procedure:  The patient received IV Lexiscan 0.4 mg over 15-seconds.   Technetium 63m Tetrofosmin injected at 30-seconds.  There were no significant changes with Lexiscan.  Quantitative spect images were obtained after a 45 minute delay. Stress ECG: No significant ST segment change suggestive of ischemia.  QPS Raw Data Images:  Acquisition technically good; normal left ventricular size. Stress Images:  There is decreased uptake in the inferior wall and apex. Rest Images:  There is decreased uptake in the inferior wall and apex. Subtraction (SDS):  No evidence of ischemia. Transient Ischemic Dilatation (Normal <1.22):  0.95 Lung/Heart Ratio (Normal <0.45):  0.33  Quantitative Gated Spect Images QGS EDV:  89 ml QGS ESV:  24 ml QGS cine images:  NL LV Function; NL Wall Motion QGS EF: 73%  Impression Exercise Capacity:  Lexiscan with no exercise. BP Response:  Normal blood pressure response. Clinical Symptoms:  There is chest pain. ECG Impression:  No significant ST segment change suggestive of ischemia. Comparison with Prior Nuclear Study: No images to compare  Overall Impression:  Low risk stress nuclear study with a small fixed inferior and apical defect suggestive of thinning vs prior infarct; no ischemia.  Olga Millers

## 2011-12-19 ENCOUNTER — Ambulatory Visit (INDEPENDENT_AMBULATORY_CARE_PROVIDER_SITE_OTHER): Payer: Medicare Other | Admitting: Cardiology

## 2011-12-19 ENCOUNTER — Encounter: Payer: Self-pay | Admitting: Cardiology

## 2011-12-19 VITALS — BP 110/70 | HR 60 | Resp 16 | Ht 68.0 in | Wt 241.0 lb

## 2011-12-19 DIAGNOSIS — I10 Essential (primary) hypertension: Secondary | ICD-10-CM

## 2011-12-19 DIAGNOSIS — IMO0002 Reserved for concepts with insufficient information to code with codable children: Secondary | ICD-10-CM

## 2011-12-19 DIAGNOSIS — I2510 Atherosclerotic heart disease of native coronary artery without angina pectoris: Secondary | ICD-10-CM

## 2011-12-19 DIAGNOSIS — I4891 Unspecified atrial fibrillation: Secondary | ICD-10-CM

## 2011-12-19 NOTE — Patient Instructions (Signed)
The current medical regimen is effective;  continue present plan and medications.  Follow up as needed 

## 2011-12-19 NOTE — Progress Notes (Signed)
HPI The patient presents for followup of coronary artery disease.  He had some atypical chest discomfort recently. However, stress perfusion study demonstrated no evidence of high-grade ischemia. He's had no chest discomfort since then. He is very limited by joint pains. He's not had any new shortness of breath, PND or orthopnea. I brought him back to discuss the results of the stress test it was not quite normal but was low risk.  Allergies  Allergen Reactions  . Bactrim   . Flagyl (Metronidazole Hcl)   . Gabapentin   . Lyrica (Pregabalin)   . Morphine And Related   . Motrin (Ibuprofen)   . Nsaids   . Ultram (Tramadol Hcl)   . Celebrex (Celecoxib) Rash    Current Outpatient Prescriptions  Medication Sig Dispense Refill  . amLODipine (NORVASC) 5 MG tablet Take 5 mg by mouth daily.        Marland Kitchen aspirin 81 MG tablet Take 81 mg by mouth daily.        Marland Kitchen BYSTOLIC 10 MG tablet Take 10 mg by mouth daily.       . Cholecalciferol (VITAMIN D3) 2000 UNITS capsule Take 2,000 Units by mouth daily.        . Diphenhydramine-APAP, sleep, (PAIN RELIEF PM EXTRA STRENGTH PO) Take 500 mg by mouth as needed.        . docusate sodium (COLACE) 50 MG capsule Take by mouth. 3 tablets in the morning, 3 at night       . fentaNYL (DURAGESIC - DOSED MCG/HR) 25 MCG/HR Place 1 patch onto the skin every 3 (three) days.        . finasteride (PROSCAR) 5 MG tablet Take 5 mg by mouth daily.        . fish oil-omega-3 fatty acids 1000 MG capsule Take 2 g by mouth 5 (five) times daily.        . furosemide (LASIX) 40 MG tablet Take 40 mg by mouth daily.       Marland Kitchen MAGNESIUM PO Take by mouth 4 (four) times daily.        . metFORMIN (GLUMETZA) 500 MG (MOD) 24 hr tablet Take 500 mg by mouth daily. Tid       . multivitamin (THERAGRAN) per tablet Take 1 tablet by mouth daily.        Marland Kitchen omeprazole (PRILOSEC) 20 MG capsule Take 20 mg by mouth daily.       . ramipril (ALTACE) 10 MG tablet Take 10 mg by mouth daily.        .  rosuvastatin (CRESTOR) 10 MG tablet Take 10 mg by mouth daily.        Marland Kitchen terazosin (HYTRIN) 10 MG capsule Take 10 mg by mouth daily.        . TRADJENTA 5 MG TABS tablet Take 5 mg by mouth daily.         Past Medical History  Diagnosis Date  . CAD (coronary artery disease)     (Left main normal, LAD 30-40% mid stenosis, diagonal 30-40% stenosis, circumflex 30-40% stenosis, right coronary artery 30-40% stenosis. 2003.)  . DVT (deep vein thrombosis) in pregnancy     1998  . Prostate cancer   . Sleep apnea     CPAP  . GERD (gastroesophageal reflux disease)   . Diabetes mellitus   . Hyperlipidemia   . HTN (hypertension)     Past Surgical History  Procedure Date  . Cholecystectomy   . Cataract extraction, bilateral   .  Tonsillectomy and adenoidectomy   . Appendectomy   . Inguinal hernia repair     Bilateral   ROS:  Joint pains.  Weakness.  Anxiety.  Otherwise as stated in the HPI and negative for all other systems.  PHYSICAL EXAM BP 110/70  Pulse 60  Resp 16  Ht 5\' 8"  (1.727 m)  Wt 241 lb (109.317 kg)  BMI 36.64 kg/m2 GENERAL:  Well appearing NECK:  No jugular venous distention, waveform within normal limits, carotid upstroke brisk and symmetric, no bruits, no thyromegaly LUNGS:  Clear to auscultation bilaterally BACK:  No CVA tenderness HEART:  PMI not displaced or sustained,S1 and S2 within normal limits, no S3, no S4, no clicks, no rubs, no murmurs ABD:  Flat, positive bowel sounds normal in frequency in pitch, no bruits, no rebound, no guarding, no midline pulsatile mass, no hepatomegaly, no splenomegaly, obese EXT:  2 plus pulses throughout, no edema, no cyanosis no clubbing  ASSESSMENT AND PLAN

## 2011-12-19 NOTE — Assessment & Plan Note (Signed)
There is no evidence for obstructive coronary disease. His symptoms are atypical. No further cardiovascular testing is suggested.

## 2011-12-19 NOTE — Assessment & Plan Note (Signed)
His blood pressure is controlled. He will continue with the medications as listed.

## 2012-09-21 ENCOUNTER — Emergency Department (HOSPITAL_COMMUNITY): Payer: Medicare Other

## 2012-09-21 ENCOUNTER — Emergency Department (HOSPITAL_COMMUNITY)
Admission: EM | Admit: 2012-09-21 | Discharge: 2012-09-21 | Disposition: A | Discharge status: Home / Self Care | Payer: Medicare Other | Attending: Emergency Medicine | Admitting: Emergency Medicine

## 2012-09-21 ENCOUNTER — Encounter (HOSPITAL_COMMUNITY): Payer: Self-pay | Admitting: *Deleted

## 2012-09-21 DIAGNOSIS — K5730 Diverticulosis of large intestine without perforation or abscess without bleeding: Secondary | ICD-10-CM | POA: Insufficient documentation

## 2012-09-21 DIAGNOSIS — R109 Unspecified abdominal pain: Secondary | ICD-10-CM | POA: Insufficient documentation

## 2012-09-21 DIAGNOSIS — R0602 Shortness of breath: Secondary | ICD-10-CM | POA: Insufficient documentation

## 2012-09-21 DIAGNOSIS — E119 Type 2 diabetes mellitus without complications: Secondary | ICD-10-CM | POA: Insufficient documentation

## 2012-09-21 DIAGNOSIS — R1032 Left lower quadrant pain: Secondary | ICD-10-CM | POA: Insufficient documentation

## 2012-09-21 DIAGNOSIS — I10 Essential (primary) hypertension: Secondary | ICD-10-CM | POA: Insufficient documentation

## 2012-09-21 DIAGNOSIS — Z9089 Acquired absence of other organs: Secondary | ICD-10-CM | POA: Insufficient documentation

## 2012-09-21 LAB — COMPREHENSIVE METABOLIC PANEL
CO2: 28 mEq/L (ref 19–32)
Calcium: 9.6 mg/dL (ref 8.4–10.5)
Creatinine, Ser: 1.33 mg/dL (ref 0.50–1.35)
GFR calc Af Amer: 57 mL/min — ABNORMAL LOW (ref >90)
GFR calc non Af Amer: 49 mL/min — ABNORMAL LOW (ref >90)
Glucose, Bld: 125 mg/dL — ABNORMAL HIGH (ref 70–99)

## 2012-09-21 LAB — LIPASE, BLOOD: Lipase: 47 U/L (ref 11–59)

## 2012-09-21 LAB — URINALYSIS, ROUTINE W REFLEX MICROSCOPIC
Glucose, UA: NEGATIVE mg/dL
Hgb urine dipstick: NEGATIVE
Protein, ur: NEGATIVE mg/dL

## 2012-09-21 LAB — CBC WITH DIFFERENTIAL/PLATELET
Eosinophils Relative: 4 % (ref 0–5)
HCT: 37.4 % — ABNORMAL LOW (ref 39.0–52.0)
Lymphocytes Relative: 44 % (ref 12–46)
Lymphs Abs: 3.2 10*3/uL (ref 0.7–4.0)
MCV: 87.8 fL (ref 78.0–100.0)
Monocytes Absolute: 0.3 10*3/uL (ref 0.1–1.0)
Monocytes Relative: 5 % (ref 3–12)
RBC: 4.26 MIL/uL (ref 4.22–5.81)
RDW: 13.7 % (ref 11.5–15.5)
WBC: 7.2 10*3/uL (ref 4.0–10.5)

## 2012-09-21 MED ORDER — ONDANSETRON HCL 4 MG/2ML IJ SOLN
4.0000 mg | Freq: Once | INTRAMUSCULAR | Status: AC
  Administered 2012-09-21: 4 mg via INTRAVENOUS
  Filled 2012-09-21: qty 2

## 2012-09-21 MED ORDER — SODIUM CHLORIDE 0.9 % IV BOLUS (SEPSIS)
250.0000 mL | Freq: Once | INTRAVENOUS | Status: AC
  Administered 2012-09-21: 250 mL via INTRAVENOUS

## 2012-09-21 MED ORDER — IOHEXOL 300 MG/ML  SOLN
100.0000 mL | Freq: Once | INTRAMUSCULAR | Status: AC | PRN
  Administered 2012-09-21: 100 mL via INTRAVENOUS

## 2012-09-21 MED ORDER — SODIUM CHLORIDE 0.9 % IV SOLN
INTRAVENOUS | Status: DC
  Administered 2012-09-21: 19:00:00 via INTRAVENOUS

## 2012-09-21 MED ORDER — FENTANYL CITRATE 0.05 MG/ML IJ SOLN
25.0000 ug | Freq: Once | INTRAMUSCULAR | Status: AC
  Administered 2012-09-21: 25 ug via INTRAVENOUS
  Filled 2012-09-21: qty 2

## 2012-09-21 NOTE — ED Notes (Signed)
Patient has his own Fentanyl patch on.  OK per Dr. Deretha Emory.

## 2012-09-21 NOTE — ED Provider Notes (Signed)
History   This chart was scribed for Shelda Jakes, MD by Charolett Bumpers . The patient was seen in room APA04/APA04. Patient's care was started at 1725.    CSN: [id number]  Arrival date & time 09/21/12  1641   First MD Initiated Contact with Patient 09/21/12 1725      Chief Complaint  Patient presents with  . Abdominal Pain    (Consider location/radiation/quality/duration/timing/severity/associated sxs/prior treatment) HPI Comments: Ferron E Meer Reindl is a 78 y.o. male who presents to the Emergency Department complaining of Chanz, moderate left groin pain with associated LLQ abdominal pain and lower back pain for the past 2 years. He states his symptoms are worsening. Pt reports a chronic h/o SOB, diarrhea and constipation. He states that he takes stool softeners and pain medications regularly. He reports a h/o DM.   PCP: Dr. Modesto Charon at [facility]  Patient is a 78 y.o. male presenting with abdominal pain. The history is provided by the patient.  Abdominal Pain The primary symptoms of the illness include abdominal pain and shortness of breath (chronic). The primary symptoms of the illness do not include fever, nausea, vomiting or dysuria. The current episode started more than 2 days ago. The onset of the illness was gradual. The problem has been gradually worsening.  The abdominal pain radiates to the LLQ.  Additional symptoms associated with the illness include back pain. Symptoms associated with the illness do not include chills.    Past Medical History  Diagnosis Date  . CAD (coronary artery disease)     (Left main normal, LAD 30-40% mid stenosis, diagonal 30-40% stenosis, circumflex 30-40% stenosis, right coronary artery 30-40% stenosis. 2003.)  . DVT (deep vein thrombosis) in pregnancy     1998  . Sleep apnea     CPAP  . GERD (gastroesophageal reflux disease)   . Diabetes mellitus   . Hyperlipidemia   . HTN (hypertension)   . Prostate  cancer     Past Surgical History  Procedure Date  . Cholecystectomy   . Cataract extraction, bilateral   . Tonsillectomy and adenoidectomy   . Appendectomy   . Inguinal hernia repair     Bilateral    Family History  Problem Relation Age of Onset  . Cancer Father 29  . Pulmonary embolism Mother   . Diabetes Mother     History  Substance Use Topics  . Smoking status: Former Smoker    Types: Cigarettes, Pipe, Cigars    Quit date: 11/08/2003  . Smokeless tobacco: Former Neurosurgeon    Quit date: 11/08/2003  . Alcohol Use: No      Review of Systems  Constitutional: Negative for fever and chills.  Respiratory: Positive for shortness of breath (chronic).   Cardiovascular: Negative for chest pain.  Gastrointestinal: Positive for abdominal pain. Negative for nausea and vomiting.  Genitourinary: Negative for dysuria.       Groin pain.   Musculoskeletal: Positive for back pain and arthralgias.  Skin: Negative for rash.  All other systems reviewed and are negative.    Allergies  Bactrim; Flagyl; Gabapentin; Lyrica; Morphine and related; Motrin; Nsaids; Ultram; and Celebrex  Home Medications     BP 119/65  Pulse 72  Temp 98 F (36.7 C) (Oral)  Resp 18  Ht 5\' 8"  (1.727 m)  Wt 235 lb (106.595 kg)  BMI 35.73 kg/m2  SpO2 98%  Physical Exam  Nursing note and vitals reviewed. Constitutional: He is oriented to person, place, and  time. He appears well-developed and well-nourished. No distress.  HENT:  Head: Normocephalic and atraumatic.  Eyes: EOM are normal. Pupils are equal, round, and reactive to light.  Neck: Normal range of motion. Neck supple. No tracheal deviation present.  Cardiovascular: Normal rate, regular rhythm and normal heart sounds.   No murmur heard. Pulmonary/Chest: Effort normal and breath sounds normal. No respiratory distress. He has no wheezes.  Abdominal: Soft. Bowel sounds are normal. He exhibits no distension. There is no tenderness.       No  palpable bulges.   Musculoskeletal: Normal range of motion. He exhibits no edema.       Moves all extremities.   Neurological: He is alert and oriented to person, place, and time. No cranial nerve deficit.       Cranial nerves intact.   Skin: Skin is warm and dry.  Psychiatric: He has a normal mood and affect. His behavior is normal.    ED Course  Procedures (including critical care time)  DIAGNOSTIC STUDIES: Oxygen Saturation is 98% on room air, normal by my interpretation.    COORDINATION OF CARE:  17:45-Discussed planned course of treatment with the patient including IV fluids, pain and nausea medication, CT of abdomen, UA and blood work, who is agreeable at this time.   18:15-Medication Orders: Fentanyl (Sublimaze) injection 25 mcg-once; Ondansetron (Zofran) injection 4 mg-once; Sodium chloride 0.9% bolus 250 mL-once  Results for orders placed during the hospital encounter of 09/21/12  URINALYSIS, ROUTINE W REFLEX MICROSCOPIC      Component Value Range   Color, Urine YELLOW  YELLOW   APPearance CLEAR  CLEAR   Specific Gravity, Urine 1.025  1.005 - 1.030   pH 5.5  5.0 - 8.0   Glucose, UA NEGATIVE  NEGATIVE mg/dL   Hgb urine dipstick NEGATIVE  NEGATIVE   Bilirubin Urine NEGATIVE  NEGATIVE   Ketones, ur NEGATIVE  NEGATIVE mg/dL   Protein, ur NEGATIVE  NEGATIVE mg/dL   Urobilinogen, UA 0.2  0.0 - 1.0 mg/dL   Nitrite NEGATIVE  NEGATIVE   Leukocytes, UA NEGATIVE  NEGATIVE  CBC WITH DIFFERENTIAL      Component Value Range   WBC 7.2  4.0 - 10.5 K/uL   RBC 4.26  4.22 - 5.81 MIL/uL   Hemoglobin 13.2  13.0 - 17.0 g/dL   HCT 40.9 (*) 81.1 - 91.4 %   MCV 87.8  78.0 - 100.0 fL   MCH 31.0  26.0 - 34.0 pg   MCHC 35.3  30.0 - 36.0 g/dL   RDW 78.2  95.6 - 21.3 %   Platelets 132 (*) 150 - 400 K/uL   Neutrophils Relative 47  43 - 77 %   Neutro Abs 3.4  1.7 - 7.7 K/uL   Lymphocytes Relative 44  12 - 46 %   Lymphs Abs 3.2  0.7 - 4.0 K/uL   Monocytes Relative 5  3 - 12 %   Monocytes  Absolute 0.3  0.1 - 1.0 K/uL   Eosinophils Relative 4  0 - 5 %   Eosinophils Absolute 0.3  0.0 - 0.7 K/uL   Basophils Relative 1  0 - 1 %   Basophils Absolute 0.1  0.0 - 0.1 K/uL  COMPREHENSIVE METABOLIC PANEL      Component Value Range   Sodium 138  135 - 145 mEq/L   Potassium 4.0  3.5 - 5.1 mEq/L   Chloride 101  96 - 112 mEq/L   CO2 28  19 - 32  mEq/L   Glucose, Bld 125 (*) 70 - 99 mg/dL   BUN 19  6 - 23 mg/dL   Creatinine, Ser 1.61  0.50 - 1.35 mg/dL   Calcium 9.6  8.4 - 09.6 mg/dL   Total Protein 6.8  6.0 - 8.3 g/dL   Albumin 3.5  3.5 - 5.2 g/dL   AST 18  0 - 37 U/L   ALT 16  0 - 53 U/L   Alkaline Phosphatase 48  39 - 117 U/L   Total Bilirubin 0.3  0.3 - 1.2 mg/dL   GFR calc non Af Amer 49 (*) >90 mL/min   GFR calc Af Amer 57 (*) >90 mL/min  LIPASE, BLOOD      Component Value Range   Lipase 47  11 - 59 U/L    Ct Abdomen Pelvis W Contrast  09/21/2012  *RADIOLOGY REPORT*  Clinical Data: Abdominal pain, left lower quadrant pain, hip pain.  CT ABDOMEN AND PELVIS WITH CONTRAST  Technique:  Multidetector CT imaging of the abdomen and pelvis was performed following the standard protocol during bolus administration of intravenous contrast.  Contrast: OMNIPAQUE IOHEXOL 300 MG/ML  SOLN  Comparison: None.  Findings: The lung bases are clear.  No pericardial fluid.  No focal hepatic lesion.  Post cholecystectomy.  The pancreas, spleen, and adrenal glands are normal.  There are bilateral simple renal cysts.  The stomach, small bowel, and cecum are normal.  There are several diverticula of the descending colon sigmoid colon without acute inflammation.  Abdominal aorta is normal caliber.  No retroperitoneal or periportal lymphadenopathy.  No free fluid the pelvis.  Prostate gland and  bladder normal.  No pelvic lymphadenopathy.  Review of the bone windows demonstrates severe degenerative endplate change and osteophytosis of the lumbar spine.  There is central canal stenosis at the L3-L4  secondary to a osteophytosis and facet hypertrophy.  IMPRESSION:  1.  No acute abdominal or pelvic findings. 2.  Sigmoid diverticulosis without  diverticulitis. 3.  Significant disc osteophytic disease of the lumbar spine with central canal narrowing at L3-L4.   Original Report Authenticated By: Genevive Bi, M.D.      1. Abdominal pain   2. Groin pain, left lower quadrant       MDM  CT scan without any evidence of intra-abdominal process or hernia or groin hernia. Suspect symptoms are musculoskeletal in nature definitely have significant degenerative changes in the lumbar area and by your own history it had a lot of trouble with hip pain particularly on the left.   I personally performed the services described in this documentation, which was scribed in my presence. The recorded information has been reviewed and considered.        Shelda Jakes, MD 09/21/12 [phone]

## 2012-09-21 NOTE — ED Notes (Signed)
Patient transported to CT scan via wheelchair.

## 2012-09-21 NOTE — ED Notes (Signed)
Pain LLQ, bil hip pain, and low back pain, x 2 years and getting worse.  Feels weak.

## 2012-09-21 NOTE — ED Notes (Signed)
Discharge instructions given and reviewed with patient.  Patient verbalized understanding to follow up with his PMD.  Patient discharged home in good condition via wheelchair.  Daughter accompanied discharge to drive home.

## 2012-09-29 ENCOUNTER — Other Ambulatory Visit: Payer: Self-pay | Admitting: Family Medicine

## 2012-09-29 DIAGNOSIS — M4800 Spinal stenosis, site unspecified: Secondary | ICD-10-CM

## 2012-09-29 DIAGNOSIS — R29898 Other symptoms and signs involving the musculoskeletal system: Secondary | ICD-10-CM

## 2012-10-02 ENCOUNTER — Ambulatory Visit (HOSPITAL_COMMUNITY)
Admission: RE | Admit: 2012-10-02 | Discharge: 2012-10-02 | Disposition: A | Discharge status: Home / Self Care | Payer: Medicare Other | Source: Ambulatory Visit | Attending: Family Medicine | Admitting: Family Medicine

## 2012-10-02 DIAGNOSIS — R29898 Other symptoms and signs involving the musculoskeletal system: Secondary | ICD-10-CM

## 2012-10-02 DIAGNOSIS — M48061 Spinal stenosis, lumbar region without neurogenic claudication: Secondary | ICD-10-CM | POA: Insufficient documentation

## 2012-10-02 DIAGNOSIS — M4800 Spinal stenosis, site unspecified: Secondary | ICD-10-CM

## 2012-10-02 DIAGNOSIS — M5137 Other intervertebral disc degeneration, lumbosacral region: Secondary | ICD-10-CM | POA: Insufficient documentation

## 2012-10-02 DIAGNOSIS — M51379 Other intervertebral disc degeneration, lumbosacral region without mention of lumbar back pain or lower extremity pain: Secondary | ICD-10-CM | POA: Insufficient documentation

## 2012-10-02 DIAGNOSIS — M79609 Pain in unspecified limb: Secondary | ICD-10-CM | POA: Insufficient documentation

## 2012-10-02 DIAGNOSIS — M545 Low back pain, unspecified: Secondary | ICD-10-CM | POA: Insufficient documentation

## 2012-10-07 ENCOUNTER — Ambulatory Visit: Payer: Medicare Other | Admitting: Physical Therapy

## 2013-04-12 ENCOUNTER — Ambulatory Visit (INDEPENDENT_AMBULATORY_CARE_PROVIDER_SITE_OTHER): Payer: Medicare Other | Admitting: Family Medicine

## 2013-04-12 ENCOUNTER — Encounter: Payer: Self-pay | Admitting: Family Medicine

## 2013-04-12 VITALS — BP 113/67 | HR 63 | Temp 98.4°F | Ht 69.0 in | Wt 234.4 lb

## 2013-04-12 DIAGNOSIS — E785 Hyperlipidemia, unspecified: Secondary | ICD-10-CM

## 2013-04-12 DIAGNOSIS — E119 Type 2 diabetes mellitus without complications: Secondary | ICD-10-CM

## 2013-04-12 DIAGNOSIS — C61 Malignant neoplasm of prostate: Secondary | ICD-10-CM

## 2013-04-12 DIAGNOSIS — N289 Disorder of kidney and ureter, unspecified: Secondary | ICD-10-CM

## 2013-04-12 DIAGNOSIS — E041 Nontoxic single thyroid nodule: Secondary | ICD-10-CM

## 2013-04-12 DIAGNOSIS — M4306 Spondylolysis, lumbar region: Secondary | ICD-10-CM

## 2013-04-12 DIAGNOSIS — E1139 Type 2 diabetes mellitus with other diabetic ophthalmic complication: Secondary | ICD-10-CM

## 2013-04-12 DIAGNOSIS — M4310 Spondylolisthesis, site unspecified: Secondary | ICD-10-CM

## 2013-04-12 DIAGNOSIS — E11319 Type 2 diabetes mellitus with unspecified diabetic retinopathy without macular edema: Secondary | ICD-10-CM

## 2013-04-12 DIAGNOSIS — I2510 Atherosclerotic heart disease of native coronary artery without angina pectoris: Secondary | ICD-10-CM

## 2013-04-12 DIAGNOSIS — I10 Essential (primary) hypertension: Secondary | ICD-10-CM

## 2013-04-12 LAB — HEPATIC FUNCTION PANEL
ALT: 11 U/L (ref 0–53)
AST: 15 U/L (ref 0–37)
Albumin: 4.2 g/dL (ref 3.5–5.2)
Alkaline Phosphatase: 43 U/L (ref 39–117)
Bilirubin, Direct: 0.1 mg/dL (ref 0.0–0.3)
Indirect Bilirubin: 0.4 mg/dL (ref 0.0–0.9)
Total Bilirubin: 0.5 mg/dL (ref 0.3–1.2)
Total Protein: 7 g/dL (ref 6.0–8.3)

## 2013-04-12 LAB — BASIC METABOLIC PANEL WITH GFR
BUN: 23 mg/dL (ref 6–23)
CO2: 29 mEq/L (ref 19–32)
Calcium: 9.8 mg/dL (ref 8.4–10.5)
Chloride: 104 mEq/L (ref 96–112)
Creat: 1.6 mg/dL — ABNORMAL HIGH (ref 0.50–1.35)
GFR, Est African American: 46 mL/min — ABNORMAL LOW
GFR, Est Non African American: 40 mL/min — ABNORMAL LOW
Glucose, Bld: 97 mg/dL (ref 70–99)
Potassium: 4.6 mEq/L (ref 3.5–5.3)
Sodium: 142 mEq/L (ref 135–145)

## 2013-04-12 LAB — POCT UA - MICROALBUMIN: Microalbumin Ur, POC: NEGATIVE mg/dL

## 2013-04-12 LAB — POCT GLYCOSYLATED HEMOGLOBIN (HGB A1C): Hemoglobin A1C: 6.1

## 2013-04-12 MED ORDER — FENTANYL 25 MCG/HR TD PT72: 1.0000 | MEDICATED_PATCH | TRANSDERMAL | Status: DC

## 2013-04-12 NOTE — Progress Notes (Signed)
Patient ID: [name], male   DOB: May 11, 1930, 79 y.o.   MRN: [id number] SUBJECTIVE: HPI: Patient is here for follow up of Diabetes Mellitus.Symptoms of DM:has had Nocturia ,deniesUrinary Frequency ,admits to Blurred vision being evaluated by retinal specialist for the right eye decreased vision.  ,admits toDizziness,denies.Dysuria,admits toparesthesias hands, deniesextremity pain or ulcers. Marland Kitchendenieschest pain. .has hadan annual eye exam. do check the feet. doescheck CBGs. Average CBG:__110 to 120______.Marland Kitchen deniesto episodes of hypoglycemia. doeshave an emergency hypoglycemic plan. admits toCompliance with medications. deniesProblems with medications.   Patient is here for follow up of hypertension: denies Headache;deniesChest Pain;denies weakness;denies Shortness of Breath or Orthopnea;admits to Visual changes;denies palpitations;denies cough;denies pedal edema;denies symptoms of TIA or stroke; admits to Compliance with medications. denies Problems with medications.   PMH/PSH: reviewed/updated in Epic  SH/FH: reviewed/updated in Epic  Allergies: reviewed/updated in Epic  Medications: reviewed/updated in Epic  Immunizations: reviewed/updated in Epic  ROS: As above in the HPI. All other systems are stable or negative.  OBJECTIVE:  APPEARANCE:  Patient in no acute distress.The patient appeared well nourished and normally developed. Acyanotic. Waist:obese VITAL SIGNS:BP 113/67  Pulse 63  Temp(Src) 98.4 F (36.9 C) (Oral)  Ht 5\' 9"  (1.753 m)  Wt 234 lb 6.4 oz (106.323 kg)  BMI 34.6 kg/m2   SKIN: warm and  Dry without overt rashes, tattoos and scars  HEAD and Neck: without JVD, Head and scalp: normal Eyes:No scleral icterus. , eye movementsabnormal with the right eye.appears to be a lateral rectus weakness on the right eye. Ears: Auricle normal, canal normal, Tympanic membranes normal, insufflation normal. Nose: normal Throat: normal Neck & thyroid:  normal  CHEST & LUNGS: Chest wall: normal Lungs: Clear  CVS: Reveals the PMI to be normally located. Regular rhythm, First and Second Heart sounds are normal,  absence of murmurs, rubs or gallops. Peripheral vasculature: Radial pulses: normal Dorsal pedis pulses: normal Posterior pulses: normal  ABDOMEN:  Appearance: normal Benign,, no organomegaly, no masses, no Abdominal Aortic enlargement. No Guarding , no rebound. No Bruits. Bowel sounds: normal  RECTAL: N/A GU: N/A  EXTREMETIES: nonedematous. Pulses deminished  MUSCULOSKELETAL:  Spine pain on ROM NEUROLOGIC: oriented to time,place and person; nonfocal. ambulsates with a cane Peripheral neuropathy Cranial Nerves are abnormal as above eye movements findings  ASSESSMENT: CAD (coronary artery disease)  Diabetes mellitus, type 2 - Plan: POCT glycosylated hemoglobin (Hb A1C), POCT UA - Microalbumin  Diabetic retinopathy  HTN (hypertension) - Plan: BASIC METABOLIC PANEL WITH GFR  Hyperlipidemia - Plan: Hepatic function panel, NMR Lipoprofile with Lipids  Kidney disease  Lumbar spondylolysis - Plan: fentaNYL (DURAGESIC - DOSED MCG/HR) 25 MCG/HR  Prostate cancer  Thyroid nodule  New eye findings patient has an appointment with a retinal specialist from his recent eye exam with his eye doctor. Discussed need to lose weight and exercise even in a  Chair and change his high fat unhealthy diet.  PLAN: Orders Placed This Encounter  Procedures  . BASIC METABOLIC PANEL WITH GFR  . Hepatic function panel  . NMR Lipoprofile with Lipids  . POCT glycosylated hemoglobin (Hb A1C)  . POCT UA - Microalbumin   Results for orders placed in visit on 04/12/13 (from the past 24 hour(s))  POCT GLYCOSYLATED HEMOGLOBIN (HGB A1C)     Status: Normal   Collection Time    04/12/13  1:52 PM      Result Value Range   Hemoglobin A1C 6.1    POCT UA - MICROALBUMIN  Status: None   Collection Time    04/12/13  2:03 PM       Result Value Range   Microalbumin Ur, POC negative     Meds ordered this encounter  Medications  . fentaNYL (DURAGESIC - DOSED MCG/HR) 25 MCG/HR    Sig: Place 1 patch (25 mcg total) onto the skin every 3 (three) days.    Dispense:  10 patch    Refill:  0    end organ damage  Discussed with patient. At this point his longterm prognosis is poor. Lives alone with limited resources and eats cheap high fat food. Does get meals on wheels. No interest in other options.  Await labs and RTC in 3 months.  Hodan Wurtz P. Modesto Charon, M.D.

## 2013-04-14 ENCOUNTER — Other Ambulatory Visit: Payer: Self-pay | Admitting: *Deleted

## 2013-04-14 LAB — NMR LIPOPROFILE WITH LIPIDS
Cholesterol, Total: 106 mg/dL (ref <200)
HDL Particle Number: 26.5 umol/L — ABNORMAL LOW (ref >=30.5)
HDL Size: 9 nm — ABNORMAL LOW (ref >=9.2)
HDL-C: 38 mg/dL — ABNORMAL LOW (ref >=40)
LDL (calc): 39 mg/dL (ref <100)
LDL Particle Number: 772 nmol/L (ref <1000)
LDL Size: 20 nm — ABNORMAL LOW (ref >20.5)
LP-IR Score: 44 (ref <=45)
Large HDL-P: 4 umol/L — ABNORMAL LOW (ref >=4.8)
Large VLDL-P: 1.7 nmol/L (ref <=2.7)
Small LDL Particle Number: 521 nmol/L (ref <=527)
Triglycerides: 147 mg/dL (ref <150)
VLDL Size: 44 nm (ref <=46.6)

## 2013-04-14 NOTE — Telephone Encounter (Signed)
LAST OFFICE VISIT 12/13 

## 2013-04-15 MED ORDER — RAMIPRIL 10 MG PO TABS: 10.0000 mg | ORAL_TABLET | Freq: Every morning | ORAL | Status: DC

## 2013-04-16 NOTE — Progress Notes (Signed)
Quick Note:  Lab result at goal for Diabetes. The LDLc is a little too low. Should reduce to 1/2 a tablet of the crestor daily. Other medications,No change for now. No Change in follow up. ______

## 2013-04-27 ENCOUNTER — Other Ambulatory Visit: Payer: Self-pay

## 2013-04-27 MED ORDER — LINAGLIPTIN 5 MG PO TABS: 5.0000 mg | ORAL_TABLET | Freq: Every morning | ORAL | Status: DC

## 2013-04-27 NOTE — Telephone Encounter (Signed)
Last seen 09/25/12   Last glucose level 12/11/12

## 2013-04-27 NOTE — Telephone Encounter (Signed)
Approve renewal. FW

## 2013-05-10 ENCOUNTER — Other Ambulatory Visit: Payer: Self-pay | Admitting: Family Medicine

## 2013-05-17 ENCOUNTER — Other Ambulatory Visit: Payer: Self-pay | Admitting: *Deleted

## 2013-05-17 MED ORDER — FENTANYL 25 MCG/HR TD PT72: 1.0000 | MEDICATED_PATCH | TRANSDERMAL | Status: DC

## 2013-05-17 NOTE — Telephone Encounter (Signed)
Script given to patient

## 2013-05-19 ENCOUNTER — Other Ambulatory Visit: Payer: Self-pay | Admitting: Family Medicine

## 2013-05-28 ENCOUNTER — Other Ambulatory Visit: Payer: Self-pay | Admitting: Family Medicine

## 2013-06-08 ENCOUNTER — Other Ambulatory Visit: Payer: Self-pay | Admitting: Family Medicine

## 2013-06-08 ENCOUNTER — Other Ambulatory Visit: Payer: Self-pay | Admitting: *Deleted

## 2013-06-08 MED ORDER — AMLODIPINE BESYLATE 5 MG PO TABS: 5.0000 mg | ORAL_TABLET | Freq: Every day | ORAL | Status: DC

## 2013-06-21 ENCOUNTER — Other Ambulatory Visit: Payer: Self-pay | Admitting: Family Medicine

## 2013-06-21 DIAGNOSIS — M4306 Spondylolysis, lumbar region: Secondary | ICD-10-CM

## 2013-06-23 MED ORDER — FENTANYL 25 MCG/HR TD PT72: 1.0000 | MEDICATED_PATCH | TRANSDERMAL | Status: DC

## 2013-06-23 NOTE — Telephone Encounter (Signed)
Last seen and filled by Modesto Charon on 04/12/13, if approved, Rx will print,  Have Joyce Gross call pt to pickup [phone]

## 2013-07-10 ENCOUNTER — Other Ambulatory Visit: Payer: Self-pay | Admitting: Family Medicine

## 2013-07-13 ENCOUNTER — Encounter: Payer: Self-pay | Admitting: Family Medicine

## 2013-07-13 ENCOUNTER — Other Ambulatory Visit: Payer: Self-pay | Admitting: *Deleted

## 2013-07-13 ENCOUNTER — Ambulatory Visit (INDEPENDENT_AMBULATORY_CARE_PROVIDER_SITE_OTHER): Payer: Medicare Other | Admitting: Family Medicine

## 2013-07-13 VITALS — BP 96/59 | Temp 98.7°F | Resp 64 | Wt 234.0 lb

## 2013-07-13 DIAGNOSIS — M4306 Spondylolysis, lumbar region: Secondary | ICD-10-CM

## 2013-07-13 DIAGNOSIS — H3530 Unspecified macular degeneration: Secondary | ICD-10-CM

## 2013-07-13 DIAGNOSIS — E785 Hyperlipidemia, unspecified: Secondary | ICD-10-CM

## 2013-07-13 DIAGNOSIS — M4310 Spondylolisthesis, site unspecified: Secondary | ICD-10-CM

## 2013-07-13 DIAGNOSIS — E1140 Type 2 diabetes mellitus with diabetic neuropathy, unspecified: Secondary | ICD-10-CM

## 2013-07-13 DIAGNOSIS — I2510 Atherosclerotic heart disease of native coronary artery without angina pectoris: Secondary | ICD-10-CM

## 2013-07-13 DIAGNOSIS — E1339 Other specified diabetes mellitus with other diabetic ophthalmic complication: Secondary | ICD-10-CM

## 2013-07-13 DIAGNOSIS — E11319 Type 2 diabetes mellitus with unspecified diabetic retinopathy without macular edema: Secondary | ICD-10-CM

## 2013-07-13 DIAGNOSIS — E119 Type 2 diabetes mellitus without complications: Secondary | ICD-10-CM

## 2013-07-13 DIAGNOSIS — E083299 Diabetes mellitus due to underlying condition with mild nonproliferative diabetic retinopathy without macular edema, unspecified eye: Secondary | ICD-10-CM

## 2013-07-13 DIAGNOSIS — N289 Disorder of kidney and ureter, unspecified: Secondary | ICD-10-CM

## 2013-07-13 DIAGNOSIS — E1142 Type 2 diabetes mellitus with diabetic polyneuropathy: Secondary | ICD-10-CM

## 2013-07-13 DIAGNOSIS — C61 Malignant neoplasm of prostate: Secondary | ICD-10-CM

## 2013-07-13 LAB — POCT GLYCOSYLATED HEMOGLOBIN (HGB A1C): Hemoglobin A1C: 6.1

## 2013-07-13 MED ORDER — NEBIVOLOL HCL 10 MG PO TABS: 10.0000 mg | ORAL_TABLET | Freq: Every morning | ORAL | Status: DC

## 2013-07-13 NOTE — Progress Notes (Signed)
Patient ID: [name], male   DOB: Sep 07, 1930, 79 y.o.   MRN: [id number] SUBJECTIVE: CC: Chief Complaint  Patient presents with  . Follow-up    3 month follow up having prostate ultrasound and bx on 8-11discuss meds unsure about dose on metformin     HPI: Seen by urology. Patient afraid to move ahead with the prostate biopsy. His psa had  Doubled. He has had a dx of prostate cancer.  Patient is here for follow up of Diabetes Mellitus and other medical problems: Symptoms of DM: Denies Nocturia ,Denies Urinary Frequency , denies Blurred vision ,deniesDizziness,denies.Dysuria,denies paresthesias, denies extremity pain or ulcers.Marland Kitchendenies chest pain. has had an annual eye exam. do check the feet. Does check CBGs. Average CBG: no changes Denies episodes of hypoglycemia. Does have an emergency hypoglycemic plan. admits toCompliance with medications. Denies Problems with medications.  Past Medical History  Diagnosis Date  . DVT of leg (deep venous thrombosis) 1999    RLE   . Hypertension   . Hiatal hernia   . Lumbar spondylosis   . Thyroid nodule   . Goiter     stable x many year - last endo exam 08/2010  . Sleep apnea   . Type 2 diabetes mellitus   . Chronic kidney disease   . Diabetic retinopathy     right eye   . Diabetic neuropathy   . Osteopenia   . CAD (coronary artery disease)     (Left main normal, LAD 30-40% mid stenosis, diagonal 30-40% stenosis, circumflex 30-40% stenosis, right coronary artery 30-40% stenosis. 2003.)  . DVT (deep vein thrombosis) in pregnancy     1998  . Sleep apnea     CPAP  . GERD (gastroesophageal reflux disease)   . Diabetes mellitus   . Hyperlipidemia   . HTN (hypertension)   . Prostate cancer    Past Surgical History  Procedure Laterality Date  . Tonsilectomy, adenoidectomy, bilateral myringotomy and tubes    . Inguinal hernia repair      bilateral  . Knee arthroscopy      left   . Biopsy of right ear    . Cholecystectomy     . Cataract extraction, bilateral    . Tonsillectomy and adenoidectomy    . Appendectomy    . Inguinal hernia repair      Bilateral   Allergies  Allergen Reactions  . Bactrim   . Celebrex (Celecoxib)   . Flagyl (Metronidazole Hcl)   . Gabapentin   . Lyrica (Pregabalin)   . Lyrica (Pregabalin)     Increase appetite   . Morphine And Related   . Motrin (Ibuprofen)   . Nsaids   . Nsaids     Swelling    . Ultram (Tramadol Hcl)   . Ultram (Tramadol Hcl)     Itching / rash     . Vioxx (Rofecoxib)     Swelling    . Celebrex (Celecoxib) Rash   Immunization History  Administered Date(s) Administered  . Influenza Whole 10/30/2009  . Pneumococcal Polysaccharide 02/28/1999    ROS: As above in the HPI. All other systems are stable or negative.  OBJECTIVE: APPEARANCE:  Patient in no acute distress.The patient appeared well nourished and normally developed. Acyanotic. Waist: VITAL SIGNS:BP 96/59  Temp(Src) 98.7 F (37.1 C) (Oral)  Resp 64  Wt 234 lb (106.142 kg)  BMI 34.54 kg/m2 Obese WM  SKIN: warm and  Dry without overt rashes, tattoos and scars  HEAD and Neck:  without JVD, Head and scalp: normal Eyes:No scleral icterus. Fundi normal, eye movements normal. Ears: Auricle normal, canal normal, Tympanic membranes normal, insufflation normal. Nose: normal Throat: normal Neck & thyroid: normal  CHEST & LUNGS: Chest wall: normal Lungs: Clear  CVS: Reveals the PMI to be normally located. Regular rhythm, First and Second Heart sounds are normal,  absence of murmurs, rubs or gallops. Peripheral vasculature: Radial pulses: normal  ABDOMEN:  Appearance: obese Benign, no organomegaly, no masses, no Abdominal Aortic enlargement. No Guarding , no rebound. No Bruits. Bowel sounds: normal  RECTAL: N/A GU: N/A  EXTREMETIES: nonedematous. . MUSCULOSKELETAL:  Spine: abnormal with decreased ROM  NEUROLOGIC: oriented to time,place and person; nonfocal. Cranial  Nerves are normal.  Results for orders placed in visit on 07/13/13  POCT GLYCOSYLATED HEMOGLOBIN (HGB A1C)      Result Value Range   Hemoglobin A1C 6.1 %     ASSESSMENT: Prostate cancer  Lumbar spondylolysis  Kidney disease  Hyperlipidemia - Plan: NMR Lipoprofile with Lipids  Diabetic retinopathy, due to underlying condition, without macular edema, with mild nonproliferative retinopathy - Plan: POCT glycosylated hemoglobin (Hb A1C)  Diabetic neuropathy - Plan: POCT glycosylated hemoglobin (Hb A1C)  Diabetes mellitus, type 2 - Plan: POCT glycosylated hemoglobin (Hb A1C), COMPLETE METABOLIC PANEL WITH GFR  CAD (coronary artery disease)  Macular degeneration of both eyes  PLAN:  Orders Placed This Encounter  Procedures  . COMPLETE METABOLIC PANEL WITH GFR  . NMR Lipoprofile with Lipids  . POCT glycosylated hemoglobin (Hb A1C)   Meds ordered this encounter  Medications  . levofloxacin (LEVAQUIN) 500 MG tablet    Sig:   . tobramycin (TOBREX) 0.3 % ophthalmic solution    Sig:   . rosuvastatin (CRESTOR) 5 MG tablet    Sig: Take by mouth daily. Take 1/2 tablet        Dr Woodroe Mode Recommendations  Diet and Exercise discussed with patient.  For nutrition information, I recommend books:  1).Eat to Live by Dr Monico Hoar. 2).Prevent and Reverse Heart Disease by Dr Suzzette Righter. 3) Dr Katherina Right Book:  Program to Reverse Diabetes  Exercise recommendations are:  If unable to walk, then the patient can exercise in a chair 3 times a day. By flapping arms like a bird gently and raising legs outwards to the front.  If ambulatory, the patient can go for walks for 30 minutes 3 times a week. Then increase the intensity and duration as tolerated.  Goal is to try to attain exercise frequency to 5 times a week.  If applicable: Best to perform resistance exercises (machines or weights) 2 days a week and cardio type exercises 3 days per week.  Return in about 3  months (around 10/13/2013) for Recheck medical problems.  Keeyon Privitera P. Modesto Charon, M.D.

## 2013-07-14 LAB — NMR LIPOPROFILE WITH LIPIDS
Cholesterol, Total: 121 mg/dL (ref <200)
HDL Particle Number: 27.3 umol/L — ABNORMAL LOW (ref >=30.5)
HDL Size: 9.2 nm (ref >=9.2)
HDL-C: 41 mg/dL (ref >=40)
LDL (calc): 40 mg/dL (ref <100)
LDL Particle Number: 738 nmol/L (ref <1000)
LDL Size: 20.4 nm — ABNORMAL LOW (ref >20.5)
LP-IR Score: 40 (ref <=45)
Large HDL-P: 7.4 umol/L (ref >=4.8)
Large VLDL-P: 2.9 nmol/L — ABNORMAL HIGH (ref <=2.7)
Small LDL Particle Number: 340 nmol/L (ref <=527)
Triglycerides: 200 mg/dL — ABNORMAL HIGH (ref <150)
VLDL Size: 43.1 nm (ref <=46.6)

## 2013-07-14 LAB — COMPLETE METABOLIC PANEL WITH GFR
ALT: 17 U/L (ref 0–53)
AST: 20 U/L (ref 0–37)
Albumin: 4.3 g/dL (ref 3.5–5.2)
Alkaline Phosphatase: 42 U/L (ref 39–117)
BUN: 17 mg/dL (ref 6–23)
CO2: 29 mEq/L (ref 19–32)
Calcium: 9.3 mg/dL (ref 8.4–10.5)
Chloride: 105 mEq/L (ref 96–112)
Creat: 1.66 mg/dL — ABNORMAL HIGH (ref 0.50–1.35)
GFR, Est African American: 44 mL/min — ABNORMAL LOW
GFR, Est Non African American: 38 mL/min — ABNORMAL LOW
Glucose, Bld: 89 mg/dL (ref 70–99)
Potassium: 4.8 mEq/L (ref 3.5–5.3)
Sodium: 142 mEq/L (ref 135–145)
Total Bilirubin: 0.4 mg/dL (ref 0.3–1.2)
Total Protein: 6.8 g/dL (ref 6.0–8.3)

## 2013-07-16 NOTE — Progress Notes (Signed)
Quick Note:  Lab result at goal.lipids may be too tightly controlled No change in Medications for now. No Change in plans and follow up. Will adjust if too low next time. ______

## 2013-07-29 ENCOUNTER — Other Ambulatory Visit: Payer: Self-pay | Admitting: Family Medicine

## 2013-07-29 DIAGNOSIS — M4306 Spondylolysis, lumbar region: Secondary | ICD-10-CM

## 2013-07-29 MED ORDER — FENTANYL 25 MCG/HR TD PT72: 1.0000 | MEDICATED_PATCH | TRANSDERMAL | Status: DC

## 2013-07-29 NOTE — Telephone Encounter (Signed)
Patient last seen in office on 07-13-13. Rx last filled on 06-25-13 for #30. Please advise. If approved please print and have pt pick up rx. [facility])

## 2013-07-30 NOTE — Telephone Encounter (Signed)
Pt aware t pick up rx

## 2013-08-17 ENCOUNTER — Other Ambulatory Visit: Payer: Self-pay | Admitting: Family Medicine

## 2013-08-23 ENCOUNTER — Other Ambulatory Visit: Payer: Self-pay | Admitting: *Deleted

## 2013-08-23 DIAGNOSIS — M4306 Spondylolysis, lumbar region: Secondary | ICD-10-CM

## 2013-08-23 NOTE — Telephone Encounter (Signed)
Patient last seen in office on 7-15 by Modesto Charon. Rx last filled on 08-02-13 for #10. Please advise. If approved please print and route to Pool A so nurse can contact patient to come and pick up.  Modesto Charon)

## 2013-08-24 MED ORDER — FENTANYL 25 MCG/HR TD PT72: 1.0000 | MEDICATED_PATCH | TRANSDERMAL | Status: DC

## 2013-08-24 NOTE — Telephone Encounter (Signed)
PT AWARE RX IS READY FOR PICK UP AND AT FRONT DESK

## 2013-08-24 NOTE — Telephone Encounter (Signed)
Rx ready for pick up. 

## 2013-09-01 ENCOUNTER — Other Ambulatory Visit: Payer: Self-pay

## 2013-09-01 ENCOUNTER — Encounter (HOSPITAL_COMMUNITY): Payer: Self-pay

## 2013-09-01 ENCOUNTER — Emergency Department (HOSPITAL_COMMUNITY)
Admission: EM | Admit: 2013-09-01 | Discharge: 2013-09-01 | Disposition: A | Discharge status: Home / Self Care | Payer: Medicare Other | Attending: Emergency Medicine | Admitting: Emergency Medicine

## 2013-09-01 ENCOUNTER — Emergency Department (HOSPITAL_COMMUNITY): Payer: Medicare Other

## 2013-09-01 DIAGNOSIS — K219 Gastro-esophageal reflux disease without esophagitis: Secondary | ICD-10-CM | POA: Insufficient documentation

## 2013-09-01 DIAGNOSIS — I129 Hypertensive chronic kidney disease with stage 1 through stage 4 chronic kidney disease, or unspecified chronic kidney disease: Secondary | ICD-10-CM | POA: Insufficient documentation

## 2013-09-01 DIAGNOSIS — M545 Low back pain, unspecified: Secondary | ICD-10-CM | POA: Insufficient documentation

## 2013-09-01 DIAGNOSIS — Z8546 Personal history of malignant neoplasm of prostate: Secondary | ICD-10-CM | POA: Insufficient documentation

## 2013-09-01 DIAGNOSIS — E785 Hyperlipidemia, unspecified: Secondary | ICD-10-CM | POA: Insufficient documentation

## 2013-09-01 DIAGNOSIS — G8929 Other chronic pain: Secondary | ICD-10-CM | POA: Insufficient documentation

## 2013-09-01 DIAGNOSIS — G4730 Sleep apnea, unspecified: Secondary | ICD-10-CM | POA: Insufficient documentation

## 2013-09-01 DIAGNOSIS — Z87891 Personal history of nicotine dependence: Secondary | ICD-10-CM | POA: Insufficient documentation

## 2013-09-01 DIAGNOSIS — R5381 Other malaise: Secondary | ICD-10-CM | POA: Insufficient documentation

## 2013-09-01 DIAGNOSIS — I2510 Atherosclerotic heart disease of native coronary artery without angina pectoris: Secondary | ICD-10-CM | POA: Insufficient documentation

## 2013-09-01 DIAGNOSIS — E1149 Type 2 diabetes mellitus with other diabetic neurological complication: Secondary | ICD-10-CM | POA: Insufficient documentation

## 2013-09-01 DIAGNOSIS — Z86718 Personal history of other venous thrombosis and embolism: Secondary | ICD-10-CM | POA: Insufficient documentation

## 2013-09-01 DIAGNOSIS — N189 Chronic kidney disease, unspecified: Secondary | ICD-10-CM | POA: Insufficient documentation

## 2013-09-01 DIAGNOSIS — Z79899 Other long term (current) drug therapy: Secondary | ICD-10-CM | POA: Insufficient documentation

## 2013-09-01 DIAGNOSIS — E1142 Type 2 diabetes mellitus with diabetic polyneuropathy: Secondary | ICD-10-CM | POA: Insufficient documentation

## 2013-09-01 DIAGNOSIS — M549 Dorsalgia, unspecified: Secondary | ICD-10-CM

## 2013-09-01 DIAGNOSIS — Z7982 Long term (current) use of aspirin: Secondary | ICD-10-CM | POA: Insufficient documentation

## 2013-09-01 HISTORY — DX: Other chronic pain: G89.29

## 2013-09-01 HISTORY — DX: Dorsalgia, unspecified: M54.9

## 2013-09-01 LAB — COMPREHENSIVE METABOLIC PANEL
ALT: 21 U/L (ref 0–53)
AST: 26 U/L (ref 0–37)
Albumin: 3.6 g/dL (ref 3.5–5.2)
Alkaline Phosphatase: 44 U/L (ref 39–117)
Calcium: 9.6 mg/dL (ref 8.4–10.5)
GFR calc Af Amer: 42 mL/min — ABNORMAL LOW (ref >90)
Potassium: 4.7 mEq/L (ref 3.5–5.1)
Sodium: 140 mEq/L (ref 135–145)
Total Protein: 7.1 g/dL (ref 6.0–8.3)

## 2013-09-01 LAB — CBC WITH DIFFERENTIAL/PLATELET
Basophils Absolute: 0 10*3/uL (ref 0.0–0.1)
Basophils Relative: 1 % (ref 0–1)
Eosinophils Absolute: 0.4 10*3/uL (ref 0.0–0.7)
Eosinophils Relative: 6 % — ABNORMAL HIGH (ref 0–5)
MCH: 30.2 pg (ref 26.0–34.0)
MCV: 88.3 fL (ref 78.0–100.0)
Neutrophils Relative %: 34 % — ABNORMAL LOW (ref 43–77)
Platelets: 133 10*3/uL — ABNORMAL LOW (ref 150–400)
RBC: 4.11 MIL/uL — ABNORMAL LOW (ref 4.22–5.81)
RDW: 13.5 % (ref 11.5–15.5)

## 2013-09-01 LAB — URINALYSIS, ROUTINE W REFLEX MICROSCOPIC
Glucose, UA: NEGATIVE mg/dL
Hgb urine dipstick: NEGATIVE
Specific Gravity, Urine: 1.02 (ref 1.005–1.030)
Urobilinogen, UA: 0.2 mg/dL (ref 0.0–1.0)
pH: 6 (ref 5.0–8.0)

## 2013-09-01 LAB — CK TOTAL AND CKMB (NOT AT ARMC)
CK, MB: 2.5 ng/mL (ref 0.3–4.0)
Relative Index: 1.2 (ref 0.0–2.5)
Total CK: 217 U/L (ref 7–232)

## 2013-09-01 MED ORDER — OXYCODONE-ACETAMINOPHEN 5-325 MG PO TABS: 1.0000 | ORAL_TABLET | Freq: Four times a day (QID) | ORAL | Status: DC | PRN

## 2013-09-01 MED ORDER — HYDROMORPHONE HCL PF 1 MG/ML IJ SOLN
0.5000 mg | Freq: Once | INTRAMUSCULAR | Status: AC
  Administered 2013-09-01: 0.5 mg via INTRAVENOUS
  Filled 2013-09-01: qty 1

## 2013-09-01 MED ORDER — SODIUM CHLORIDE 0.9 % IV SOLN
INTRAVENOUS | Status: DC
  Administered 2013-09-01: 1000 mL via INTRAVENOUS

## 2013-09-01 MED ORDER — ONDANSETRON HCL 4 MG/2ML IJ SOLN
4.0000 mg | Freq: Once | INTRAMUSCULAR | Status: AC
  Administered 2013-09-01: 4 mg via INTRAVENOUS
  Filled 2013-09-01: qty 2

## 2013-09-01 NOTE — ED Provider Notes (Signed)
CSN: [id number]     Arrival date & time 09/01/13  1848 History   First MD Initiated Contact with Patient 09/01/13 1906     Chief Complaint  Patient presents with  . Back Pain   (Consider location/radiation/quality/duration/timing/severity/associated sxs/prior Treatment) HPI Comments: Patient presents to the ER for evaluation of back pain. Patient reports that he has a history of chronic back pain among other areas of arthralgias secondary to severe arthritis. Patient uses a fentanyl patch for this. Patient reports that over the last 3 weeks he has had progressively worsening pain not as well controlled on fentanyl. He changed his patch today around noon and has had progressive worsening pain since then. He thinks the patch hasn't "kicked in". There has not been any fall or injury. This reports that he has had progressive onset of generalized weakness over a period of several weeks. No specific neurologic or other symptoms associated.  Patient is a 79 y.o. male presenting with back pain.  Back Pain Associated symptoms: weakness     Past Medical History  Diagnosis Date  . DVT of leg (deep venous thrombosis) 1999    RLE   . Hypertension   . Hiatal hernia   . Lumbar spondylosis   . Thyroid nodule   . Goiter     stable x many year - last endo exam 08/2010  . Sleep apnea   . Type 2 diabetes mellitus   . Chronic kidney disease   . Diabetic retinopathy     right eye   . Diabetic neuropathy   . Osteopenia   . CAD (coronary artery disease)     (Left main normal, LAD 30-40% mid stenosis, diagonal 30-40% stenosis, circumflex 30-40% stenosis, right coronary artery 30-40% stenosis. 2003.)  . DVT (deep vein thrombosis) in pregnancy     1998  . Sleep apnea     CPAP  . GERD (gastroesophageal reflux disease)   . Diabetes mellitus   . Hyperlipidemia   . HTN (hypertension)   . Prostate cancer   . Chronic back pain    Past Surgical History  Procedure Laterality Date  . Tonsilectomy,  adenoidectomy, bilateral myringotomy and tubes    . Inguinal hernia repair      bilateral  . Knee arthroscopy      left   . Biopsy of right ear    . Cholecystectomy    . Cataract extraction, bilateral    . Tonsillectomy and adenoidectomy    . Appendectomy    . Inguinal hernia repair      Bilateral   Family History  Problem Relation Age of Onset  . Hyperlipidemia Mother   . Hypertension Mother   . Deep vein thrombosis Mother   . Prostate cancer Father   . Colon cancer Sister   . Cancer Father 7  . Pulmonary embolism Mother   . Diabetes Mother    History  Substance Use Topics  . Smoking status: Former Smoker    Types: Cigarettes, Pipe, Cigars    Quit date: 11/08/2003  . Smokeless tobacco: Former Neurosurgeon    Quit date: 11/08/2003  . Alcohol Use: No    Review of Systems  Musculoskeletal: Positive for back pain and arthralgias.  Neurological: Positive for weakness.  All other systems reviewed and are negative.    Allergies  Bactrim; Celebrex; Flagyl; Gabapentin; Lyrica; Lyrica; Morphine and related; Motrin; Nsaids; Nsaids; Ultram; Ultram; Vioxx; and Celebrex  Home Medications   Current Outpatient Rx  Name  Route  Sig  Dispense  Refill  . acetaminophen (TYLENOL) 500 MG tablet   Oral   Take 1,500 mg by mouth at bedtime as needed. For pain         . amLODipine (NORVASC) 5 MG tablet   Oral   Take 5 mg by mouth daily.           Marland Kitchen amLODipine (NORVASC) 5 MG tablet   Oral   Take 1 tablet (5 mg total) by mouth at bedtime.   30 tablet   5   . aspirin (ASPIRIN CHILDRENS) 81 MG chewable tablet   Oral   Chew 81 mg by mouth daily.           Marland Kitchen aspirin 81 MG tablet   Oral   Take 81 mg by mouth every morning.          . Cholecalciferol (VITAMIN D3) 2000 UNITS capsule   Oral   Take 2,000 Units by mouth 2 (two) times daily.          . Cholecalciferol (VITAMIN D3) 2000 UNITS TABS   Oral   Take by mouth daily. Take two tablets by mouth daily            .  docusate sodium (STOOL SOFTENER) 100 MG capsule   Oral   Take 300 mg by mouth 2 (two) times daily.         . fentaNYL (DURAGESIC - DOSED MCG/HR) 25 MCG/HR patch   Transdermal   Place 1 patch (25 mcg total) onto the skin every 3 (three) days.   10 patch   0   . Ferrous Fum-Iron Polysacch (TANDEM PO)   Oral   Take by mouth daily.           . finasteride (PROSCAR) 5 MG tablet      TAKE 1 TABLET BY MOUTH DAILY   90 tablet   1   . furosemide (LASIX) 40 MG tablet   Oral   Take 40 mg by mouth every morning.          Marland Kitchen levofloxacin (LEVAQUIN) 500 MG tablet               . Magnesium 250 MG TABS   Oral   Take by mouth daily.           . magnesium oxide (MAG-OX) 400 MG tablet   Oral   Take 1,600 mg by mouth at bedtime. INCLUDES: Zinc 15mg          . metFORMIN (GLUCOPHAGE) 500 MG tablet   Oral   Take 500 mg by mouth 2 (two) times daily with a meal.           . metFORMIN (GLUCOPHAGE-XR) 500 MG 24 hr tablet      TAKE 1 TABLET 3 TIMES A DAY   90 tablet   2   . metoprolol (TOPROL XL) 50 MG 24 hr tablet   Oral   Take 50 mg by mouth daily.           . Multiple Vitamins-Minerals (CENTRUM SILVER PO)   Oral   Take by mouth daily.           . multivitamin (THERAGRAN) per tablet   Oral   Take 1 tablet by mouth daily.           . nebivolol (BYSTOLIC) 10 MG tablet   Oral   Take 1 tablet (10 mg total) by mouth every morning.   90 tablet   0   .  Nutritional Supplements (GLUCOSAMINE COMPLEX PO)   Oral   Take by mouth 3 (three) times daily.           . Omega-3 Fatty Acids (FISH OIL) 1200 MG CAPS   Oral   Take 1 capsule by mouth 5 (five) times daily.         . Omega-3 Fatty Acids (OMEGA 3 PO)   Oral   Take by mouth. Take 5 tablets by mouth daily            . omeprazole (PRILOSEC) 20 MG capsule   Oral   Take 20 mg by mouth every morning.          . Omeprazole 20 MG TBEC   Oral   Take 20 mg by mouth daily.           . ramipril (ALTACE)  10 MG tablet   Oral   Take 10 mg by mouth daily.           . ramipril (ALTACE) 10 MG tablet   Oral   Take 1 tablet (10 mg total) by mouth every morning.   30 tablet   2   . rosuvastatin (CRESTOR) 10 MG tablet   Oral   Take 10 mg by mouth daily.           . rosuvastatin (CRESTOR) 5 MG tablet   Oral   Take by mouth daily. Take 1/2 tablet         . terazosin (HYTRIN) 10 MG capsule   Oral   Take 10 mg by mouth at bedtime.           Marland Kitchen terazosin (HYTRIN) 10 MG capsule      TAKE 1 CAPSULE AT BEDTIME   90 capsule   1   . tobramycin (TOBREX) 0.3 % ophthalmic solution               . TRADJENTA 5 MG TABS tablet      TAKE 1 TABLET (5 MG TOTAL) BY MOUTH EVERY MORNING.   30 tablet   3   . UNABLE TO FIND   Oral   Take 1 capsule by mouth every morning. Med Name: Earnest Bailey. Ingredients include: Meso-Zeaxanthin 10mg , Lutein 10mg , Zeaxanthin 2mg          . vitamin B-12 (CYANOCOBALAMIN) 1000 MCG tablet   Oral   Take 1,000 mcg by mouth daily.         Marland Kitchen zolpidem (AMBIEN) 5 MG tablet   Oral   Take 5 mg by mouth at bedtime as needed.            BP 105/74  Pulse 65  Temp(Src) 97.7 F (36.5 C) (Oral)  Resp 16  Ht 5\' 8"  (1.727 m)  Wt 232 lb 8 oz (105.461 kg)  BMI 35.36 kg/m2  SpO2 100% Physical Exam  Constitutional: He is oriented to person, place, and time. He appears well-developed and well-nourished. No distress.  HENT:  Head: Normocephalic and atraumatic.  Right Ear: Hearing normal.  Left Ear: Hearing normal.  Nose: Nose normal.  Mouth/Throat: Oropharynx is clear and moist and mucous membranes are normal.  Eyes: Conjunctivae and EOM are normal. Pupils are equal, round, and reactive to light.  Neck: Normal range of motion. Neck supple.  Cardiovascular: Regular rhythm, S1 normal and S2 normal.  Exam reveals no gallop and no friction rub.   No murmur heard. Pulmonary/Chest: Effort normal and breath sounds normal. No respiratory distress. He exhibits no  tenderness.  Abdominal: Soft. Normal appearance and bowel sounds are normal. There is no hepatosplenomegaly. There is no tenderness. There is no rebound, no guarding, no tenderness at McBurney's point and negative Murphy's sign. No hernia.  Musculoskeletal:       Right shoulder: He exhibits decreased range of motion.       Left shoulder: He exhibits decreased range of motion.       Lumbar back: He exhibits decreased range of motion, tenderness and spasm.  Neurological: He is alert and oriented to person, place, and time. He has normal strength. No cranial nerve deficit or sensory deficit. Coordination normal. GCS eye subscore is 4. GCS verbal subscore is 5. GCS motor subscore is 6.  Skin: Skin is warm, dry and intact. No rash noted. No cyanosis.  Psychiatric: He has a normal mood and affect. His speech is normal and behavior is normal. Thought content normal.    ED Course  Procedures (including critical care time)  EKG:  Date: 09/01/2013  Rate: 58  Rhythm: sinus bradycardia  QRS Axis: left  Intervals: PR prolonged  ST/T Wave abnormalities: normal  Conduction Disutrbances:first-degree A-V block   Narrative Interpretation:   Old EKG Reviewed: unchanged   Labs Review Labs Reviewed  CBC WITH DIFFERENTIAL - Abnormal; Notable for the following:    RBC 4.11 (*)    Hemoglobin 12.4 (*)    HCT 36.3 (*)    Platelets 133 (*)    Neutrophils Relative % 34 (*)    Lymphocytes Relative 54 (*)    Eosinophils Relative 6 (*)    All other components within normal limits  COMPREHENSIVE METABOLIC PANEL - Abnormal; Notable for the following:    Glucose, Bld 103 (*)    Creatinine, Ser 1.70 (*)    GFR calc non Af Amer 36 (*)    GFR calc Af Amer 42 (*)    All other components within normal limits  TROPONIN I  URINALYSIS, ROUTINE W REFLEX MICROSCOPIC  CK TOTAL AND CKMB   Imaging Review Ct Abdomen Pelvis Wo Contrast  09/01/2013   *RADIOLOGY REPORT*  Clinical Data: Neck pain.  History of prostate  cancer.  CT ABDOMEN AND PELVIS WITHOUT CONTRAST  Technique:  Multidetector CT imaging of the abdomen and pelvis was performed following the standard protocol without intravenous contrast.  Comparison: 09/21/2012  Findings: No focal abnormalities seen in the liver or spleen on this study performed without intravenous contrast material.  The stomach, duodenum, pancreas, and adrenal glands are unremarkable. The gallbladder is surgically absent.  Multiple low-density lesions in the right kidney are stable and measure water attenuation, most likely representing cysts.  Largest lesion in the right kidney is in the lower pole and measures 5.2 cm.  Multiple cystic lesions identified in the left kidney, also stable in the interval.  One of the larger lesions is in the interpolar region and measures 4.8 cm.  These lesions also measure in the density range of water and are most compatible with cysts.  No abdominal aortic aneurysm.  No free fluid or lymphadenopathy in the abdomen.  Imaging through the pelvis shows no free intraperitoneal fluid. There is no pelvic sidewall lymphadenopathy.  Left inguinal hernia contains only fat.  Diverticular changes are seen in the left colon without diverticulitis.  The terminal ileum is normal. The appendix is not visualized, but there is no edema or inflammation in the region of the cecum.  Small sclerotic focus in the right pubis is stable and likely represents a bone island.  IMPRESSION: No acute findings in the abdomen or pelvis.  Bilateral renal cysts.  Diverticulosis without diverticulitis.   Original Report Authenticated By: Kennith Center, M.D.    MDM  Diagnosis: Back pain  Patient presents to the ER for evaluation of back pain. Patient reports that he has a history of chronic back pain as well as diffuse arthritis. In the last few weeks his pain has gotten worse. He denies any new injury. Patient has been on for patch for approximately 4 years. He is on a relatively low dose at  25 mcg per hour. His daughter, however tells me that one time when the patch dose was increased, he became very oversedated.  Work up has been unremarkable. Blood work is normal. Urinalysis is clear. CT scan was performed to evaluate for back pain, including competitions from his prostate cancer such as lytic lesions. No acute abnormality is seen. Patient given pain medication here in the ER with improvement.  Patient will be started on oxycodone to be used as needed in addition to the fentanyl patch. He will follow up with primary doctor to establish home health and physical therapy visits. He understands that he might need to change his living arrangements if he continues to worsen.    Gilda Crease, MD 09/01/13 2109

## 2013-09-01 NOTE — ED Notes (Signed)
Ambulated patient to the nurses desk and back. Had very little assistance. Was able to tolerate pain while walking. JGW

## 2013-09-01 NOTE — ED Notes (Signed)
Trial ambulation, pt did well, able to walk 50 ft with minimal difficulty. Still has pain radiating down right thigh. daugher states that she and her sister have paln for home care. Pt already has hospital bed, and walker at home.

## 2013-09-01 NOTE — ED Notes (Signed)
Pt reports low back pain, he changed his fentanyl patch today at 12noon and thinks it is not working, denies any new or recent injury.

## 2013-09-06 ENCOUNTER — Telehealth: Payer: Self-pay | Admitting: Family Medicine

## 2013-09-08 NOTE — Telephone Encounter (Signed)
Appt scheduled with our clinical pharmacist for Monday 09/13/13. The patient is in severe pain and his daughters are concerned. Is there anyway we can increase the strength of his pain patch at this time? Dr. Modesto Charon will be out of the office until tomorrow and they have been trying to get help for 2 days.

## 2013-09-08 NOTE — Telephone Encounter (Signed)
Kristen please call the people that wanted to be called today to tell them that you'll have high Dr. Modesto Charon to address this the first thing in the morning----that he is not here today

## 2013-09-08 NOTE — Telephone Encounter (Signed)
Please get him an appointment with Babette Relic or Marcelino Duster for pain management review tomorrow. We may have to refer him to pain management clinic. Thanks Leiby Pigeon P. Modesto Charon, M.D.

## 2013-09-09 ENCOUNTER — Other Ambulatory Visit: Payer: Self-pay | Admitting: Family Medicine

## 2013-09-09 DIAGNOSIS — M4306 Spondylolysis, lumbar region: Secondary | ICD-10-CM

## 2013-09-09 MED ORDER — FENTANYL 50 MCG/HR TD PT72: 1.0000 | MEDICATED_PATCH | TRANSDERMAL | Status: DC

## 2013-09-09 NOTE — Telephone Encounter (Signed)
Rx ready for pick up. I increased the fentanyl patch. He needs an appointment with Tammy or Marcelino Duster to review his meds. He should hole the oxycodone because both the oxycodone and the fentanyl can cause respiratory depressionand an overdose . He also needs an appointment with me next week to reassess why he has suddenly more pain. Thanks. Nyanna Heideman P. Modesto Charon, M.D.

## 2013-09-09 NOTE — Telephone Encounter (Signed)
Spoke with pt daughter Waynetta Sandy -apptj given for Monday sept 15 and rx at front desk

## 2013-09-13 ENCOUNTER — Ambulatory Visit (INDEPENDENT_AMBULATORY_CARE_PROVIDER_SITE_OTHER): Payer: Medicare Other

## 2013-09-13 ENCOUNTER — Encounter: Payer: Self-pay | Admitting: Family Medicine

## 2013-09-13 ENCOUNTER — Ambulatory Visit (INDEPENDENT_AMBULATORY_CARE_PROVIDER_SITE_OTHER): Payer: Medicare Other | Admitting: Family Medicine

## 2013-09-13 ENCOUNTER — Ambulatory Visit: Payer: Medicare Other

## 2013-09-13 VITALS — BP 104/61 | HR 67 | Temp 98.1°F | Ht 68.5 in | Wt 230.8 lb

## 2013-09-13 DIAGNOSIS — H3530 Unspecified macular degeneration: Secondary | ICD-10-CM

## 2013-09-13 DIAGNOSIS — E785 Hyperlipidemia, unspecified: Secondary | ICD-10-CM

## 2013-09-13 DIAGNOSIS — I10 Essential (primary) hypertension: Secondary | ICD-10-CM

## 2013-09-13 DIAGNOSIS — M4310 Spondylolisthesis, site unspecified: Secondary | ICD-10-CM

## 2013-09-13 DIAGNOSIS — E119 Type 2 diabetes mellitus without complications: Secondary | ICD-10-CM

## 2013-09-13 DIAGNOSIS — C61 Malignant neoplasm of prostate: Secondary | ICD-10-CM

## 2013-09-13 DIAGNOSIS — M4306 Spondylolysis, lumbar region: Secondary | ICD-10-CM

## 2013-09-13 DIAGNOSIS — E1142 Type 2 diabetes mellitus with diabetic polyneuropathy: Secondary | ICD-10-CM

## 2013-09-13 DIAGNOSIS — E1140 Type 2 diabetes mellitus with diabetic neuropathy, unspecified: Secondary | ICD-10-CM

## 2013-09-13 DIAGNOSIS — I82409 Acute embolism and thrombosis of unspecified deep veins of unspecified lower extremity: Secondary | ICD-10-CM

## 2013-09-13 DIAGNOSIS — E1149 Type 2 diabetes mellitus with other diabetic neurological complication: Secondary | ICD-10-CM

## 2013-09-13 NOTE — Progress Notes (Signed)
Patient ID: [name], male   DOB: Jan 02, 1930, 79 y.o.   MRN: [id number] SUBJECTIVE: CC: Chief Complaint  Patient presents with  . Follow-up    NEEDS TO ADDRESS REFILLS AND [place]-2 PAPERS NEEDS COMPLETING   . Hospitalization Follow-up    was seen at [facility] for break thru pain concerned about pain meds     HPI: Here for follow up from his last visit to the ED. Oxycontin was added. He was afraid to add it and with the increased dose of the fentanyl patch.he is due today for an injection in his eye today. Came to have forms filled out for placement into assisted living. He is here with his daughter Jasmine December.  Past Medical History  Diagnosis Date  . DVT of leg (deep venous thrombosis) 1999    RLE   . Hypertension   . Hiatal hernia   . Lumbar spondylosis   . Thyroid nodule   . Goiter     stable x many year - last endo exam 08/2010  . Sleep apnea   . Type 2 diabetes mellitus   . Chronic kidney disease   . Diabetic retinopathy     right eye   . Diabetic neuropathy   . Osteopenia   . CAD (coronary artery disease)     (Left main normal, LAD 30-40% mid stenosis, diagonal 30-40% stenosis, circumflex 30-40% stenosis, right coronary artery 30-40% stenosis. 2003.)  . DVT (deep vein thrombosis) in pregnancy     1998  . Sleep apnea     CPAP  . GERD (gastroesophageal reflux disease)   . Diabetes mellitus   . Hyperlipidemia   . HTN (hypertension)   . Prostate cancer   . Chronic back pain    Past Surgical History  Procedure Laterality Date  . Tonsilectomy, adenoidectomy, bilateral myringotomy and tubes    . Inguinal hernia repair      bilateral  . Knee arthroscopy      left   . Biopsy of right ear    . Cholecystectomy    . Cataract extraction, bilateral    . Tonsillectomy and adenoidectomy    . Appendectomy    . Inguinal hernia repair      Bilateral   History   Social History  . Marital Status: Divorced    Spouse Name: N/A    Number of Children: 4  . Years of  Education: N/A   Occupational History  . Retired    Social History Main Topics  . Smoking status: Former Smoker    Types: Cigarettes, Pipe, Cigars    Quit date: 11/08/2003  . Smokeless tobacco: Former Neurosurgeon    Quit date: 11/08/2003  . Alcohol Use: No  . Drug Use: No  . Sexual Activity: No   Other Topics Concern  . Not on file   Social History Narrative   ** Merged History Encounter **       Family History  Problem Relation Age of Onset  . Hyperlipidemia Mother   . Hypertension Mother   . Deep vein thrombosis Mother   . Prostate cancer Father   . Colon cancer Sister   . Cancer Father 40  . Pulmonary embolism Mother   . Diabetes Mother     Allergies  Allergen Reactions  . Bactrim   . Celebrex [Celecoxib]   . Flagyl [Metronidazole Hcl]   . Gabapentin   . Lyrica [Pregabalin]   . Lyrica [Pregabalin]     Increase appetite   .  Morphine And Related   . Motrin [Ibuprofen]   . Nsaids   . Nsaids     Swelling    . Ultram [Tramadol Hcl]   . Ultram [Tramadol Hcl]     Itching / rash     . Vioxx [Rofecoxib]     Swelling    . Celebrex [Celecoxib] Rash   Immunization History  Administered Date(s) Administered  . Influenza Whole 10/30/2009  . Pneumococcal Polysaccharide 02/28/1999   Prior to Admission medications   Medication Sig Start Date End Date Taking? Authorizing Provider  acetaminophen (TYLENOL) 500 MG tablet Take 1,500 mg by mouth at bedtime as needed. For pain   Yes Historical Provider, MD  amLODipine (NORVASC) 5 MG tablet Take 5 mg by mouth daily.     Yes Historical Provider, MD  aspirin (ASPIRIN CHILDRENS) 81 MG chewable tablet Chew 81 mg by mouth daily.     Yes Historical Provider, MD  Cholecalciferol (VITAMIN D3) 2000 UNITS capsule Take 2,000 Units by mouth 2 (two) times daily.    Yes Historical Provider, MD  docusate sodium (STOOL SOFTENER) 100 MG capsule Take 300 mg by mouth 2 (two) times daily.   Yes Historical Provider, MD  fentaNYL (DURAGESIC) 50  MCG/HR Place 1 patch (50 mcg total) onto the skin every 3 (three) days. 09/09/13  Yes Ileana Ladd, MD  finasteride (PROSCAR) 5 MG tablet Take 5 mg by mouth daily.   Yes Historical Provider, MD  furosemide (LASIX) 20 MG tablet Take 40 mg by mouth daily.    Yes Historical Provider, MD  linagliptin (TRADJENTA) 5 MG TABS tablet Take 5 mg by mouth daily.   Yes Historical Provider, MD  magnesium oxide (MAG-OX) 400 MG tablet Take 1,600 mg by mouth at bedtime. INCLUDES: Zinc 15mg    Yes Historical Provider, MD  metFORMIN (GLUCOPHAGE-XR) 500 MG 24 hr tablet Take 500 mg by mouth 2 (two) times daily.   Yes Historical Provider, MD  nebivolol (BYSTOLIC) 10 MG tablet Take 1 tablet (10 mg total) by mouth every morning. 07/13/13  Yes Ernestina Penna, MD  Omega-3 Fatty Acids (FISH OIL) 1200 MG CAPS Take 1 capsule by mouth 5 (five) times daily.   Yes Historical Provider, MD  omeprazole (PRILOSEC) 20 MG capsule Take 20 mg by mouth every morning.  10/10/11  Yes Historical Provider, MD  oxyCODONE-acetaminophen (PERCOCET) 5-325 MG per tablet Take 1 tablet by mouth every 6 (six) hours as needed for pain. 09/01/13  Yes Gilda Crease, MD  ramipril (ALTACE) 10 MG tablet Take 10 mg by mouth daily.     Yes Historical Provider, MD  rosuvastatin (CRESTOR) 10 MG tablet Take 5 mg by mouth daily.    Yes Historical Provider, MD  terazosin (HYTRIN) 10 MG capsule Take 10 mg by mouth at bedtime.     Yes Historical Provider, MD  UNABLE TO FIND Take 1 capsule by mouth every morning Meso-Zeaxanthin 10mg , Lutein 10mg , Zeaxanthin 2mg    Yes Historical Provider, MD  vitamin B-12 (CYANOCOBALAMIN) 1000 MCG tablet Take 1,000 mcg by mouth daily.   Yes Historical Provider, MD  Magnesium 250 MG TABS Take by mouth daily.      Historical Provider, MD     ROS: As above in the HPI. All other systems are stable or negative.  OBJECTIVE: APPEARANCE:  Patient in no acute distress.The patient appeared well nourished and normally developed.  Acyanotic. Waist: VITAL SIGNS:BP 104/61  Pulse 67  Temp(Src) 98.1 F (36.7 C) (Oral)  Ht 5' 8.5" (  1.74 m)  Wt 230 lb 12.8 oz (104.69 kg)  BMI 34.58 kg/m2 WM unsteady gait.  SKIN: warm and  Dry without overt rashes, tattoos and scars  HEAD and Neck: without JVD, Head and scalp: normal Eyes:No scleral icterus. Ears: Auricle normal, canal normal, Tympanic membranes normal, insufflation normal. Nose: normal Throat: normal Neck & thyroid: normal  CHEST & LUNGS: Chest wall: normal Lungs: Clear  CVS: Reveals the PMI to be normally located. Regular rhythm, First and Second Heart sounds are normal,  absence of murmurs, rubs or gallops. Peripheral vasculature: Radial pulses: normal Dorsal pedis pulses: normal Posterior pulses: normal  ABDOMEN:  Appearance: obese Benign, no organomegaly, no masses, no Abdominal Aortic enlargement. No Guarding , no rebound. No Bruits. Bowel sounds: normal  RECTAL: N/A GU: N/A  EXTREMETIES: nonedematous.  MUSCULOSKELETAL:  Spine: normal Joints: intact  NEUROLOGIC: oriented to time,place and person; unsteady gait. Generalized weakness of the lower extremities. Ambulates with 2 canes. He ambulates at home with a walker.  ASSESSMENT: Diabetes mellitus, type 2  Diabetic neuropathy  DVT (deep venous thrombosis), unspecified laterality  HTN (hypertension)  Hyperlipidemia  Lumbar spondylolysis - Plan: DG Lumbar Spine Complete  Macular degeneration of both eyes  Prostate cancer - Plan: DG Lumbar Spine Complete  PLAN: Orders Placed This Encounter  Procedures  . DG Lumbar Spine Complete    Standing Status: Future     Number of Occurrences: 1     Standing Expiration Date: 11/13/2014    Order Specific Question:  Reason for Exam (SYMPTOM  OR DIAGNOSIS REQUIRED)    Answer:  spinal stenosis, prostate cancer    Order Specific Question:  Preferred imaging location?    Answer:  Internal   [facility] reading (PRIMARY) by  Dr. Modesto Charon: severe  degerative disease of the Lumbar spine with scoliosis. Spinal stenosis.  Reviewed medications. Use the higher dose of the fentanyl patches and hold the oxycontin.  Agree with placements. Will fill forms today.  Return for Recheck medical problems.   Eleftherios Dudenhoeffer P. Modesto Charon, M.D.

## 2013-09-14 NOTE — Progress Notes (Signed)
Quick Note:  Xray Abnormal. Needs a pelvic Xray to look closer if there is Something happening in the pelvic bone. See me in the next few days or early next week. ______

## 2013-09-16 ENCOUNTER — Telehealth: Payer: Self-pay | Admitting: Family Medicine

## 2013-09-16 ENCOUNTER — Other Ambulatory Visit: Payer: Self-pay | Admitting: Family Medicine

## 2013-09-16 DIAGNOSIS — R937 Abnormal findings on diagnostic imaging of other parts of musculoskeletal system: Secondary | ICD-10-CM

## 2013-09-16 NOTE — Telephone Encounter (Signed)
CT ordered. 

## 2013-09-16 NOTE — Telephone Encounter (Signed)
Spoke with pt and would like CT done [facility]

## 2013-09-17 ENCOUNTER — Telehealth: Payer: Self-pay | Admitting: Family Medicine

## 2013-09-17 NOTE — Telephone Encounter (Signed)
The findings in the pelvis is new for the last 2 weeks. Need to look again

## 2013-09-17 NOTE — Telephone Encounter (Signed)
Spoke with [name] his daughter and wonders why another CT -he had CT on 09-02-13

## 2013-09-20 ENCOUNTER — Other Ambulatory Visit: Payer: Self-pay | Admitting: Family Medicine

## 2013-09-20 NOTE — Telephone Encounter (Signed)
Daughter Beth notified

## 2013-09-26 ENCOUNTER — Other Ambulatory Visit: Payer: Self-pay | Admitting: Family Medicine

## 2013-09-28 ENCOUNTER — Ambulatory Visit (INDEPENDENT_AMBULATORY_CARE_PROVIDER_SITE_OTHER): Payer: Medicare Other | Admitting: Pharmacist Clinician (PhC)/ Clinical Pharmacy Specialist

## 2013-09-28 ENCOUNTER — Ambulatory Visit (INDEPENDENT_AMBULATORY_CARE_PROVIDER_SITE_OTHER): Payer: Medicare Other

## 2013-09-28 ENCOUNTER — Other Ambulatory Visit: Payer: Self-pay | Admitting: Family Medicine

## 2013-09-28 ENCOUNTER — Other Ambulatory Visit (HOSPITAL_COMMUNITY): Payer: Medicare Other

## 2013-09-28 DIAGNOSIS — M549 Dorsalgia, unspecified: Secondary | ICD-10-CM

## 2013-09-28 DIAGNOSIS — Q782 Osteopetrosis: Secondary | ICD-10-CM

## 2013-09-28 NOTE — Progress Notes (Signed)
Patient comes in today with his daughter for a pain management visit.  Patient was increased to fentanyl patches and has seen very little improvement in pain control despite the addition of oxycodone 5mg  from the hospital.  Patient admits that he did not change his fentanyl patches on schedule and often went well beyond the 72 hours time period.  He has not experienced any additional adverse effects with the dose increase in his fentanyl patches.  He also brought in his home blood glucose and BP readings for review.  His BG are averaging around 110mg /dl with no hypoglycemia.  Patient will need a serum creatinine check since he is taking metformin.  His BP readings reveal diastyolic readings as low as 48 and averaging in the high 50's to low 60's.  Systolic readings are in the 90's.  He does have postural hypotension and dizziness.  I advised patient to cut Bystolic dose in half from 10mg  to 5mg .  He also recently had Crestor reduced to 1/2 tablet of 10mg  a day thus I wrote a new prescription for the 5mg  strength of Crestor for patient with 3 refills.  He is to call me in 1 week to let me know how is pain control is and BP readings.  I gave patient two options:  Increase to fentanyl patches (he has both 50 and at home) with no percocet; or stay on 50 mg strength of fentanyl and take #2 percocet for breakthrough pain.  He prefers to take the fentanyl patches.  Cautioned on adverse effects that could be experienced with dose increase and to be careful with dizziness and falls.

## 2013-10-01 ENCOUNTER — Ambulatory Visit (HOSPITAL_COMMUNITY): Payer: Medicare Other

## 2013-10-04 ENCOUNTER — Other Ambulatory Visit: Payer: Self-pay

## 2013-10-04 MED ORDER — AMLODIPINE BESYLATE 5 MG PO TABS: 5.0000 mg | ORAL_TABLET | Freq: Every day | ORAL | Status: DC

## 2013-10-04 MED ORDER — NEBIVOLOL HCL 10 MG PO TABS: 10.0000 mg | ORAL_TABLET | Freq: Every morning | ORAL | Status: DC

## 2013-10-04 MED ORDER — LINAGLIPTIN 5 MG PO TABS: 5.0000 mg | ORAL_TABLET | Freq: Every day | ORAL | Status: DC

## 2013-10-04 MED ORDER — FINASTERIDE 5 MG PO TABS: 5.0000 mg | ORAL_TABLET | Freq: Every day | ORAL | Status: DC

## 2013-10-04 MED ORDER — RAMIPRIL 10 MG PO CAPS: 10.0000 mg | ORAL_CAPSULE | Freq: Every day | ORAL | Status: DC

## 2013-10-04 MED ORDER — FUROSEMIDE 40 MG PO TABS: 40.0000 mg | ORAL_TABLET | Freq: Every day | ORAL | Status: DC

## 2013-10-04 MED ORDER — ROSUVASTATIN CALCIUM 10 MG PO TABS: 10.0000 mg | ORAL_TABLET | Freq: Every day | ORAL | Status: DC

## 2013-10-04 MED ORDER — METFORMIN HCL ER 500 MG PO TB24: 500.0000 mg | ORAL_TABLET | Freq: Two times a day (BID) | ORAL | Status: DC

## 2013-10-07 ENCOUNTER — Telehealth: Payer: Self-pay | Admitting: Pharmacist

## 2013-10-07 MED ORDER — NEBIVOLOL HCL 5 MG PO TABS: 5.0000 mg | ORAL_TABLET | Freq: Every day | ORAL | Status: DC

## 2013-10-07 NOTE — Telephone Encounter (Signed)
Spoke with patient about BP readings at home.  10-1   115/55 10-2   116/61 10-3    124/70 10-4    138/66 10-5   125/63 10-6    140/66 10-7   126/66 10-8   127/67 10-9    129/70  DBP improving since decreased bystolic from 10mg  daily to 5mg  daily.  Patient also reports less dizziness over last 2-3 days.   He did try fentanyl + patches 09/28/13 but he felt increase in lightheadedness and itching so on 10/2 he removed the patch.  He is currently only using fentanyl patches every 72 hours.  His pain level is stable per patient.   Rx for Bystolic 5mg  sent to [facility] He will need rx for fentanyl next week and will get at appt 10/16 with Dr Modesto Charon.

## 2013-10-08 ENCOUNTER — Encounter (HOSPITAL_COMMUNITY): Payer: Self-pay | Admitting: Emergency Medicine

## 2013-10-08 ENCOUNTER — Telehealth: Payer: Self-pay | Admitting: Family Medicine

## 2013-10-08 ENCOUNTER — Emergency Department (HOSPITAL_COMMUNITY)
Admission: EM | Admit: 2013-10-08 | Discharge: 2013-10-08 | Disposition: A | Discharge status: Home / Self Care | Payer: Medicare Other | Attending: Emergency Medicine | Admitting: Emergency Medicine

## 2013-10-08 DIAGNOSIS — G8929 Other chronic pain: Secondary | ICD-10-CM

## 2013-10-08 DIAGNOSIS — E1149 Type 2 diabetes mellitus with other diabetic neurological complication: Secondary | ICD-10-CM | POA: Insufficient documentation

## 2013-10-08 DIAGNOSIS — E785 Hyperlipidemia, unspecified: Secondary | ICD-10-CM | POA: Insufficient documentation

## 2013-10-08 DIAGNOSIS — Z79899 Other long term (current) drug therapy: Secondary | ICD-10-CM | POA: Insufficient documentation

## 2013-10-08 DIAGNOSIS — Z7982 Long term (current) use of aspirin: Secondary | ICD-10-CM | POA: Insufficient documentation

## 2013-10-08 DIAGNOSIS — M545 Low back pain, unspecified: Secondary | ICD-10-CM | POA: Insufficient documentation

## 2013-10-08 DIAGNOSIS — I129 Hypertensive chronic kidney disease with stage 1 through stage 4 chronic kidney disease, or unspecified chronic kidney disease: Secondary | ICD-10-CM | POA: Insufficient documentation

## 2013-10-08 DIAGNOSIS — M129 Arthropathy, unspecified: Secondary | ICD-10-CM | POA: Insufficient documentation

## 2013-10-08 DIAGNOSIS — Z86718 Personal history of other venous thrombosis and embolism: Secondary | ICD-10-CM | POA: Insufficient documentation

## 2013-10-08 DIAGNOSIS — E1142 Type 2 diabetes mellitus with diabetic polyneuropathy: Secondary | ICD-10-CM | POA: Insufficient documentation

## 2013-10-08 DIAGNOSIS — K219 Gastro-esophageal reflux disease without esophagitis: Secondary | ICD-10-CM | POA: Insufficient documentation

## 2013-10-08 DIAGNOSIS — Z87891 Personal history of nicotine dependence: Secondary | ICD-10-CM | POA: Insufficient documentation

## 2013-10-08 DIAGNOSIS — Z8546 Personal history of malignant neoplasm of prostate: Secondary | ICD-10-CM | POA: Insufficient documentation

## 2013-10-08 DIAGNOSIS — G4730 Sleep apnea, unspecified: Secondary | ICD-10-CM | POA: Insufficient documentation

## 2013-10-08 DIAGNOSIS — N189 Chronic kidney disease, unspecified: Secondary | ICD-10-CM | POA: Insufficient documentation

## 2013-10-08 DIAGNOSIS — E1139 Type 2 diabetes mellitus with other diabetic ophthalmic complication: Secondary | ICD-10-CM | POA: Insufficient documentation

## 2013-10-08 DIAGNOSIS — E11319 Type 2 diabetes mellitus with unspecified diabetic retinopathy without macular edema: Secondary | ICD-10-CM | POA: Insufficient documentation

## 2013-10-08 DIAGNOSIS — I2510 Atherosclerotic heart disease of native coronary artery without angina pectoris: Secondary | ICD-10-CM | POA: Insufficient documentation

## 2013-10-08 LAB — CBC WITH DIFFERENTIAL/PLATELET
Basophils Relative: 1 % (ref 0–1)
Eosinophils Absolute: 0.2 10*3/uL (ref 0.0–0.7)
Lymphs Abs: 2.5 10*3/uL (ref 0.7–4.0)
MCH: 30.4 pg (ref 26.0–34.0)
MCHC: 34.6 g/dL (ref 30.0–36.0)
Neutro Abs: 2.4 10*3/uL (ref 1.7–7.7)
Neutrophils Relative %: 45 % (ref 43–77)
Platelets: 125 10*3/uL — ABNORMAL LOW (ref 150–400)
RBC: 4.15 MIL/uL — ABNORMAL LOW (ref 4.22–5.81)

## 2013-10-08 LAB — BASIC METABOLIC PANEL
Chloride: 102 mEq/L (ref 96–112)
GFR calc Af Amer: 58 mL/min — ABNORMAL LOW (ref >90)
GFR calc non Af Amer: 50 mL/min — ABNORMAL LOW (ref >90)
Potassium: 4.2 mEq/L (ref 3.5–5.1)
Sodium: 139 mEq/L (ref 135–145)

## 2013-10-08 LAB — URINALYSIS, ROUTINE W REFLEX MICROSCOPIC
Leukocytes, UA: NEGATIVE
Nitrite: NEGATIVE
Specific Gravity, Urine: 1.015 (ref 1.005–1.030)
Urobilinogen, UA: 0.2 mg/dL (ref 0.0–1.0)

## 2013-10-08 MED ORDER — SODIUM CHLORIDE 0.9 % IV SOLN
INTRAVENOUS | Status: DC
  Administered 2013-10-08: 12:00:00 via INTRAVENOUS

## 2013-10-08 MED ORDER — FENTANYL CITRATE 0.05 MG/ML IJ SOLN
50.0000 ug | INTRAMUSCULAR | Status: AC | PRN
  Administered 2013-10-08 (×2): 50 ug via INTRAVENOUS
  Filled 2013-10-08 (×2): qty 2

## 2013-10-08 NOTE — ED Notes (Signed)
Assisted patient with urinal.

## 2013-10-08 NOTE — ED Provider Notes (Addendum)
CSN: [id number]     Arrival date & time 10/08/13  1101 History  This chart was scribed for Celene Kras, MD by Bennett Scrape, ED Scribe. This patient was seen in room APA08/APA08 and the patient's care was started at 11:22 AM.   Chief Complaint  Patient presents with  . Back Pain   The history is provided by the patient. No language interpreter was used.    HPI Comments: [name] is a 79 y.o. male who presents to the Emergency Department by ambulance complaining of chronic lower back pain that is felt constantly due to severe arthritis. The pain is aggravated with movement and ambulation. At baseline, he walks with a walker but cannot ambulate far due to the pain. Pt reports that he wears a fentayl patch and takes 25 mg and 50 mg OxyContin PRN as well but neither is improving his pain. He admits that he does not take the pain medication often due to living alone. He reports that he tried taking 75 mg of OxyContin at one time but became "too weak" and has not taken a dose that strong since then. He states that he took one 25 mg around 2 hours ago with mild improvement. He has been following up closely with his PCP for the same and the last x-ray done showed severe arthritis. He denies abdominal pain, CP and SOB as associated symptoms.  Dr. Modesto Charon with [facility] is PCP.   Past Medical History  Diagnosis Date  . DVT of leg (deep venous thrombosis) 1999    RLE   . Hypertension   . Hiatal hernia   . Lumbar spondylosis   . Thyroid nodule   . Goiter     stable x many year - last endo exam 08/2010  . Sleep apnea   . Type 2 diabetes mellitus   . Chronic kidney disease   . Diabetic retinopathy     right eye   . Diabetic neuropathy   . Osteopenia   . CAD (coronary artery disease)     (Left main normal, LAD 30-40% mid stenosis, diagonal 30-40% stenosis, circumflex 30-40% stenosis, right coronary artery 30-40% stenosis. 2003.)  . Sleep apnea     CPAP  . GERD (gastroesophageal  reflux disease)   . Diabetes mellitus   . Hyperlipidemia   . HTN (hypertension)   . Prostate cancer   . Chronic back pain   . DVT (deep venous thrombosis)    Past Surgical History  Procedure Laterality Date  . Tonsilectomy, adenoidectomy, bilateral myringotomy and tubes    . Inguinal hernia repair      bilateral  . Knee arthroscopy      left   . Biopsy of right ear    . Cholecystectomy    . Cataract extraction, bilateral    . Tonsillectomy and adenoidectomy    . Appendectomy    . Inguinal hernia repair      Bilateral   Family History  Problem Relation Age of Onset  . Hyperlipidemia Mother   . Hypertension Mother   . Deep vein thrombosis Mother   . Prostate cancer Father   . Colon cancer Sister   . Cancer Father 46  . Pulmonary embolism Mother   . Diabetes Mother    History  Substance Use Topics  . Smoking status: Former Smoker    Types: Cigarettes, Pipe, Cigars    Quit date: 11/08/2003  . Smokeless tobacco: Former Neurosurgeon    Quit date: 11/08/2003  .  Alcohol Use: No    Review of Systems  Respiratory: Negative for shortness of breath.   Cardiovascular: Negative for chest pain.  Gastrointestinal: Negative for abdominal pain.  Musculoskeletal: Positive for back pain (chronic).  A complete 10 system review of systems was obtained and all systems are negative except as noted in the HPI and PMH.   Allergies  Actos; Bactrim; Celebrex; Flagyl; Gabapentin; Lyrica; Lyrica; Morphine and related; Motrin; Nsaids; Nsaids; Ultram; Ultram; Vioxx; and Celebrex  Home Medications   Current Outpatient Rx  Name  Route  Sig  Dispense  Refill  . acetaminophen (TYLENOL) 500 MG tablet   Oral   Take 1,500 mg by mouth at bedtime as needed. For pain         . amLODipine (NORVASC) 5 MG tablet   Oral   Take 1 tablet (5 mg total) by mouth daily.   90 tablet   1   . aspirin (ASPIRIN CHILDRENS) 81 MG chewable tablet   Oral   Chew 81 mg by mouth daily.           .  Cholecalciferol (VITAMIN D3) 2000 UNITS capsule   Oral   Take 2,000 Units by mouth 2 (two) times daily.          Marland Kitchen docusate sodium (STOOL SOFTENER) 100 MG capsule   Oral   Take 300 mg by mouth 2 (two) times daily.         . fentaNYL (DURAGESIC) 50 MCG/HR   Transdermal   Place 1 patch (50 mcg total) onto the skin every 3 (three) days.   10 patch   0   . finasteride (PROSCAR) 5 MG tablet   Oral   Take 1 tablet (5 mg total) by mouth daily.   90 tablet   1   . furosemide (LASIX) 40 MG tablet   Oral   Take 1 tablet (40 mg total) by mouth daily.   90 tablet   1   . linagliptin (TRADJENTA) 5 MG TABS tablet   Oral   Take 1 tablet (5 mg total) by mouth daily.   90 tablet   1   . Magnesium 250 MG TABS   Oral   Take by mouth daily.           . magnesium oxide (MAG-OX) 400 MG tablet   Oral   Take 1,600 mg by mouth at bedtime. INCLUDES: Zinc 15mg          . metFORMIN (GLUCOPHAGE-XR) 500 MG 24 hr tablet   Oral   Take 500 mg by mouth 3 (three) times daily.         . nebivolol (BYSTOLIC) 5 MG tablet   Oral   Take 1 tablet (5 mg total) by mouth daily.   30 tablet   2     Do not deliver - patient will pick up.  Please not ...   . Omega-3 Fatty Acids (FISH OIL) 1200 MG CAPS   Oral   Take 1 capsule by mouth 5 (five) times daily.         Marland Kitchen omeprazole (PRILOSEC) 20 MG capsule   Oral   Take 20 mg by mouth every morning.          Marland Kitchen oxyCODONE-acetaminophen (PERCOCET) 5-325 MG per tablet   Oral   Take 1 tablet by mouth every 6 (six) hours as needed for pain.   30 tablet   0   . ramipril (ALTACE) 10 MG capsule   Oral  Take 1 capsule (10 mg total) by mouth daily.   90 capsule   1   . rosuvastatin (CRESTOR) 10 MG tablet   Oral   Take 5 mg by mouth daily.          Marland Kitchen terazosin (HYTRIN) 10 MG capsule   Oral   Take 10 mg by mouth at bedtime.           Marland Kitchen UNABLE TO FIND   Oral   Take 1 capsule by mouth every morning. Med Name: Earnest Bailey. Ingredients  include: Meso-Zeaxanthin 10mg , Lutein 10mg , Zeaxanthin 2mg          . vitamin B-12 (CYANOCOBALAMIN) 1000 MCG tablet   Oral   Take 1,000 mcg by mouth daily.          There were no vitals taken for this visit.  Physical Exam  Nursing note and vitals reviewed. Constitutional:  Frail, elderly   HENT:  Head: Normocephalic and atraumatic.  Right Ear: External ear normal.  Left Ear: External ear normal.  Nose: Nose normal.  Eyes: Conjunctivae and EOM are normal.  Neck: Neck supple. No tracheal deviation present.  Pulmonary/Chest: Effort normal. No stridor. No respiratory distress.  Musculoskeletal: He exhibits no edema and no tenderness.       Lumbar back: He exhibits decreased range of motion, pain and spasm. He exhibits no swelling and no edema.  Tenderness to palpation in lumbar and paraspinal lumbar region, pain with attempting to lift legs off of the bed, unable to sit up in bed without pain  Neurological: He is alert. He is not disoriented. No cranial nerve deficit or sensory deficit. He exhibits normal muscle tone. Coordination normal.  Skin: Skin is warm and dry. No rash noted. He is not diaphoretic. No erythema.  Psychiatric: He has a normal mood and affect. His behavior is normal. Thought content normal.    ED Course  Procedures (including critical care time)  Medications  0.9 %  sodium chloride infusion ( Intravenous New Bag/Given 10/08/13 1139)  fentaNYL (SUBLIMAZE) injection 50 mcg (50 mcg Intravenous Given 10/08/13 1141)    DIAGNOSTIC STUDIES: None performed   COORDINATION OF CARE: 11:30 AM-Reviewed pt's prior records in the room. Last x-ray showed severe arthritis. Last MRI last year showed arthritis and spinal stenosis. Discussed treatment plan which includes pain medication, CBC panel, BMP and UA with pt at bedside and pt agreed to plan.    Labs Review Labs Reviewed  CBC WITH DIFFERENTIAL - Abnormal; Notable for the following:    RBC 4.15 (*)    Hemoglobin  12.6 (*)    HCT 36.4 (*)    Platelets 125 (*)    Lymphocytes Relative 47 (*)    All other components within normal limits  BASIC METABOLIC PANEL - Abnormal; Notable for the following:    Glucose, Bld 127 (*)    GFR calc non Af Amer 50 (*)    GFR calc Af Amer 58 (*)    All other components within normal limits  URINALYSIS, ROUTINE W REFLEX MICROSCOPIC   Imaging Review No results found.  EKG Interpretation   None      1416  I discussed the findings with the patient in the family. They're concerned about the patient's ability to lives at home. They're interested in placement nursing facility. They had already contacted a nursing home and they were told to come to the hospital for an FL2 form. I will contact the social worker to see if they can assist  [place]. MDM   1. Chronic back pain    Pt has been having chronic back pain issues.  No signs of infection or acute neurosurgical emergency.  I personally performed the services described in this documentation, which was scribed in my presence.  The recorded information has been reviewed and is accurate.   Celene Kras, MD 10/08/13 [phone]  1614  The social worker was contacted earlier.  She was planning on coming down to see the patient and family to see if we could assist with nursing home placement.  Family decided that they did not want to wait any longer and have decided to leave.   Celene Kras, MD 10/08/13 [phone]

## 2013-10-08 NOTE — ED Notes (Signed)
Patient with no complaints at this time. Respirations even and unlabored. Skin warm/dry. Discharge instructions reviewed with patient at this time. Patient given opportunity to voice concerns/ask questions. IV removed per policy and band-aid applied to site. Patient discharged at this time and left Emergency Department with steady gait.  

## 2013-10-08 NOTE — ED Notes (Signed)
Social worker at bedside.

## 2013-10-08 NOTE — Telephone Encounter (Signed)
Severe uncontrolled pain and cannot walk today. Patient is known to have  Severe lumbar disease/spinal stenosis. Increased fentanyl causes significant sedation. Cannot stand and walk. And too groggy and needs more analgesia. Assess Patient with prostate cancer and severe spinal disease with uncontrolled pain. Plan: recommend evaluation in the ED setting and possible admit for pain control. Risk of respiratory depression and arrest high as an out patient over the weekend. I suspect that his spinal disease has progressed. And risk for cauda equina syndrome is a possibility.  Jeniya Flannigan P. Modesto Charon, M.D.

## 2013-10-08 NOTE — ED Notes (Signed)
Patient arrives via EMS with c/o back pain and increased pain over last couple days. Patient with h/o prostate cancer. New fentanyl patch applied this morning by patient at 0400.

## 2013-10-14 ENCOUNTER — Ambulatory Visit: Payer: Medicare Other | Admitting: Family Medicine

## 2013-10-26 ENCOUNTER — Telehealth: Payer: Self-pay | Admitting: Family Medicine

## 2013-10-28 ENCOUNTER — Other Ambulatory Visit: Payer: Self-pay

## 2013-10-28 NOTE — Telephone Encounter (Signed)
Last seen 09/13/13  [facility]  IF approved print and route to nurse

## 2013-10-28 NOTE — Telephone Encounter (Signed)
Pt aware appt nov 3 at 3:20

## 2013-10-29 MED ORDER — FENTANYL 50 MCG/HR TD PT72: 1.0000 | MEDICATED_PATCH | TRANSDERMAL | Status: DC

## 2013-10-29 NOTE — Telephone Encounter (Signed)
PT AND DAUGHTER  BETH AWARE TO PICK UP RX AT FRONT DESK

## 2013-10-29 NOTE — Telephone Encounter (Signed)
Rx ready for pick up. 

## 2013-11-01 ENCOUNTER — Encounter: Payer: Self-pay | Admitting: Family Medicine

## 2013-11-01 ENCOUNTER — Encounter (INDEPENDENT_AMBULATORY_CARE_PROVIDER_SITE_OTHER): Payer: Self-pay

## 2013-11-01 ENCOUNTER — Ambulatory Visit (INDEPENDENT_AMBULATORY_CARE_PROVIDER_SITE_OTHER): Payer: Medicare Other | Admitting: Family Medicine

## 2013-11-01 VITALS — HR 83 | Temp 97.1°F | Ht 68.0 in | Wt 219.4 lb

## 2013-11-01 DIAGNOSIS — H3530 Unspecified macular degeneration: Secondary | ICD-10-CM

## 2013-11-01 DIAGNOSIS — I2510 Atherosclerotic heart disease of native coronary artery without angina pectoris: Secondary | ICD-10-CM

## 2013-11-01 DIAGNOSIS — E1140 Type 2 diabetes mellitus with diabetic neuropathy, unspecified: Secondary | ICD-10-CM

## 2013-11-01 DIAGNOSIS — E11319 Type 2 diabetes mellitus with unspecified diabetic retinopathy without macular edema: Secondary | ICD-10-CM

## 2013-11-01 DIAGNOSIS — E1142 Type 2 diabetes mellitus with diabetic polyneuropathy: Secondary | ICD-10-CM

## 2013-11-01 DIAGNOSIS — E1149 Type 2 diabetes mellitus with other diabetic neurological complication: Secondary | ICD-10-CM

## 2013-11-01 DIAGNOSIS — G4730 Sleep apnea, unspecified: Secondary | ICD-10-CM

## 2013-11-01 DIAGNOSIS — E785 Hyperlipidemia, unspecified: Secondary | ICD-10-CM

## 2013-11-01 DIAGNOSIS — E119 Type 2 diabetes mellitus without complications: Secondary | ICD-10-CM

## 2013-11-01 DIAGNOSIS — M4310 Spondylolisthesis, site unspecified: Secondary | ICD-10-CM

## 2013-11-01 DIAGNOSIS — C61 Malignant neoplasm of prostate: Secondary | ICD-10-CM

## 2013-11-01 DIAGNOSIS — I10 Essential (primary) hypertension: Secondary | ICD-10-CM

## 2013-11-01 DIAGNOSIS — M4306 Spondylolysis, lumbar region: Secondary | ICD-10-CM

## 2013-11-01 DIAGNOSIS — I82409 Acute embolism and thrombosis of unspecified deep veins of unspecified lower extremity: Secondary | ICD-10-CM

## 2013-11-01 DIAGNOSIS — N289 Disorder of kidney and ureter, unspecified: Secondary | ICD-10-CM

## 2013-11-01 DIAGNOSIS — E041 Nontoxic single thyroid nodule: Secondary | ICD-10-CM

## 2013-11-01 DIAGNOSIS — K449 Diaphragmatic hernia without obstruction or gangrene: Secondary | ICD-10-CM

## 2013-11-01 DIAGNOSIS — E1139 Type 2 diabetes mellitus with other diabetic ophthalmic complication: Secondary | ICD-10-CM

## 2013-11-01 MED ORDER — OXYCODONE-ACETAMINOPHEN 5-325 MG PO TABS: 1.0000 | ORAL_TABLET | Freq: Three times a day (TID) | ORAL | Status: DC | PRN

## 2013-11-01 NOTE — Patient Instructions (Signed)
      Dr Tip Atienza's Recommendations  For nutrition information, I recommend books:  1).Eat to Live by Dr Joel Fuhrman. 2).Prevent and Reverse Heart Disease by Dr Caldwell Esselstyn. 3) Dr Neal Barnard's Book:  Program to Reverse Diabetes  Exercise recommendations are:  If unable to walk, then the patient can exercise in a chair 3 times a day. By flapping arms like a bird gently and raising legs outwards to the front.  If ambulatory, the patient can go for walks for 30 minutes 3 times a week. Then increase the intensity and duration as tolerated.  Goal is to try to attain exercise frequency to 5 times a week.  If applicable: Best to perform resistance exercises (machines or weights) 2 days a week and cardio type exercises 3 days per week.  

## 2013-11-01 NOTE — Progress Notes (Signed)
SUBJECTIVE: CC: Chief Complaint  Patient presents with  . Follow-up    [facility]  .  c/o "annoying cough "  . Medication Refill    refill oxycodone     HPI: Patient recently d/c from nursing home for  Rehab for his low back pain which is due to spinal stenosis. He is accompanied by his daughter and the comment is that this is the best he has been for a couple of years. He is more active and he is trying to eat better and he has lost 11 Lbs.  Reviewed his multiple medical problems. He needs a refill of his breakthrough pain pills.  Past Medical History  Diagnosis Date  . DVT of leg (deep venous thrombosis) 1999    RLE   . Hypertension   . Hiatal hernia   . Lumbar spondylosis   . Thyroid nodule   . Goiter     stable x many year - last endo exam 08/2010  . Sleep apnea   . Type 2 diabetes mellitus   . Chronic kidney disease   . Diabetic retinopathy     right eye   . Diabetic neuropathy   . Osteopenia   . CAD (coronary artery disease)     (Left main normal, LAD 30-40% mid stenosis, diagonal 30-40% stenosis, circumflex 30-40% stenosis, right coronary artery 30-40% stenosis. 2003.)  . Sleep apnea     CPAP  . GERD (gastroesophageal reflux disease)   . Diabetes mellitus   . Hyperlipidemia   . HTN (hypertension)   . Prostate cancer   . Chronic back pain   . DVT (deep venous thrombosis)    Past Surgical History  Procedure Laterality Date  . Tonsilectomy, adenoidectomy, bilateral myringotomy and tubes    . Inguinal hernia repair      bilateral  . Knee arthroscopy      left   . Biopsy of right ear    . Cholecystectomy    . Cataract extraction, bilateral    . Tonsillectomy and adenoidectomy    . Appendectomy    . Inguinal hernia repair      Bilateral   History   Social History  . Marital Status: Divorced    Spouse Name: N/A    Number of Children: 4  . Years of Education: N/A   Occupational History  . Retired    Social History Main  Topics  . Smoking status: Former Smoker    Types: Cigarettes, Pipe, Cigars    Quit date: 11/08/2003  . Smokeless tobacco: Former Neurosurgeon    Quit date: 11/08/2003  . Alcohol Use: No  . Drug Use: No  . Sexual Activity: No   Other Topics Concern  . Not on file   Social History Narrative   ** Merged History Encounter **       Family History  Problem Relation Age of Onset  . Hyperlipidemia Mother   . Hypertension Mother   . Deep vein thrombosis Mother   . Prostate cancer Father   . Colon cancer Sister   . Cancer Father 63  . Pulmonary embolism Mother   . Diabetes Mother     Allergies  Allergen Reactions  . Actos [Pioglitazone]     Allergic reaction  . Bactrim   . Celebrex [Celecoxib]   . Flagyl [Metronidazole Hcl]   . Gabapentin   . Lyrica [Pregabalin]   . Lyrica [Pregabalin]     Increase appetite   . Morphine And  Related   . Motrin [Ibuprofen]   . Nsaids   . Nsaids     Swelling    . Ultram [Tramadol Hcl]   . Ultram [Tramadol Hcl]     Itching / rash     . Vioxx [Rofecoxib]     Swelling    . Celebrex [Celecoxib] Rash   Immunization History  Administered Date(s) Administered  . Influenza Whole 10/30/2009  . Pneumococcal Polysaccharide 02/28/1999   Prior to Admission medications   Medication Sig Start Date End Date Taking? Authorizing Provider  acetaminophen (TYLENOL) 500 MG tablet Take 1,500 mg by mouth at bedtime as needed. For pain    Historical Provider, MD  amLODipine (NORVASC) 5 MG tablet Take 1 tablet (5 mg total) by mouth daily. 10/04/13   Ileana Ladd, MD  aspirin (ASPIRIN CHILDRENS) 81 MG chewable tablet Chew 81 mg by mouth daily.      Historical Provider, MD  Cholecalciferol (VITAMIN D3) 2000 UNITS capsule Take 2,000 Units by mouth 2 (two) times daily.     Historical Provider, MD  docusate sodium (STOOL SOFTENER) 100 MG capsule Take 300 mg by mouth 2 (two) times daily.    Historical Provider, MD  fentaNYL (DURAGESIC) 50 MCG/HR Place 1 patch (50 mcg  total) onto the skin every 3 (three) days. 10/28/13   Ileana Ladd, MD  finasteride (PROSCAR) 5 MG tablet Take 1 tablet (5 mg total) by mouth daily. 10/04/13   Ileana Ladd, MD  furosemide (LASIX) 40 MG tablet Take 1 tablet (40 mg total) by mouth daily. 10/04/13   Ileana Ladd, MD  linagliptin (TRADJENTA) 5 MG TABS tablet Take 1 tablet (5 mg total) by mouth daily. 10/04/13   Ileana Ladd, MD  Magnesium 250 MG TABS Take by mouth daily.      Historical Provider, MD  magnesium oxide (MAG-OX) 400 MG tablet Take 1,600 mg by mouth at bedtime. INCLUDES: Zinc 15mg     Historical Provider, MD  metFORMIN (GLUCOPHAGE-XR) 500 MG 24 hr tablet Take 500 mg by mouth 3 (three) times daily.    Historical Provider, MD  nebivolol (BYSTOLIC) 5 MG tablet Take 1 tablet (5 mg total) by mouth daily. 10/07/13   Tammy Eckard, PHARMD  Omega-3 Fatty Acids (FISH OIL) 1200 MG CAPS Take 1 capsule by mouth 5 (five) times daily.    Historical Provider, MD  omeprazole (PRILOSEC) 20 MG capsule Take 20 mg by mouth every morning.  10/10/11   Historical Provider, MD  oxyCODONE-acetaminophen (PERCOCET) 5-325 MG per tablet Take 1 tablet by mouth every 6 (six) hours as needed for pain. 09/01/13   Gilda Crease, MD  ramipril (ALTACE) 10 MG capsule Take 1 capsule (10 mg total) by mouth daily. 10/04/13   Ernestina Penna, MD  rosuvastatin (CRESTOR) 10 MG tablet Take 5 mg by mouth daily.     Historical Provider, MD  terazosin (HYTRIN) 10 MG capsule Take 10 mg by mouth at bedtime.      Historical Provider, MD  UNABLE TO FIND Take 1 capsule by mouth every morning. Ingredients include: Meso-Zeaxanthin 10mg , Lutein 10mg , Zeaxanthin 2mg     Historical Provider, MD  vitamin B-12 (CYANOCOBALAMIN) 1000 MCG tablet Take 1,000 mcg by mouth daily.    Historical Provider, MD     ROS: As above in the HPI. All other systems are stable or negative.  OBJECTIVE: APPEARANCE:  Patient in no acute distress.The patient appeared well nourished and  normally developed. Acyanotic. Waist: VITAL SIGNS:Pulse 83  Temp(Src) 97.1 F (36.2 C) (Oral)  Ht 5\' 8"  (1.727 m)  Wt 219 lb 6.4 oz (99.519 kg)  BMI 33.37 kg/m2 WM Obese 11lb less  SKIN: warm and  Dry without overt rashes, tattoos and scars  HEAD and Neck: without JVD, Head and scalp: normal Eyes:No scleral icterus. Fundi normal, eye movements normal. Ears: Auricle normal, canal normal, Tympanic membranes normal, insufflation normal. Nose: normal Throat: normal Neck & thyroid: normal  CHEST & LUNGS: Chest wall: normal Lungs: Clear  CVS: Reveals the PMI to be normally located. Regular rhythm, First and Second Heart sounds are normal,  absence of murmurs, rubs or gallops. Peripheral vasculature: Radial pulses: normal Dorsal pedis pulses: normal Posterior pulses: normal  ABDOMEN:  Appearance: normal Benign, no organomegaly, no masses, no Abdominal Aortic enlargement. No Guarding , no rebound. No Bruits. Bowel sounds: normal  RECTAL: N/A GU: N/A  EXTREMETIES: nonedematous.  MUSCULOSKELETAL:  Spine: normal Joints: intact  NEUROLOGIC: oriented to time,place and person; nonfocal. Strength is normal Sensory is normal Reflexes are normal Cranial Nerves are normal.  ASSESSMENT: CAD (coronary artery disease)  Diabetes mellitus, type 2  Diabetic neuropathy  Diabetic retinopathy  DVT (deep venous thrombosis), unspecified laterality  Hiatal hernia  HTN (hypertension)  Hyperlipidemia  Kidney disease  Lumbar spondylolysis  Prostate cancer  Macular degeneration of both eyes  Sleep apnea  Thyroid nodule  PLAN:  No orders of the defined types were placed in this encounter.   Meds ordered this encounter  Medications  . citalopram (CELEXA) 20 MG tablet    Sig: Take 20 mg by mouth daily.   Marland Kitchen oxyCODONE-acetaminophen (PERCOCET) 5-325 MG per tablet    Sig: Take 1 tablet by mouth every 8 (eight) hours as needed for pain.    Dispense:  45 tablet     Refill:  0   Medications Discontinued During This Encounter  Medication Reason  . linagliptin (TRADJENTA) 5 MG TABS tablet Completed Course  . Magnesium 250 MG TABS Completed Course  . oxyCODONE-acetaminophen (PERCOCET) 5-325 MG per tablet Reorder  . UNABLE TO FIND Error  discussed with daughter and patient on the risks of opioid rx. Discussed the need for judicious use, the risk for OD/respiratory arrest.  Keep active and exercise. Praised patient on his successful lifestyle changes. Return in about 4 weeks (around 11/29/2013) for Recheck medical problems.  Termaine Roupp P. Modesto Charon, M.D.

## 2013-11-04 ENCOUNTER — Other Ambulatory Visit: Payer: Self-pay

## 2013-11-04 NOTE — Telephone Encounter (Signed)
Dr Nash Dimmer nurse please call me about a RX

## 2013-11-05 MED ORDER — LOSARTAN POTASSIUM 50 MG PO TABS: 50.0000 mg | ORAL_TABLET | Freq: Every day | ORAL | Status: DC

## 2013-11-05 NOTE — Addendum Note (Signed)
Addended by: Pura Spice on: 11/05/2013 08:49 AM   Modules accepted: Orders

## 2013-11-05 NOTE — Telephone Encounter (Signed)
Call patient : Prescription refilled & sent to pharmacy in [facility]. 

## 2013-11-05 NOTE — Telephone Encounter (Signed)
[facility] with pt still having "annoying cough" per Dr Modesto Charon to stop the ramipril and start on Losartan 50mg  . Send to [facility] pharmacy  Pt instructed to have BP check by nurse in 7-10 day-- he verbalized understanding

## 2013-11-08 NOTE — Telephone Encounter (Signed)
Pt aware and rx called

## 2013-11-12 ENCOUNTER — Other Ambulatory Visit: Payer: Self-pay | Admitting: Family Medicine

## 2013-11-15 ENCOUNTER — Ambulatory Visit: Payer: Medicare Other | Admitting: *Deleted

## 2013-11-15 VITALS — BP 115/66 | HR 77

## 2013-11-15 DIAGNOSIS — I10 Essential (primary) hypertension: Secondary | ICD-10-CM

## 2013-11-15 NOTE — Progress Notes (Signed)
Patient here today for a 10 day BP rck per Modesto Charon. Bp today was 115/66 p 77

## 2013-11-22 ENCOUNTER — Other Ambulatory Visit: Payer: Self-pay | Admitting: Family Medicine

## 2013-11-24 ENCOUNTER — Other Ambulatory Visit: Payer: Self-pay | Admitting: Family Medicine

## 2013-11-29 ENCOUNTER — Ambulatory Visit (INDEPENDENT_AMBULATORY_CARE_PROVIDER_SITE_OTHER): Payer: Medicare Other | Admitting: Family Medicine

## 2013-11-29 ENCOUNTER — Encounter: Payer: Self-pay | Admitting: Family Medicine

## 2013-11-29 VITALS — BP 131/71 | HR 82 | Temp 97.9°F | Ht 68.0 in | Wt 216.2 lb

## 2013-11-29 DIAGNOSIS — R251 Tremor, unspecified: Secondary | ICD-10-CM | POA: Insufficient documentation

## 2013-11-29 DIAGNOSIS — C61 Malignant neoplasm of prostate: Secondary | ICD-10-CM

## 2013-11-29 DIAGNOSIS — L97409 Non-pressure chronic ulcer of unspecified heel and midfoot with unspecified severity: Secondary | ICD-10-CM

## 2013-11-29 DIAGNOSIS — I2510 Atherosclerotic heart disease of native coronary artery without angina pectoris: Secondary | ICD-10-CM

## 2013-11-29 DIAGNOSIS — I10 Essential (primary) hypertension: Secondary | ICD-10-CM

## 2013-11-29 DIAGNOSIS — E1142 Type 2 diabetes mellitus with diabetic polyneuropathy: Secondary | ICD-10-CM

## 2013-11-29 DIAGNOSIS — R259 Unspecified abnormal involuntary movements: Secondary | ICD-10-CM

## 2013-11-29 DIAGNOSIS — E1149 Type 2 diabetes mellitus with other diabetic neurological complication: Secondary | ICD-10-CM

## 2013-11-29 DIAGNOSIS — E785 Hyperlipidemia, unspecified: Secondary | ICD-10-CM

## 2013-11-29 DIAGNOSIS — L8991 Pressure ulcer of unspecified site, stage 1: Secondary | ICD-10-CM

## 2013-11-29 DIAGNOSIS — L8990 Pressure ulcer of unspecified site, unspecified stage: Secondary | ICD-10-CM

## 2013-11-29 DIAGNOSIS — E1140 Type 2 diabetes mellitus with diabetic neuropathy, unspecified: Secondary | ICD-10-CM

## 2013-11-29 DIAGNOSIS — E119 Type 2 diabetes mellitus without complications: Secondary | ICD-10-CM

## 2013-11-29 LAB — POCT GLYCOSYLATED HEMOGLOBIN (HGB A1C): Hemoglobin A1C: 5.5

## 2013-11-29 MED ORDER — MUPIROCIN CALCIUM 2 % EX CREA: 1.0000 "application " | TOPICAL_CREAM | Freq: Two times a day (BID) | CUTANEOUS | Status: AC

## 2013-11-29 MED ORDER — DOXYCYCLINE HYCLATE 100 MG PO TABS: 100.0000 mg | ORAL_TABLET | Freq: Two times a day (BID) | ORAL | Status: DC

## 2013-11-29 NOTE — Progress Notes (Signed)
Patient ID: [name], male   DOB: 1930/05/06, 79 y.o.   MRN: [id number] SUBJECTIVE: CC: Chief Complaint  Patient presents with  . Follow-up    cough resolved  wants feet checked. thinks the pain patch causing tremors     HPI: Has a irritated area on the back of the left heel.  Patient is here for follow up of Diabetes Mellitus: Symptoms evaluated: Denies Nocturia ,Denies Urinary Frequency , denies Blurred vision ,deniesDizziness,denies.Dysuria,denies paresthesias, denies extremity pain or ulcers.Marland Kitchendenies chest pain. has had an annual eye exam. do check the feet. Does check CBGs. Average CBG:110-140. Sugars better. Denies episodes of hypoglycemia. Does have an emergency hypoglycemic plan. admits toCompliance with medications. Denies Problems with medications.  Has better mobility with PT. However, he wanted to know if it is necessary, because he is sore stiff and tired after the exercises in PT.   Tremors started after he held his oxycodone, for  A few weeks..  Past Medical History  Diagnosis Date  . DVT of leg (deep venous thrombosis) 1999    RLE   . Hypertension   . Hiatal hernia   . Lumbar spondylosis   . Thyroid nodule   . Goiter     stable x many year - last endo exam 08/2010  . Sleep apnea   . Type 2 diabetes mellitus   . Chronic kidney disease   . Diabetic retinopathy     right eye   . Diabetic neuropathy   . Osteopenia   . CAD (coronary artery disease)     (Left main normal, LAD 30-40% mid stenosis, diagonal 30-40% stenosis, circumflex 30-40% stenosis, right coronary artery 30-40% stenosis. 2003.)  . Sleep apnea     CPAP  . GERD (gastroesophageal reflux disease)   . Diabetes mellitus   . Hyperlipidemia   . HTN (hypertension)   . Prostate cancer   . Chronic back pain   . DVT (deep venous thrombosis)    Past Surgical History  Procedure Laterality Date  . Tonsilectomy, adenoidectomy, bilateral myringotomy and tubes    . Inguinal hernia repair     bilateral  . Knee arthroscopy      left   . Biopsy of right ear    . Cholecystectomy    . Cataract extraction, bilateral    . Tonsillectomy and adenoidectomy    . Appendectomy    . Inguinal hernia repair      Bilateral   History   Social History  . Marital Status: Divorced    Spouse Name: N/A    Number of Children: 4  . Years of Education: N/A   Occupational History  . Retired    Social History Main Topics  . Smoking status: Former Smoker    Types: Cigarettes, Pipe, Cigars    Quit date: 11/08/2003  . Smokeless tobacco: Former Neurosurgeon    Quit date: 11/08/2003  . Alcohol Use: No  . Drug Use: No  . Sexual Activity: No   Other Topics Concern  . Not on file   Social History Narrative   ** Merged History Encounter **       Family History  Problem Relation Age of Onset  . Hyperlipidemia Mother   . Hypertension Mother   . Deep vein thrombosis Mother   . Prostate cancer Father   . Colon cancer Sister   . Cancer Father 32  . Pulmonary embolism Mother   . Diabetes Mother    Current Outpatient Prescriptions on File Prior to Visit  Medication Sig Dispense Refill  . amLODipine (NORVASC) 5 MG tablet Take 1 tablet (5 mg total) by mouth daily.  90 tablet  1  . aspirin (ASPIRIN CHILDRENS) 81 MG chewable tablet Chew 81 mg by mouth daily.        . Cholecalciferol (VITAMIN D3) 2000 UNITS capsule Take 2,000 Units by mouth 2 (two) times daily.       . citalopram (CELEXA) 20 MG tablet TAKE 1 TABLET DAILY  30 tablet  2  . docusate sodium (STOOL SOFTENER) 100 MG capsule Take 300 mg by mouth 2 (two) times daily.      . fentaNYL (DURAGESIC) 50 MCG/HR Place 1 patch (50 mcg total) onto the skin every 3 (three) days.  10 patch  0  . finasteride (PROSCAR) 5 MG tablet Take 1 tablet (5 mg total) by mouth daily.  90 tablet  1  . furosemide (LASIX) 40 MG tablet Take 1 tablet (40 mg total) by mouth daily.  90 tablet  1  . losartan (COZAAR) 50 MG tablet Take 1 tablet (50 mg total) by mouth daily.   30 tablet  1  . magnesium oxide (MAG-OX) 400 MG tablet Take 1,600 mg by mouth at bedtime. INCLUDES: Zinc 15mg       . metFORMIN (GLUCOPHAGE-XR) 500 MG 24 hr tablet Take 500 mg by mouth 2 (two) times daily.       . nebivolol (BYSTOLIC) 5 MG tablet Take 1 tablet (5 mg total) by mouth daily.  30 tablet  2  . Omega-3 Fatty Acids (FISH OIL) 1200 MG CAPS Take 1 capsule by mouth 5 (five) times daily.      Marland Kitchen omeprazole (PRILOSEC) 20 MG capsule TAKE  (1)  CAPSULE  TWICE DAILY.  60 capsule  5  . oxyCODONE-acetaminophen (PERCOCET) 5-325 MG per tablet Take 1 tablet by mouth every 8 (eight) hours as needed for pain.  45 tablet  0  . rosuvastatin (CRESTOR) 10 MG tablet Take 5 mg by mouth daily.       Marland Kitchen terazosin (HYTRIN) 5 MG capsule TAKE 1 CAPSULE AT BEDTIME  30 capsule  4  . vitamin B-12 (CYANOCOBALAMIN) 1000 MCG tablet Take 1,000 mcg by mouth daily.      Marland Kitchen acetaminophen (TYLENOL) 500 MG tablet Take 1,500 mg by mouth at bedtime as needed. For pain      . terazosin (HYTRIN) 10 MG capsule Take 10 mg by mouth at bedtime.         No current facility-administered medications on file prior to visit.   Allergies  Allergen Reactions  . Actos [Pioglitazone]     Allergic reaction  . Bactrim   . Celebrex [Celecoxib]   . Flagyl [Metronidazole Hcl]   . Gabapentin   . Lyrica [Pregabalin]   . Lyrica [Pregabalin]     Increase appetite   . Morphine And Related   . Motrin [Ibuprofen]   . Nsaids   . Nsaids     Swelling    . Ultram [Tramadol Hcl]   . Ultram [Tramadol Hcl]     Itching / rash     . Vioxx [Rofecoxib]     Swelling    . Celebrex [Celecoxib] Rash   Immunization History  Administered Date(s) Administered  . Influenza Whole 10/30/2009  . Influenza-Unspecified 09/29/2013  . Pneumococcal Polysaccharide-23 02/28/1999   Prior to Admission medications   Medication Sig Start Date End Date Taking? Authorizing Provider  amLODipine (NORVASC) 5 MG tablet Take 1 tablet (5 mg total) by  mouth daily.  10/04/13  Yes Ileana Ladd, MD  aspirin (ASPIRIN CHILDRENS) 81 MG chewable tablet Chew 81 mg by mouth daily.     Yes Historical Provider, MD  Cholecalciferol (VITAMIN D3) 2000 UNITS capsule Take 2,000 Units by mouth 2 (two) times daily.    Yes Historical Provider, MD  citalopram (CELEXA) 20 MG tablet TAKE 1 TABLET DAILY 11/24/13  Yes Ileana Ladd, MD  docusate sodium (STOOL SOFTENER) 100 MG capsule Take 300 mg by mouth 2 (two) times daily.   Yes Historical Provider, MD  fentaNYL (DURAGESIC) 50 MCG/HR Place 1 patch (50 mcg total) onto the skin every 3 (three) days. 10/28/13  Yes Ileana Ladd, MD  finasteride (PROSCAR) 5 MG tablet Take 1 tablet (5 mg total) by mouth daily. 10/04/13  Yes Ileana Ladd, MD  furosemide (LASIX) 40 MG tablet Take 1 tablet (40 mg total) by mouth daily. 10/04/13  Yes Ileana Ladd, MD  losartan (COZAAR) 50 MG tablet Take 1 tablet (50 mg total) by mouth daily. 11/05/13  Yes Ileana Ladd, MD  magnesium oxide (MAG-OX) 400 MG tablet Take 1,600 mg by mouth at bedtime. INCLUDES: Zinc 15mg    Yes Historical Provider, MD  metFORMIN (GLUCOPHAGE-XR) 500 MG 24 hr tablet Take 500 mg by mouth 3 (three) times daily.   Yes Historical Provider, MD  nebivolol (BYSTOLIC) 5 MG tablet Take 1 tablet (5 mg total) by mouth daily. 10/07/13  Yes Tammy Eckard, PHARMD  Omega-3 Fatty Acids (FISH OIL) 1200 MG CAPS Take 1 capsule by mouth 5 (five) times daily.   Yes Historical Provider, MD  omeprazole (PRILOSEC) 20 MG capsule TAKE  (1)  CAPSULE  TWICE DAILY. 11/12/13  Yes Ileana Ladd, MD  oxyCODONE-acetaminophen (PERCOCET) 5-325 MG per tablet Take 1 tablet by mouth every 8 (eight) hours as needed for pain. 11/01/13  Yes Ileana Ladd, MD  rosuvastatin (CRESTOR) 10 MG tablet Take 5 mg by mouth daily.    Yes Historical Provider, MD  terazosin (HYTRIN) 5 MG capsule TAKE 1 CAPSULE AT BEDTIME 11/22/13  Yes Ileana Ladd, MD  vitamin B-12 (CYANOCOBALAMIN) 1000 MCG tablet Take 1,000 mcg by mouth daily.    Yes Historical Provider, MD  acetaminophen (TYLENOL) 500 MG tablet Take 1,500 mg by mouth at bedtime as needed. For pain    Historical Provider, MD  terazosin (HYTRIN) 10 MG capsule Take 10 mg by mouth at bedtime.      Historical Provider, MD  TRADJENTA 5 MG TABS tablet  09/21/13   Historical Provider, MD     ROS: As above in the HPI. All other systems are stable or negative.  OBJECTIVE: APPEARANCE:  Patient in no acute distress.The patient appeared well nourished and normally developed. Acyanotic. Waist: VITAL SIGNS:BP 131/71  Pulse 82  Temp(Src) 97.9 F (36.6 C) (Oral)  Ht 5\' 8"  (1.727 m)  Wt 216 lb 3.2 oz (98.068 kg)  BMI 32.88 kg/m2 WM obese, more ambulatory today with a cane.  SKIN: warm and  Dry without overt , tattoos and scars There are sever 2 mm blisters on the heel with redness and  HEAD and Neck: without JVD, Head and scalp: normal Eyes:No scleral icterus. Fundi normal, eye movements normal. Ears: Auricle normal, canal normal, Tympanic membranes normal, insufflation normal. Nose: normal Throat: normal Neck & thyroid: normal  CHEST & LUNGS: Chest wall: normal Lungs: Clear  CVS: Reveals the PMI to be normally located. Regular rhythm, First and Second Heart sounds are normal,  absence of murmurs, rubs or gallops. Peripheral vasculature: Radial pulses: normal  ABDOMEN:  Appearance: Obese Benign, no organomegaly, no masses, no Abdominal Aortic enlargement. No Guarding , no rebound. No Bruits. Bowel sounds: normal  RECTAL: N/A GU: N/A  EXTREMETIES: nonedematous.  MUSCULOSKELETAL:  Spine: reduced ROM and chronic lower back discomfort Ambulates with a cane.   NEUROLOGIC: oriented to time,place and person; .Cranial Nerves are normal. Tremors of hands. Intention. Not at rest.no cogwheeling. No signs of Parkinsons.   Results for orders placed in visit on 11/29/13  POCT GLYCOSYLATED HEMOGLOBIN (HGB A1C)      Result Value Range   Hemoglobin A1C 5.5  %      ASSESSMENT:  Decubitus ulcers, stage I - left heel - Plan: mupirocin cream (BACTROBAN) 2 %, doxycycline (VIBRA-TABS) 100 MG tablet  CAD (coronary artery disease)  Diabetes mellitus, type 2 - Plan: POCT glycosylated hemoglobin (Hb A1C), CMP14+EGFR  Diabetic neuropathy  HTN (hypertension) - Plan: CMP14+EGFR  Hyperlipidemia  Prostate cancer  Heel ulcer, left - small abrasions of left heel. - Plan: mupirocin cream (BACTROBAN) 2 %, doxycycline (VIBRA-TABS) 100 MG tablet  Tremors of nervous system  PLAN:  Orders Placed This Encounter  Procedures  . CMP14+EGFR  . POCT glycosylated hemoglobin (Hb A1C)   Meds ordered this encounter  Medications  . TRADJENTA 5 MG TABS tablet    Sig:   . mupirocin cream (BACTROBAN) 2 %    Sig: Apply 1 application topically 2 (two) times daily. Apply to heel    Dispense:  15 g    Refill:  0  . doxycycline (VIBRA-TABS) 100 MG tablet    Sig: Take 1 tablet (100 mg total) by mouth 2 (two) times daily.    Dispense:  20 tablet    Refill:  0  observe tremors for now. Of concern is the blisters of the left heel. Considering he is a DM, this could get worse. Will treat aggressively with antibiotics and sheep skin heel pads. Take pressure off the heel. Wear sandals.wound care discussed with patient and daughter.   There are no discontinued medications. Return in about 4 days (around 12/03/2013) for Recheck medical problems. of the heel.  Salvatore Shear P. [place] Charon, M.D.

## 2013-11-30 LAB — CMP14+EGFR
ALT: 17 IU/L (ref 0–44)
AST: 19 IU/L (ref 0–40)
Albumin/Globulin Ratio: 2.5 (ref 1.1–2.5)
Albumin: 4.2 g/dL (ref 3.5–4.7)
Alkaline Phosphatase: 50 IU/L (ref 39–117)
BUN/Creatinine Ratio: 16 (ref 10–22)
BUN: 22 mg/dL (ref 8–27)
CO2: 27 mmol/L (ref 18–29)
Calcium: 9.4 mg/dL (ref 8.6–10.2)
Chloride: 101 mmol/L (ref 97–108)
Creatinine, Ser: 1.4 mg/dL — ABNORMAL HIGH (ref 0.76–1.27)
GFR calc Af Amer: 54 mL/min/{1.73_m2} — ABNORMAL LOW (ref >59)
GFR calc non Af Amer: 47 mL/min/{1.73_m2} — ABNORMAL LOW (ref >59)
Globulin, Total: 1.7 g/dL (ref 1.5–4.5)
Glucose: 94 mg/dL (ref 65–99)
Potassium: 4.9 mmol/L (ref 3.5–5.2)
Sodium: 142 mmol/L (ref 134–144)
Total Bilirubin: 0.4 mg/dL (ref 0.0–1.2)
Total Protein: 5.9 g/dL — ABNORMAL LOW (ref 6.0–8.5)

## 2013-12-03 ENCOUNTER — Other Ambulatory Visit: Payer: Self-pay | Admitting: Family Medicine

## 2013-12-03 ENCOUNTER — Ambulatory Visit (INDEPENDENT_AMBULATORY_CARE_PROVIDER_SITE_OTHER): Payer: Medicare Other | Admitting: Family Medicine

## 2013-12-03 ENCOUNTER — Encounter: Payer: Self-pay | Admitting: Family Medicine

## 2013-12-03 VITALS — BP 96/59 | HR 74 | Temp 98.4°F | Ht 68.0 in | Wt 216.4 lb

## 2013-12-03 DIAGNOSIS — E119 Type 2 diabetes mellitus without complications: Secondary | ICD-10-CM

## 2013-12-03 DIAGNOSIS — T148XXA Other injury of unspecified body region, initial encounter: Secondary | ICD-10-CM | POA: Insufficient documentation

## 2013-12-03 DIAGNOSIS — L8990 Pressure ulcer of unspecified site, unspecified stage: Secondary | ICD-10-CM

## 2013-12-03 DIAGNOSIS — L8991 Pressure ulcer of unspecified site, stage 1: Secondary | ICD-10-CM

## 2013-12-03 MED ORDER — FENTANYL 50 MCG/HR TD PT72: 50.0000 ug | MEDICATED_PATCH | TRANSDERMAL | Status: DC

## 2013-12-03 NOTE — Progress Notes (Signed)
Quick Note:  Call Patient Labs abnormal:hgba1c is low the creatinine is elevated  Recommendations: Hold the metformin.. Stay on the tradjenta. Keep the follow up   ______

## 2013-12-03 NOTE — Patient Instructions (Signed)
Continue skin care Stay on antibiotics.  RTc next week

## 2013-12-03 NOTE — Progress Notes (Signed)
Patient ID: [name], male   DOB: February 01, 1930, 79 y.o.   MRN: [id number] SUBJECTIVE: CC: Chief Complaint  Patient presents with  . Follow-up    reck foot  left heel    HPI: Doing better Came for  Recheck Needs fentanyl refilled Past Medical History  Diagnosis Date  . DVT of leg (deep venous thrombosis) 1999    RLE   . Hypertension   . Hiatal hernia   . Lumbar spondylosis   . Thyroid nodule   . Goiter     stable x many year - last endo exam 08/2010  . Sleep apnea   . Type 2 diabetes mellitus   . Chronic kidney disease   . Diabetic retinopathy     right eye   . Diabetic neuropathy   . Osteopenia   . CAD (coronary artery disease)     (Left main normal, LAD 30-40% mid stenosis, diagonal 30-40% stenosis, circumflex 30-40% stenosis, right coronary artery 30-40% stenosis. 2003.)  . Sleep apnea     CPAP  . GERD (gastroesophageal reflux disease)   . Diabetes mellitus   . Hyperlipidemia   . HTN (hypertension)   . Prostate cancer   . Chronic back pain   . DVT (deep venous thrombosis)    Past Surgical History  Procedure Laterality Date  . Tonsilectomy, adenoidectomy, bilateral myringotomy and tubes    . Inguinal hernia repair      bilateral  . Knee arthroscopy      left   . Biopsy of right ear    . Cholecystectomy    . Cataract extraction, bilateral    . Tonsillectomy and adenoidectomy    . Appendectomy    . Inguinal hernia repair      Bilateral   History   Social History  . Marital Status: Divorced    Spouse Name: N/A    Number of Children: 4  . Years of Education: N/A   Occupational History  . Retired    Social History Main Topics  . Smoking status: Former Smoker    Types: Cigarettes, Pipe, Cigars    Quit date: 11/08/2003  . Smokeless tobacco: Former Neurosurgeon    Quit date: 11/08/2003  . Alcohol Use: No  . Drug Use: No  . Sexual Activity: No   Other Topics Concern  . Not on file   Social History Narrative   ** Merged History Encounter **        Family History  Problem Relation Age of Onset  . Hyperlipidemia Mother   . Hypertension Mother   . Deep vein thrombosis Mother   . Prostate cancer Father   . Colon cancer Sister   . Cancer Father 68  . Pulmonary embolism Mother   . Diabetes Mother    Current Outpatient Prescriptions on File Prior to Visit  Medication Sig Dispense Refill  . acetaminophen (TYLENOL) 500 MG tablet Take 1,500 mg by mouth at bedtime as needed. For pain      . amLODipine (NORVASC) 5 MG tablet Take 1 tablet (5 mg total) by mouth daily.  90 tablet  1  . aspirin (ASPIRIN CHILDRENS) 81 MG chewable tablet Chew 81 mg by mouth daily.        . Cholecalciferol (VITAMIN D3) 2000 UNITS capsule Take 2,000 Units by mouth 2 (two) times daily.       . citalopram (CELEXA) 20 MG tablet TAKE 1 TABLET DAILY  30 tablet  2  . docusate sodium (STOOL SOFTENER) 100 MG  capsule Take 300 mg by mouth 2 (two) times daily.      Marland Kitchen doxycycline (VIBRA-TABS) 100 MG tablet Take 1 tablet (100 mg total) by mouth 2 (two) times daily.  20 tablet  0  . finasteride (PROSCAR) 5 MG tablet Take 1 tablet (5 mg total) by mouth daily.  90 tablet  1  . furosemide (LASIX) 40 MG tablet Take 1 tablet (40 mg total) by mouth daily.  90 tablet  1  . losartan (COZAAR) 50 MG tablet Take 1 tablet (50 mg total) by mouth daily.  30 tablet  1  . magnesium oxide (MAG-OX) 400 MG tablet Take 1,600 mg by mouth at bedtime. INCLUDES: Zinc 15mg       . metFORMIN (GLUCOPHAGE-XR) 500 MG 24 hr tablet Take 500 mg by mouth 2 (two) times daily.       . mupirocin cream (BACTROBAN) 2 % Apply 1 application topically 2 (two) times daily. Apply to heel  15 g  0  . nebivolol (BYSTOLIC) 5 MG tablet Take 1 tablet (5 mg total) by mouth daily.  30 tablet  2  . Omega-3 Fatty Acids (FISH OIL) 1200 MG CAPS Take 1 capsule by mouth 5 (five) times daily.      Marland Kitchen omeprazole (PRILOSEC) 20 MG capsule TAKE  (1)  CAPSULE  TWICE DAILY.  60 capsule  5  . oxyCODONE-acetaminophen (PERCOCET) 5-325 MG per  tablet Take 1 tablet by mouth every 8 (eight) hours as needed for pain.  45 tablet  0  . rosuvastatin (CRESTOR) 10 MG tablet Take 5 mg by mouth daily.       Marland Kitchen terazosin (HYTRIN) 10 MG capsule Take 10 mg by mouth at bedtime.        Marland Kitchen terazosin (HYTRIN) 5 MG capsule TAKE 1 CAPSULE AT BEDTIME  30 capsule  4  . TRADJENTA 5 MG TABS tablet       . vitamin B-12 (CYANOCOBALAMIN) 1000 MCG tablet Take 1,000 mcg by mouth daily.       No current facility-administered medications on file prior to visit.   Allergies  Allergen Reactions  . Actos [Pioglitazone]     Allergic reaction  . Bactrim   . Celebrex [Celecoxib]   . Flagyl [Metronidazole Hcl]   . Gabapentin   . Lyrica [Pregabalin]   . Lyrica [Pregabalin]     Increase appetite   . Morphine And Related   . Motrin [Ibuprofen]   . Nsaids   . Nsaids     Swelling    . Ultram [Tramadol Hcl]   . Ultram [Tramadol Hcl]     Itching / rash     . Vioxx [Rofecoxib]     Swelling    . Celebrex [Celecoxib] Rash   Immunization History  Administered Date(s) Administered  . Influenza Whole 10/30/2009  . Influenza-Unspecified 09/29/2013  . Pneumococcal Polysaccharide-23 02/28/1999   Prior to Admission medications   Medication Sig Start Date End Date Taking? Authorizing Provider  acetaminophen (TYLENOL) 500 MG tablet Take 1,500 mg by mouth at bedtime as needed. For pain    Historical Provider, MD  amLODipine (NORVASC) 5 MG tablet Take 1 tablet (5 mg total) by mouth daily. 10/04/13   Ileana Ladd, MD  aspirin (ASPIRIN CHILDRENS) 81 MG chewable tablet Chew 81 mg by mouth daily.      Historical Provider, MD  Cholecalciferol (VITAMIN D3) 2000 UNITS capsule Take 2,000 Units by mouth 2 (two) times daily.     Historical Provider, MD  citalopram (  CELEXA) 20 MG tablet TAKE 1 TABLET DAILY 11/24/13   Ileana Ladd, MD  docusate sodium (STOOL SOFTENER) 100 MG capsule Take 300 mg by mouth 2 (two) times daily.    Historical Provider, MD  doxycycline  (VIBRA-TABS) 100 MG tablet Take 1 tablet (100 mg total) by mouth 2 (two) times daily. 11/29/13   Ileana Ladd, MD  fentaNYL (DURAGESIC) 50 MCG/HR Place 1 patch (50 mcg total) onto the skin every 3 (three) days. 12/03/13   Ileana Ladd, MD  finasteride (PROSCAR) 5 MG tablet Take 1 tablet (5 mg total) by mouth daily. 10/04/13   Ileana Ladd, MD  furosemide (LASIX) 40 MG tablet Take 1 tablet (40 mg total) by mouth daily. 10/04/13   Ileana Ladd, MD  losartan (COZAAR) 50 MG tablet Take 1 tablet (50 mg total) by mouth daily. 11/05/13   Ileana Ladd, MD  magnesium oxide (MAG-OX) 400 MG tablet Take 1,600 mg by mouth at bedtime. INCLUDES: Zinc 15mg     Historical Provider, MD  metFORMIN (GLUCOPHAGE-XR) 500 MG 24 hr tablet Take 500 mg by mouth 2 (two) times daily.     Historical Provider, MD  mupirocin cream (BACTROBAN) 2 % Apply 1 application topically 2 (two) times daily. Apply to heel 11/29/13 12/06/13  Ileana Ladd, MD  nebivolol (BYSTOLIC) 5 MG tablet Take 1 tablet (5 mg total) by mouth daily. 10/07/13   Tammy Eckard, PHARMD  Omega-3 Fatty Acids (FISH OIL) 1200 MG CAPS Take 1 capsule by mouth 5 (five) times daily.    Historical Provider, MD  omeprazole (PRILOSEC) 20 MG capsule TAKE  (1)  CAPSULE  TWICE DAILY. 11/12/13   Ileana Ladd, MD  oxyCODONE-acetaminophen (PERCOCET) 5-325 MG per tablet Take 1 tablet by mouth every 8 (eight) hours as needed for pain. 11/01/13   Ileana Ladd, MD  rosuvastatin (CRESTOR) 10 MG tablet Take 5 mg by mouth daily.     Historical Provider, MD  terazosin (HYTRIN) 10 MG capsule Take 10 mg by mouth at bedtime.      Historical Provider, MD  terazosin (HYTRIN) 5 MG capsule TAKE 1 CAPSULE AT BEDTIME 11/22/13   Ileana Ladd, MD  TRADJENTA 5 MG TABS tablet  09/21/13   Historical Provider, MD  vitamin B-12 (CYANOCOBALAMIN) 1000 MCG tablet Take 1,000 mcg by mouth daily.    Historical Provider, MD     ROS: As above in the HPI. All other systems are stable or  negative.  OBJECTIVE: APPEARANCE:  Patient in no acute distress.The patient appeared well nourished and normally developed. Acyanotic. Waist: VITAL SIGNS:BP 96/59  Pulse 74  Temp(Src) 98.4 F (36.9 C) (Oral)  Ht 5\' 8"  (1.727 m)  Wt 216 lb 6.4 oz (98.158 kg)  BMI 32.91 kg/m2  Obese WM.  Results for orders placed in visit on 11/29/13  CMP14+EGFR      Result Value Range   Glucose 94  65 - 99 mg/dL   BUN 22  8 - 27 mg/dL   Creatinine, Ser 0.45 (*) 0.76 - 1.27 mg/dL   GFR calc non Af Amer 47 (*) >59 mL/min/1.73   GFR calc Af Amer 54 (*) >59 mL/min/1.73   BUN/Creatinine Ratio 16  10 - 22   Sodium 142  134 - 144 mmol/L   Potassium 4.9  3.5 - 5.2 mmol/L   Chloride 101  97 - 108 mmol/L   CO2 27  18 - 29 mmol/L   Calcium 9.4  8.6 - 10.2  mg/dL   Total Protein 5.9 (*) 6.0 - 8.5 g/dL   Albumin 4.2  3.5 - 4.7 g/dL   Globulin, Total 1.7  1.5 - 4.5 g/dL   Albumin/Globulin Ratio 2.5  1.1 - 2.5   Total Bilirubin 0.4  0.0 - 1.2 mg/dL   Alkaline Phosphatase 50  39 - 117 IU/L   AST 19  0 - 40 IU/L   ALT 17  0 - 44 IU/L  POCT GLYCOSYLATED HEMOGLOBIN (HGB A1C)      Result Value Range   Hemoglobin A1C 5.5 %      ASSESSMENT: Blister - better - Plan: Aerobic culture  Decubitus ulcers, stage I - Plan: Aerobic culture  Diabetes mellitus - Plan: Aerobic culture  PLAN:  Orders Placed This Encounter  Procedures  . Aerobic culture   Meds ordered this encounter  Medications  . fentaNYL (DURAGESIC) 50 MCG/HR    Sig: Place 1 patch (50 mcg total) onto the skin every 3 (three) days.    Dispense:  10 patch    Refill:  0  . mupirocin ointment (BACTROBAN) 2 %    Sig:    Medications Discontinued During This Encounter  Medication Reason  . fentaNYL (DURAGESIC) 50 MCG/HR Reorder   Return in about 6 days (around 12/09/2013).  Kyshawn Teal P. Erick Blinks.D.Continue skin care Stay on antibiotics.  RTc next week

## 2013-12-05 LAB — AEROBIC CULTURE

## 2013-12-09 ENCOUNTER — Ambulatory Visit (INDEPENDENT_AMBULATORY_CARE_PROVIDER_SITE_OTHER): Payer: Medicare Other | Admitting: Family Medicine

## 2013-12-09 ENCOUNTER — Encounter: Payer: Self-pay | Admitting: Family Medicine

## 2013-12-09 VITALS — BP 98/51 | HR 87 | Temp 97.8°F | Ht 68.0 in | Wt 215.2 lb

## 2013-12-09 DIAGNOSIS — L8990 Pressure ulcer of unspecified site, unspecified stage: Secondary | ICD-10-CM

## 2013-12-09 DIAGNOSIS — T148XXA Other injury of unspecified body region, initial encounter: Secondary | ICD-10-CM

## 2013-12-09 DIAGNOSIS — E119 Type 2 diabetes mellitus without complications: Secondary | ICD-10-CM

## 2013-12-09 DIAGNOSIS — L8991 Pressure ulcer of unspecified site, stage 1: Secondary | ICD-10-CM

## 2013-12-09 NOTE — Progress Notes (Signed)
Patient ID: [name], male   DOB: Jul 01, 1930, 79 y.o.   MRN: [id number] SUBJECTIVE: CC: Chief Complaint  Patient presents with  . Follow-up    reck left foot  pt states has reduced his metformin to one daily and not taking trajendta    HPI: Better now.no drainage. Heel is healing. Got the message to stop the metformin but his sugar is in the low 100s. BPs are normal. 110-120s/70s He is ambulating better with PT. Brought his readings in and they correlate with what he says.  Past Medical History  Diagnosis Date  . DVT of leg (deep venous thrombosis) 1999    RLE   . Hypertension   . Hiatal hernia   . Lumbar spondylosis   . Thyroid nodule   . Goiter     stable x many year - last endo exam 08/2010  . Sleep apnea   . Type 2 diabetes mellitus   . Chronic kidney disease   . Diabetic retinopathy     right eye   . Diabetic neuropathy   . Osteopenia   . CAD (coronary artery disease)     (Left main normal, LAD 30-40% mid stenosis, diagonal 30-40% stenosis, circumflex 30-40% stenosis, right coronary artery 30-40% stenosis. 2003.)  . Sleep apnea     CPAP  . GERD (gastroesophageal reflux disease)   . Diabetes mellitus   . Hyperlipidemia   . HTN (hypertension)   . Prostate cancer   . Chronic back pain   . DVT (deep venous thrombosis)    Past Surgical History  Procedure Laterality Date  . Tonsilectomy, adenoidectomy, bilateral myringotomy and tubes    . Inguinal hernia repair      bilateral  . Knee arthroscopy      left   . Biopsy of right ear    . Cholecystectomy    . Cataract extraction, bilateral    . Tonsillectomy and adenoidectomy    . Appendectomy    . Inguinal hernia repair      Bilateral   History   Social History  . Marital Status: Divorced    Spouse Name: N/A    Number of Children: 4  . Years of Education: N/A   Occupational History  . Retired    Social History Main Topics  . Smoking status: Former Smoker    Types: Cigarettes, Pipe, Cigars     Quit date: 11/08/2003  . Smokeless tobacco: Former Neurosurgeon    Quit date: 11/08/2003  . Alcohol Use: No  . Drug Use: No  . Sexual Activity: No   Other Topics Concern  . Not on file   Social History Narrative   ** Merged History Encounter **       Family History  Problem Relation Age of Onset  . Hyperlipidemia Mother   . Hypertension Mother   . Deep vein thrombosis Mother   . Prostate cancer Father   . Colon cancer Sister   . Cancer Father 91  . Pulmonary embolism Mother   . Diabetes Mother    Current Outpatient Prescriptions on File Prior to Visit  Medication Sig Dispense Refill  . acetaminophen (TYLENOL) 500 MG tablet Take 1,500 mg by mouth at bedtime as needed. For pain      . amLODipine (NORVASC) 5 MG tablet Take 1 tablet (5 mg total) by mouth daily.  90 tablet  1  . aspirin (ASPIRIN CHILDRENS) 81 MG chewable tablet Chew 81 mg by mouth daily.        Marland Kitchen  Cholecalciferol (VITAMIN D3) 2000 UNITS capsule Take 2,000 Units by mouth 2 (two) times daily.       . citalopram (CELEXA) 20 MG tablet TAKE 1 TABLET DAILY  30 tablet  2  . docusate sodium (STOOL SOFTENER) 100 MG capsule Take 300 mg by mouth 2 (two) times daily.      Marland Kitchen doxycycline (VIBRA-TABS) 100 MG tablet Take 1 tablet (100 mg total) by mouth 2 (two) times daily.  20 tablet  0  . fentaNYL (DURAGESIC) 50 MCG/HR Place 1 patch (50 mcg total) onto the skin every 3 (three) days.  10 patch  0  . finasteride (PROSCAR) 5 MG tablet Take 1 tablet (5 mg total) by mouth daily.  90 tablet  1  . finasteride (PROSCAR) 5 MG tablet TAKE 1 TABLET BY MOUTH DAILY  90 tablet  1  . furosemide (LASIX) 40 MG tablet Take 1 tablet (40 mg total) by mouth daily.  90 tablet  1  . losartan (COZAAR) 50 MG tablet Take 1 tablet (50 mg total) by mouth daily.  30 tablet  1  . magnesium oxide (MAG-OX) 400 MG tablet Take 1,600 mg by mouth at bedtime. INCLUDES: Zinc 15mg       . metFORMIN (GLUCOPHAGE-XR) 500 MG 24 hr tablet Take 500 mg by mouth 2 (two) times daily.        . mupirocin ointment (BACTROBAN) 2 %       . nebivolol (BYSTOLIC) 5 MG tablet Take 1 tablet (5 mg total) by mouth daily.  30 tablet  2  . Omega-3 Fatty Acids (FISH OIL) 1200 MG CAPS Take 1 capsule by mouth 5 (five) times daily.      Marland Kitchen omeprazole (PRILOSEC) 20 MG capsule TAKE  (1)  CAPSULE  TWICE DAILY.  60 capsule  5  . oxyCODONE-acetaminophen (PERCOCET) 5-325 MG per tablet Take 1 tablet by mouth every 8 (eight) hours as needed for pain.  45 tablet  0  . rosuvastatin (CRESTOR) 10 MG tablet Take 5 mg by mouth daily.       Marland Kitchen terazosin (HYTRIN) 10 MG capsule Take 10 mg by mouth at bedtime.        Marland Kitchen terazosin (HYTRIN) 5 MG capsule TAKE 1 CAPSULE AT BEDTIME  30 capsule  4  . vitamin B-12 (CYANOCOBALAMIN) 1000 MCG tablet Take 1,000 mcg by mouth daily.       No current facility-administered medications on file prior to visit.   Allergies  Allergen Reactions  . Actos [Pioglitazone]     Allergic reaction  . Bactrim   . Celebrex [Celecoxib]   . Flagyl [Metronidazole Hcl]   . Gabapentin   . Lyrica [Pregabalin]   . Lyrica [Pregabalin]     Increase appetite   . Morphine And Related   . Motrin [Ibuprofen]   . Nsaids   . Nsaids     Swelling    . Ultram [Tramadol Hcl]   . Ultram [Tramadol Hcl]     Itching / rash     . Vioxx [Rofecoxib]     Swelling    . Celebrex [Celecoxib] Rash   Immunization History  Administered Date(s) Administered  . Influenza Whole 10/30/2009  . Influenza-Unspecified 09/29/2013  . Pneumococcal Polysaccharide-23 02/28/1999   Prior to Admission medications   Medication Sig Start Date End Date Taking? Authorizing Provider  acetaminophen (TYLENOL) 500 MG tablet Take 1,500 mg by mouth at bedtime as needed. For pain    Historical Provider, MD  amLODipine (NORVASC) 5 MG tablet  Take 1 tablet (5 mg total) by mouth daily. 10/04/13   Ileana Ladd, MD  aspirin (ASPIRIN CHILDRENS) 81 MG chewable tablet Chew 81 mg by mouth daily.      Historical Provider, MD   Cholecalciferol (VITAMIN D3) 2000 UNITS capsule Take 2,000 Units by mouth 2 (two) times daily.     Historical Provider, MD  citalopram (CELEXA) 20 MG tablet TAKE 1 TABLET DAILY 11/24/13   Ileana Ladd, MD  docusate sodium (STOOL SOFTENER) 100 MG capsule Take 300 mg by mouth 2 (two) times daily.    Historical Provider, MD  doxycycline (VIBRA-TABS) 100 MG tablet Take 1 tablet (100 mg total) by mouth 2 (two) times daily. 11/29/13   Ileana Ladd, MD  fentaNYL (DURAGESIC) 50 MCG/HR Place 1 patch (50 mcg total) onto the skin every 3 (three) days. 12/03/13   Ileana Ladd, MD  finasteride (PROSCAR) 5 MG tablet Take 1 tablet (5 mg total) by mouth daily. 10/04/13   Ileana Ladd, MD  finasteride (PROSCAR) 5 MG tablet TAKE 1 TABLET BY MOUTH DAILY 12/03/13   Ileana Ladd, MD  furosemide (LASIX) 40 MG tablet Take 1 tablet (40 mg total) by mouth daily. 10/04/13   Ileana Ladd, MD  losartan (COZAAR) 50 MG tablet Take 1 tablet (50 mg total) by mouth daily. 11/05/13   Ileana Ladd, MD  magnesium oxide (MAG-OX) 400 MG tablet Take 1,600 mg by mouth at bedtime. INCLUDES: Zinc 15mg     Historical Provider, MD  metFORMIN (GLUCOPHAGE-XR) 500 MG 24 hr tablet Take 500 mg by mouth 2 (two) times daily.     Historical Provider, MD  mupirocin ointment (BACTROBAN) 2 %  11/29/13   Historical Provider, MD  nebivolol (BYSTOLIC) 5 MG tablet Take 1 tablet (5 mg total) by mouth daily. 10/07/13   Tammy Eckard, PHARMD  Omega-3 Fatty Acids (FISH OIL) 1200 MG CAPS Take 1 capsule by mouth 5 (five) times daily.    Historical Provider, MD  omeprazole (PRILOSEC) 20 MG capsule TAKE  (1)  CAPSULE  TWICE DAILY. 11/12/13   Ileana Ladd, MD  oxyCODONE-acetaminophen (PERCOCET) 5-325 MG per tablet Take 1 tablet by mouth every 8 (eight) hours as needed for pain. 11/01/13   Ileana Ladd, MD  rosuvastatin (CRESTOR) 10 MG tablet Take 5 mg by mouth daily.     Historical Provider, MD  terazosin (HYTRIN) 10 MG capsule Take 10 mg by mouth at  bedtime.      Historical Provider, MD  terazosin (HYTRIN) 5 MG capsule TAKE 1 CAPSULE AT BEDTIME 11/22/13   Ileana Ladd, MD  TRADJENTA 5 MG TABS tablet  09/21/13   Historical Provider, MD  vitamin B-12 (CYANOCOBALAMIN) 1000 MCG tablet Take 1,000 mcg by mouth daily.    Historical Provider, MD     ROS: As above in the HPI. All other systems are stable or negative.  OBJECTIVE: APPEARANCE:  Patient in no acute distress.The patient appeared well nourished and normally developed. Acyanotic. Waist: VITAL SIGNS:BP 98/51  Pulse 87  Temp(Src) 97.8 F (36.6 C) (Oral)  Ht 5\' 8"  (1.727 m)  Wt 215 lb 3.2 oz (97.614 kg)  BMI 32.73 kg/m2  WM Ambulates without a cane today. SKIN: warm and  Dry without  tattoos and scars. The blisters on his left heel is clean dried and no signs of infection  HEAD and Neck: without JVD, Head and scalp: normal Eyes:No scleral icterus. Fundi normal, eye movements normal. Ears: Auricle normal, canal  normal, Tympanic membranes normal, insufflation normal. Nose: normal Throat: normal Neck & thyroid: normal  CHEST & LUNGS: Chest wall: normal Lungs: Clear  CVS: Reveals the PMI to be normally located. Regular rhythm, First and Second Heart sounds are normal,  absence of murmurs, rubs or gallops. Peripheral vasculature: Radial pulses: normal  ABDOMEN:  Appearance: obese Benign, no organomegaly, no masses, no Abdominal Aortic enlargement. No Guarding , no rebound. No Bruits. Bowel sounds: normal  RECTAL: N/A GU: N/A  EXTREMETIES: nonedematous.  Results for orders placed in visit on 12/03/13  AEROBIC CULTURE      Result Value Range   Aerobic Bacterial Culture Final report     Result 1 Comment      ASSESSMENT: Blister - better see photo in media  Decubitus ulcers, stage I  Diabetes mellitus  PLAN: Continue the same  Wound care RTC stat any relapse. It is healing so well, that no further follow up necessary at this point. Reviewed his  labs with him. However he has not taken tradjenta in months and has been on metformin twice daily. Therefore will reduce it to once daily due to the renal insufficency and the low HGBa1C.  No orders of the defined types were placed in this encounter.   No orders of the defined types were placed in this encounter.   Medications Discontinued During This Encounter  Medication Reason  . TRADJENTA 5 MG TABS tablet Patient has not taken in last 30 days   Return in about 1 month (around 01/09/2014) for Recheck medical problems.  Wilsie Kern P. Modesto Charon, M.D.

## 2013-12-13 ENCOUNTER — Ambulatory Visit (INDEPENDENT_AMBULATORY_CARE_PROVIDER_SITE_OTHER): Payer: Medicare Other | Admitting: Nurse Practitioner

## 2013-12-13 VITALS — BP 144/75 | HR 94 | Temp 97.0°F | Ht 68.0 in | Wt 215.0 lb

## 2013-12-13 DIAGNOSIS — L02619 Cutaneous abscess of unspecified foot: Secondary | ICD-10-CM

## 2013-12-13 DIAGNOSIS — L03116 Cellulitis of left lower limb: Secondary | ICD-10-CM

## 2013-12-13 MED ORDER — CEFTRIAXONE SODIUM 1 G IJ SOLR
500.0000 mg | Freq: Once | INTRAMUSCULAR | Status: AC
  Administered 2013-12-13: 500 mg via INTRAMUSCULAR

## 2013-12-13 MED ORDER — MUPIROCIN 2 % EX OINT: TOPICAL_OINTMENT | Freq: Two times a day (BID) | CUTANEOUS | Status: DC

## 2013-12-13 NOTE — Progress Notes (Signed)
   Subjective:    Patient ID: [name], male    DOB: Feb 12, 1930, 79 y.o.   MRN: [id number]  HPI Patient here today for follow-up of diabetic ulcer on left foot. Saw Dr. Modesto Charon last week and was on antibiotic and ointment for ulcer- Now has another lesion on left foot that is draining wanted it looked at because of his diabetes.    Review of Systems  All other systems reviewed and are negative.       Objective:   Physical Exam  Constitutional: He appears well-developed and well-nourished.  Cardiovascular: Normal rate, regular rhythm and normal heart sounds.   Pulmonary/Chest: Effort normal and breath sounds normal.  Skin: Skin is warm and dry.  1 cm annular lesion on lateral side of left foot. Heel lesion healing with slight erythema   BP 144/75  Pulse 94  Temp(Src) 97 F (36.1 C) (Oral)  Ht 5\' 8"  (1.727 m)  Wt 215 lb (97.523 kg)  BMI 32.70 kg/m2        Assessment & Plan:   1. Cellulitis of left foot    Meds ordered this encounter  Medications  . cefTRIAXone (ROCEPHIN) injection 500 mg    Sig:    Continue muprion topically Keep dry Follow up with dr. Modesto Charon in 1 week  Mary-Margaret Daphine Deutscher, FNP

## 2013-12-13 NOTE — Patient Instructions (Signed)
Wound Care Wound care helps prevent pain and infection.  You may need a tetanus shot if:  You cannot remember when you had your last tetanus shot.  You have never had a tetanus shot.  The injury broke your skin. If you need a tetanus shot and you choose not to have one, you may get tetanus. Sickness from tetanus can be serious. HOME CARE   Only take medicine as told by your doctor.  Clean the wound daily with mild soap and water.  Change any bandages (dressings) as told by your doctor.  Put medicated cream and a bandage on the wound as told by your doctor.  Change the bandage if it gets wet, dirty, or starts to smell.  Take showers. Do not take baths, swim, or do anything that puts your wound under water.  Rest and raise (elevate) the wound until the pain and puffiness (swelling) are better.  Keep all doctor visits as told. GET HELP RIGHT AWAY IF:   Yellowish-white fluid (pus) comes from the wound.  Medicine does not lessen your pain.  There is a red streak going away from the wound.  You have a fever. MAKE SURE YOU:   Understand these instructions.  Will watch your condition.  Will get help right away if you are not doing well or get worse. Document Released: 09/24/2008 Document Revised: 03/09/2012 Document Reviewed: 04/21/2011 [employer] Patient Information 2014 [employer].  

## 2013-12-20 ENCOUNTER — Encounter: Payer: Self-pay | Admitting: Family Medicine

## 2013-12-20 ENCOUNTER — Ambulatory Visit (INDEPENDENT_AMBULATORY_CARE_PROVIDER_SITE_OTHER): Payer: Medicare Other | Admitting: Family Medicine

## 2013-12-20 VITALS — BP 88/56 | HR 78 | Temp 97.0°F | Ht 68.0 in | Wt 218.0 lb

## 2013-12-20 DIAGNOSIS — L8990 Pressure ulcer of unspecified site, unspecified stage: Secondary | ICD-10-CM

## 2013-12-20 DIAGNOSIS — T148XXA Other injury of unspecified body region, initial encounter: Secondary | ICD-10-CM

## 2013-12-20 DIAGNOSIS — E1142 Type 2 diabetes mellitus with diabetic polyneuropathy: Secondary | ICD-10-CM

## 2013-12-20 DIAGNOSIS — E1140 Type 2 diabetes mellitus with diabetic neuropathy, unspecified: Secondary | ICD-10-CM

## 2013-12-20 DIAGNOSIS — I2510 Atherosclerotic heart disease of native coronary artery without angina pectoris: Secondary | ICD-10-CM

## 2013-12-20 DIAGNOSIS — E119 Type 2 diabetes mellitus without complications: Secondary | ICD-10-CM

## 2013-12-20 DIAGNOSIS — L8991 Pressure ulcer of unspecified site, stage 1: Secondary | ICD-10-CM

## 2013-12-20 DIAGNOSIS — L97409 Non-pressure chronic ulcer of unspecified heel and midfoot with unspecified severity: Secondary | ICD-10-CM

## 2013-12-20 DIAGNOSIS — E785 Hyperlipidemia, unspecified: Secondary | ICD-10-CM

## 2013-12-20 DIAGNOSIS — N289 Disorder of kidney and ureter, unspecified: Secondary | ICD-10-CM

## 2013-12-20 DIAGNOSIS — E11319 Type 2 diabetes mellitus with unspecified diabetic retinopathy without macular edema: Secondary | ICD-10-CM

## 2013-12-20 DIAGNOSIS — I10 Essential (primary) hypertension: Secondary | ICD-10-CM

## 2013-12-20 MED ORDER — DOXYCYCLINE HYCLATE 100 MG PO TABS
100.0000 mg | ORAL_TABLET | Freq: Two times a day (BID) | ORAL | Status: DC
Start: 2013-12-20 — End: 2014-03-18

## 2013-12-20 NOTE — Progress Notes (Signed)
Patient ID: [name], male   DOB: 09-Apr-1930, 79 y.o.   MRN: [id number] SUBJECTIVE: CC: Chief Complaint  Patient presents with  . Cellulitis    follow-up on cellultis to left foot    HPI: Has had a culture negative pustular eruption around  The heel for several weeks now. Also has had a stage 1 pressure/decubitus ulcer of the heel.with cellulitis which resolved and then relapsed and required IM rocephin recently. He is here for follow up again.   Past Medical History  Diagnosis Date  . DVT of leg (deep venous thrombosis) 1999    RLE   . Hypertension   . Hiatal hernia   . Lumbar spondylosis   . Thyroid nodule   . Goiter     stable x many year - last endo exam 08/2010  . Sleep apnea   . Type 2 diabetes mellitus   . Chronic kidney disease   . Diabetic retinopathy     right eye   . Diabetic neuropathy   . Osteopenia   . CAD (coronary artery disease)     (Left main normal, LAD 30-40% mid stenosis, diagonal 30-40% stenosis, circumflex 30-40% stenosis, right coronary artery 30-40% stenosis. 2003.)  . Sleep apnea     CPAP  . GERD (gastroesophageal reflux disease)   . Diabetes mellitus   . Hyperlipidemia   . HTN (hypertension)   . Prostate cancer   . Chronic back pain   . DVT (deep venous thrombosis)    Past Surgical History  Procedure Laterality Date  . Tonsilectomy, adenoidectomy, bilateral myringotomy and tubes    . Inguinal hernia repair      bilateral  . Knee arthroscopy      left   . Biopsy of right ear    . Cholecystectomy    . Cataract extraction, bilateral    . Tonsillectomy and adenoidectomy    . Appendectomy    . Inguinal hernia repair      Bilateral   History   Social History  . Marital Status: Divorced    Spouse Name: N/A    Number of Children: 4  . Years of Education: N/A   Occupational History  . Retired    Social History Main Topics  . Smoking status: Former Smoker    Types: Cigarettes, Pipe, Cigars    Quit date: 11/08/2003  .  Smokeless tobacco: Former Neurosurgeon    Quit date: 11/08/2003  . Alcohol Use: No  . Drug Use: No  . Sexual Activity: No   Other Topics Concern  . Not on file   Social History Narrative   ** Merged History Encounter **       Family History  Problem Relation Age of Onset  . Hyperlipidemia Mother   . Hypertension Mother   . Deep vein thrombosis Mother   . Prostate cancer Father   . Colon cancer Sister   . Cancer Father 38  . Pulmonary embolism Mother   . Diabetes Mother    Current Outpatient Prescriptions on File Prior to Visit  Medication Sig Dispense Refill  . acetaminophen (TYLENOL) 500 MG tablet Take 1,500 mg by mouth at bedtime as needed. For pain      . amLODipine (NORVASC) 5 MG tablet Take 1 tablet (5 mg total) by mouth daily.  90 tablet  1  . aspirin (ASPIRIN CHILDRENS) 81 MG chewable tablet Chew 81 mg by mouth daily.        . Cholecalciferol (VITAMIN D3) 2000 UNITS capsule  Take 2,000 Units by mouth 2 (two) times daily.       . citalopram (CELEXA) 20 MG tablet TAKE 1 TABLET DAILY  30 tablet  2  . docusate sodium (STOOL SOFTENER) 100 MG capsule Take 300 mg by mouth 2 (two) times daily.      . fentaNYL (DURAGESIC) 50 MCG/HR Place 1 patch (50 mcg total) onto the skin every 3 (three) days.  10 patch  0  . finasteride (PROSCAR) 5 MG tablet Take 1 tablet (5 mg total) by mouth daily.  90 tablet  1  . furosemide (LASIX) 40 MG tablet Take 1 tablet (40 mg total) by mouth daily.  90 tablet  1  . losartan (COZAAR) 50 MG tablet Take 1 tablet (50 mg total) by mouth daily.  30 tablet  1  . magnesium oxide (MAG-OX) 400 MG tablet Take 1,600 mg by mouth at bedtime. INCLUDES: Zinc 15mg       . mupirocin ointment (BACTROBAN) 2 % Apply topically 2 (two) times daily.  30 g  1  . nebivolol (BYSTOLIC) 5 MG tablet Take 1 tablet (5 mg total) by mouth daily.  30 tablet  2  . Omega-3 Fatty Acids (FISH OIL) 1200 MG CAPS Take 1 capsule by mouth 5 (five) times daily.      Marland Kitchen omeprazole (PRILOSEC) 20 MG capsule  TAKE  (1)  CAPSULE  TWICE DAILY.  60 capsule  5  . oxyCODONE-acetaminophen (PERCOCET) 5-325 MG per tablet Take 1 tablet by mouth every 8 (eight) hours as needed for pain.  45 tablet  0  . rosuvastatin (CRESTOR) 10 MG tablet Take 5 mg by mouth daily.       Marland Kitchen terazosin (HYTRIN) 10 MG capsule Take 10 mg by mouth at bedtime.        Marland Kitchen terazosin (HYTRIN) 5 MG capsule TAKE 1 CAPSULE AT BEDTIME  30 capsule  4  . vitamin B-12 (CYANOCOBALAMIN) 1000 MCG tablet Take 1,000 mcg by mouth daily.      . metFORMIN (GLUCOPHAGE-XR) 500 MG 24 hr tablet Take 500 mg by mouth 2 (two) times daily.        No current facility-administered medications on file prior to visit.   Allergies  Allergen Reactions  . Actos [Pioglitazone]     Allergic reaction  . Bactrim   . Celebrex [Celecoxib]   . Flagyl [Metronidazole Hcl]   . Gabapentin   . Lyrica [Pregabalin]   . Lyrica [Pregabalin]     Increase appetite   . Morphine And Related   . Motrin [Ibuprofen]   . Nsaids   . Nsaids     Swelling    . Ultram [Tramadol Hcl]   . Ultram [Tramadol Hcl]     Itching / rash     . Vioxx [Rofecoxib]     Swelling    . Celebrex [Celecoxib] Rash   Immunization History  Administered Date(s) Administered  . Influenza Whole 10/30/2009  . Influenza-Unspecified 09/29/2013  . Pneumococcal Polysaccharide-23 02/28/1999   Prior to Admission medications   Medication Sig Start Date End Date Taking? Authorizing Provider  acetaminophen (TYLENOL) 500 MG tablet Take 1,500 mg by mouth at bedtime as needed. For pain   Yes Historical Provider, MD  amLODipine (NORVASC) 5 MG tablet Take 1 tablet (5 mg total) by mouth daily. 10/04/13  Yes Ileana Ladd, MD  aspirin (ASPIRIN CHILDRENS) 81 MG chewable tablet Chew 81 mg by mouth daily.     Yes Historical Provider, MD  Cholecalciferol (VITAMIN D3)  2000 UNITS capsule Take 2,000 Units by mouth 2 (two) times daily.    Yes Historical Provider, MD  citalopram (CELEXA) 20 MG tablet TAKE 1 TABLET DAILY  11/24/13  Yes Ileana Ladd, MD  docusate sodium (STOOL SOFTENER) 100 MG capsule Take 300 mg by mouth 2 (two) times daily.   Yes Historical Provider, MD  doxycycline (VIBRA-TABS) 100 MG tablet Take 1 tablet (100 mg total) by mouth 2 (two) times daily. 12/20/13  Yes Ileana Ladd, MD  fentaNYL (DURAGESIC) 50 MCG/HR Place 1 patch (50 mcg total) onto the skin every 3 (three) days. 12/03/13  Yes Ileana Ladd, MD  finasteride (PROSCAR) 5 MG tablet Take 1 tablet (5 mg total) by mouth daily. 10/04/13  Yes Ileana Ladd, MD  furosemide (LASIX) 40 MG tablet Take 1 tablet (40 mg total) by mouth daily. 10/04/13  Yes Ileana Ladd, MD  losartan (COZAAR) 50 MG tablet Take 1 tablet (50 mg total) by mouth daily. 11/05/13  Yes Ileana Ladd, MD  magnesium oxide (MAG-OX) 400 MG tablet Take 1,600 mg by mouth at bedtime. INCLUDES: Zinc 15mg    Yes Historical Provider, MD  mupirocin ointment (BACTROBAN) 2 % Apply topically 2 (two) times daily. 12/13/13  Yes Mary-Margaret Daphine Deutscher, FNP  nebivolol (BYSTOLIC) 5 MG tablet Take 1 tablet (5 mg total) by mouth daily. 10/07/13  Yes Tammy Eckard, PHARMD  Omega-3 Fatty Acids (FISH OIL) 1200 MG CAPS Take 1 capsule by mouth 5 (five) times daily.   Yes Historical Provider, MD  omeprazole (PRILOSEC) 20 MG capsule TAKE  (1)  CAPSULE  TWICE DAILY. 11/12/13  Yes Ileana Ladd, MD  oxyCODONE-acetaminophen (PERCOCET) 5-325 MG per tablet Take 1 tablet by mouth every 8 (eight) hours as needed for pain. 11/01/13  Yes Ileana Ladd, MD  rosuvastatin (CRESTOR) 10 MG tablet Take 5 mg by mouth daily.    Yes Historical Provider, MD  terazosin (HYTRIN) 10 MG capsule Take 10 mg by mouth at bedtime.     Yes Historical Provider, MD  terazosin (HYTRIN) 5 MG capsule TAKE 1 CAPSULE AT BEDTIME 11/22/13  Yes Ileana Ladd, MD  vitamin B-12 (CYANOCOBALAMIN) 1000 MCG tablet Take 1,000 mcg by mouth daily.   Yes Historical Provider, MD  metFORMIN (GLUCOPHAGE-XR) 500 MG 24 hr tablet Take 500 mg by mouth 2  (two) times daily.     Historical Provider, MD     ROS: As above in the HPI. All other systems are stable or negative.  OBJECTIVE: APPEARANCE:  Patient in no acute distress.The patient appeared well nourished and normally developed. Acyanotic. Waist: VITAL SIGNS:BP 88/56  Pulse 78  Temp(Src) 97 F (36.1 C) (Oral)  Ht 5\' 8"  (1.727 m)  Wt 218 lb (98.884 kg)  BMI 33.15 kg/m2   SKIN: warm and  Dry without overt  tattoos and scars The heel looks fine with resolved cellulitis and scaling around the edges. There are 2 new pustular lesions on the lateral. side of the heel.  HEAD and Neck: without JVD, Head and scalp: normal Eyes:No scleral icterus. Fundi normal, eye movements normal. Ears: Auricle normal, canal normal, Tympanic membranes normal, insufflation normal. Nose: normal Throat: normal Neck & thyroid: normal  CHEST & LUNGS: Chest wall: normal Lungs: Clear  CVS: Reveals the PMI to be normally located. Regular rhythm, First and Second Heart sounds are normal,  absence of murmurs, rubs or gallops. Peripheral vasculature: Radial pulses: normal Dorsal pedis pulses: normal Posterior pulses: normal  ABDOMEN:  Appearance:  obese Benign, no organomegaly, no masses, no Abdominal Aortic enlargement. No Guarding , no rebound. No Bruits. Bowel sounds: normal  RECTAL: N/A GU: N/A  EXTREMETIES: as per skin exam  ASSESSMENT:  Blister - Plan: doxycycline (VIBRA-TABS) 100 MG tablet, Ambulatory referral to Dermatology  Decubitus ulcers, stage I  Diabetes mellitus  CAD (coronary artery disease)  Diabetic neuropathy  Diabetic retinopathy  HTN (hypertension)  Hyperlipidemia  Kidney disease  Heel ulcer, left - small abrasions of left heel. - Plan: doxycycline (VIBRA-TABS) 100 MG tablet   recurring pustular eruption of the left heel.suspect that the pustular eruption is related to being a DM Will consult Dermatology.  PLAN:  Orders Placed This Encounter   Procedures  . Ambulatory referral to Dermatology    Referral Priority:  Routine    Referral Type:  Consultation    Referral Reason:  Specialty Services Required    Requested Specialty:  Dermatology    Number of Visits Requested:  1   Meds ordered this encounter  Medications  . doxycycline (VIBRA-TABS) 100 MG tablet    Sig: Take 1 tablet (100 mg total) by mouth 2 (two) times daily.    Dispense:  20 tablet    Refill:  0   Medications Discontinued During This Encounter  Medication Reason  . doxycycline (VIBRA-TABS) 100 MG tablet Completed Course  . finasteride (PROSCAR) 5 MG tablet Duplicate   Return in about 1 week (around 12/27/2013) for recheck heel.  Margo Lama P. Modesto Charon, M.D.

## 2013-12-21 ENCOUNTER — Telehealth: Payer: Self-pay | Admitting: Family Medicine

## 2013-12-21 NOTE — Telephone Encounter (Signed)
appt time 01-03-13 at 4pm left on daugther beth cell phone  No answer at pt home phone

## 2014-01-03 ENCOUNTER — Ambulatory Visit (INDEPENDENT_AMBULATORY_CARE_PROVIDER_SITE_OTHER): Payer: Medicare HMO | Admitting: Family Medicine

## 2014-01-03 ENCOUNTER — Encounter: Payer: Self-pay | Admitting: Family Medicine

## 2014-01-03 VITALS — BP 119/69 | HR 81 | Temp 98.0°F | Ht 68.0 in | Wt 215.2 lb

## 2014-01-03 DIAGNOSIS — E1149 Type 2 diabetes mellitus with other diabetic neurological complication: Secondary | ICD-10-CM

## 2014-01-03 DIAGNOSIS — T148XXA Other injury of unspecified body region, initial encounter: Secondary | ICD-10-CM

## 2014-01-03 DIAGNOSIS — E11319 Type 2 diabetes mellitus with unspecified diabetic retinopathy without macular edema: Secondary | ICD-10-CM

## 2014-01-03 DIAGNOSIS — N289 Disorder of kidney and ureter, unspecified: Secondary | ICD-10-CM

## 2014-01-03 DIAGNOSIS — E1142 Type 2 diabetes mellitus with diabetic polyneuropathy: Secondary | ICD-10-CM

## 2014-01-03 DIAGNOSIS — I82409 Acute embolism and thrombosis of unspecified deep veins of unspecified lower extremity: Secondary | ICD-10-CM

## 2014-01-03 DIAGNOSIS — I2510 Atherosclerotic heart disease of native coronary artery without angina pectoris: Secondary | ICD-10-CM

## 2014-01-03 DIAGNOSIS — E1139 Type 2 diabetes mellitus with other diabetic ophthalmic complication: Secondary | ICD-10-CM

## 2014-01-03 DIAGNOSIS — I10 Essential (primary) hypertension: Secondary | ICD-10-CM

## 2014-01-03 DIAGNOSIS — E785 Hyperlipidemia, unspecified: Secondary | ICD-10-CM

## 2014-01-03 DIAGNOSIS — L8990 Pressure ulcer of unspecified site, unspecified stage: Secondary | ICD-10-CM

## 2014-01-03 DIAGNOSIS — E119 Type 2 diabetes mellitus without complications: Secondary | ICD-10-CM

## 2014-01-03 DIAGNOSIS — E1140 Type 2 diabetes mellitus with diabetic neuropathy, unspecified: Secondary | ICD-10-CM

## 2014-01-03 MED ORDER — KETOCONAZOLE 2 % EX CREA: 1.0000 "application " | TOPICAL_CREAM | Freq: Two times a day (BID) | CUTANEOUS | Status: AC

## 2014-01-03 MED ORDER — FENTANYL 50 MCG/HR TD PT72: 50.0000 ug | MEDICATED_PATCH | TRANSDERMAL | Status: DC

## 2014-01-03 NOTE — Progress Notes (Signed)
Patient ID: [name], male   DOB: 04-28-1930, 80 y.o.   MRN: [id number] SUBJECTIVE: CC: Chief Complaint  Patient presents with  . Follow-up    reck blister left heel.  refill fenanyl patch. stataes he had to stop the doxycycline --caused him to have black spots in his eyes and when stoipped spots went away    HPI: Here for follow up of the pustular lesions of the left foot/heel with the blistering of the heel and cellulitis. Got better again and is good. Needs a refill of his fentanyl patch. Has an appointment with the Dermatologist Dr Allyson Sabal. Feet scaly.  Other medical problems is  Stable. His CBGs are in the low 100s. And his BP is  Stable.  He was intolerant to the doxycycline. The symptoms have resolved. Past Medical History  Diagnosis Date  . DVT of leg (deep venous thrombosis) 1999    RLE   . Hypertension   . Hiatal hernia   . Lumbar spondylosis   . Thyroid nodule   . Goiter     stable x many year - last endo exam 08/2010  . Sleep apnea   . Type 2 diabetes mellitus   . Chronic kidney disease   . Diabetic retinopathy     right eye   . Diabetic neuropathy   . Osteopenia   . CAD (coronary artery disease)     (Left main normal, LAD 30-40% mid stenosis, diagonal 30-40% stenosis, circumflex 30-40% stenosis, right coronary artery 30-40% stenosis. 2003.)  . Sleep apnea     CPAP  . GERD (gastroesophageal reflux disease)   . Diabetes mellitus   . Hyperlipidemia   . HTN (hypertension)   . Prostate cancer   . Chronic back pain   . DVT (deep venous thrombosis)    Past Surgical History  Procedure Laterality Date  . Tonsilectomy, adenoidectomy, bilateral myringotomy and tubes    . Inguinal hernia repair      bilateral  . Knee arthroscopy      left   . Biopsy of right ear    . Cholecystectomy    . Cataract extraction, bilateral    . Tonsillectomy and adenoidectomy    . Appendectomy    . Inguinal hernia repair      Bilateral   History   Social History  .  Marital Status: Divorced    Spouse Name: N/A    Number of Children: 58  . Years of Education: N/A   Occupational History  . Retired    Social History Main Topics  . Smoking status: Former Smoker    Types: Cigarettes, Pipe, Cigars    Quit date: 11/08/2003  . Smokeless tobacco: Former Systems developer    Quit date: 11/08/2003  . Alcohol Use: No  . Drug Use: No  . Sexual Activity: No   Other Topics Concern  . Not on file   Social History Narrative   ** Merged History Encounter **       Family History  Problem Relation Age of Onset  . Hyperlipidemia Mother   . Hypertension Mother   . Deep vein thrombosis Mother   . Prostate cancer Father   . Colon cancer Sister   . Cancer Father 11  . Pulmonary embolism Mother   . Diabetes Mother    Current Outpatient Prescriptions on File Prior to Visit  Medication Sig Dispense Refill  . acetaminophen (TYLENOL) 500 MG tablet Take 1,500 mg by mouth at bedtime as needed. For pain      .  amLODipine (NORVASC) 5 MG tablet Take 1 tablet (5 mg total) by mouth daily.  90 tablet  1  . aspirin (ASPIRIN CHILDRENS) 81 MG chewable tablet Chew 81 mg by mouth daily.        . Cholecalciferol (VITAMIN D3) 2000 UNITS capsule Take 2,000 Units by mouth 2 (two) times daily.       . citalopram (CELEXA) 20 MG tablet TAKE 1 TABLET DAILY  30 tablet  2  . docusate sodium (STOOL SOFTENER) 100 MG capsule Take 300 mg by mouth 2 (two) times daily.      . finasteride (PROSCAR) 5 MG tablet Take 1 tablet (5 mg total) by mouth daily.  90 tablet  1  . furosemide (LASIX) 40 MG tablet Take 1 tablet (40 mg total) by mouth daily.  90 tablet  1  . losartan (COZAAR) 50 MG tablet Take 1 tablet (50 mg total) by mouth daily.  30 tablet  1  . magnesium oxide (MAG-OX) 400 MG tablet Take 1,600 mg by mouth at bedtime. INCLUDES: Zinc 15mg       . metFORMIN (GLUCOPHAGE-XR) 500 MG 24 hr tablet Take 500 mg by mouth 2 (two) times daily.       . mupirocin ointment (BACTROBAN) 2 % Apply topically 2 (two)  times daily.  30 g  1  . nebivolol (BYSTOLIC) 5 MG tablet Take 1 tablet (5 mg total) by mouth daily.  30 tablet  2  . Omega-3 Fatty Acids (FISH OIL) 1200 MG CAPS Take 1 capsule by mouth 5 (five) times daily.      Marland Kitchen omeprazole (PRILOSEC) 20 MG capsule TAKE  (1)  CAPSULE  TWICE DAILY.  60 capsule  5  . oxyCODONE-acetaminophen (PERCOCET) 5-325 MG per tablet Take 1 tablet by mouth every 8 (eight) hours as needed for pain.  45 tablet  0  . rosuvastatin (CRESTOR) 10 MG tablet Take 5 mg by mouth daily.       Marland Kitchen terazosin (HYTRIN) 10 MG capsule Take 10 mg by mouth at bedtime.        Marland Kitchen terazosin (HYTRIN) 5 MG capsule TAKE 1 CAPSULE AT BEDTIME  30 capsule  4  . vitamin B-12 (CYANOCOBALAMIN) 1000 MCG tablet Take 1,000 mcg by mouth daily.      Marland Kitchen doxycycline (VIBRA-TABS) 100 MG tablet Take 1 tablet (100 mg total) by mouth 2 (two) times daily.  20 tablet  0   No current facility-administered medications on file prior to visit.   Allergies  Allergen Reactions  . Actos [Pioglitazone]     Allergic reaction  . Bactrim   . Celebrex [Celecoxib]   . Doxycycline     Caused black spots in his eyes  . Flagyl [Metronidazole Hcl]   . Gabapentin   . Lyrica [Pregabalin]   . Lyrica [Pregabalin]     Increase appetite   . Morphine And Related   . Motrin [Ibuprofen]   . Nsaids   . Nsaids     Swelling    . Ultram [Tramadol Hcl]   . Ultram [Tramadol Hcl]     Itching / rash     . Vioxx [Rofecoxib]     Swelling    . Celebrex [Celecoxib] Rash   Immunization History  Administered Date(s) Administered  . Influenza Whole 10/30/2009  . Influenza-Unspecified 09/29/2013  . Pneumococcal Polysaccharide-23 02/28/1999   Prior to Admission medications   Medication Sig Start Date End Date Taking? Authorizing Provider  acetaminophen (TYLENOL) 500 MG tablet Take 1,500 mg by  mouth at bedtime as needed. For pain    Historical Provider, MD  amLODipine (NORVASC) 5 MG tablet Take 1 tablet (5 mg total) by mouth daily.  10/04/13   Vernie Shanks, MD  aspirin (ASPIRIN CHILDRENS) 81 MG chewable tablet Chew 81 mg by mouth daily.      Historical Provider, MD  Cholecalciferol (VITAMIN D3) 2000 UNITS capsule Take 2,000 Units by mouth 2 (two) times daily.     Historical Provider, MD  citalopram (CELEXA) 20 MG tablet TAKE 1 TABLET DAILY 11/24/13   Vernie Shanks, MD  docusate sodium (STOOL SOFTENER) 100 MG capsule Take 300 mg by mouth 2 (two) times daily.    Historical Provider, MD  doxycycline (VIBRA-TABS) 100 MG tablet Take 1 tablet (100 mg total) by mouth 2 (two) times daily. 12/20/13   Vernie Shanks, MD  fentaNYL (DURAGESIC) 50 MCG/HR Place 1 patch (50 mcg total) onto the skin every 3 (three) days. 12/03/13   Vernie Shanks, MD  finasteride (PROSCAR) 5 MG tablet Take 1 tablet (5 mg total) by mouth daily. 10/04/13   Vernie Shanks, MD  furosemide (LASIX) 40 MG tablet Take 1 tablet (40 mg total) by mouth daily. 10/04/13   Vernie Shanks, MD  losartan (COZAAR) 50 MG tablet Take 1 tablet (50 mg total) by mouth daily. 11/05/13   Vernie Shanks, MD  magnesium oxide (MAG-OX) 400 MG tablet Take 1,600 mg by mouth at bedtime. INCLUDES: Zinc 15mg     Historical Provider, MD  metFORMIN (GLUCOPHAGE-XR) 500 MG 24 hr tablet Take 500 mg by mouth 2 (two) times daily.     Historical Provider, MD  mupirocin ointment (BACTROBAN) 2 % Apply topically 2 (two) times daily. 12/13/13   Mary-Margaret Hassell Done, FNP  nebivolol (BYSTOLIC) 5 MG tablet Take 1 tablet (5 mg total) by mouth daily. 10/07/13   Tammy Eckard, PHARMD  Omega-3 Fatty Acids (FISH OIL) 1200 MG CAPS Take 1 capsule by mouth 5 (five) times daily.    Historical Provider, MD  omeprazole (PRILOSEC) 20 MG capsule TAKE  (1)  CAPSULE  TWICE DAILY. 11/12/13   Vernie Shanks, MD  oxyCODONE-acetaminophen (PERCOCET) 5-325 MG per tablet Take 1 tablet by mouth every 8 (eight) hours as needed for pain. 11/01/13   Vernie Shanks, MD  rosuvastatin (CRESTOR) 10 MG tablet Take 5 mg by mouth daily.      Historical Provider, MD  terazosin (HYTRIN) 10 MG capsule Take 10 mg by mouth at bedtime.      Historical Provider, MD  terazosin (HYTRIN) 5 MG capsule TAKE 1 CAPSULE AT BEDTIME 11/22/13   Vernie Shanks, MD  vitamin B-12 (CYANOCOBALAMIN) 1000 MCG tablet Take 1,000 mcg by mouth daily.    Historical Provider, MD     ROS: As above in the HPI. All other systems are stable or negative.  OBJECTIVE: APPEARANCE:  Patient in no acute distress.The patient appeared well nourished and normally developed. Acyanotic. Waist: VITAL SIGNS:BP 119/69  Pulse 81  Temp(Src) 98 F (36.7 C) (Oral)  Ht 5\' 8"  (1.727 m)  Wt 215 lb 3.2 oz (97.614 kg)  BMI 32.73 kg/m2 Obese WM.  SKIN: warm and  Dry without, tattoos and scars. The pustular lesions around the ankles are dry again and resolving . The blister of the heel is dry and resolved. The cellulitis is resolved. See the photo in media. The sole of the foot is scaly.   HEAD and Neck: without JVD, Head and scalp: normal Eyes:No  scleral icterus. Fundi normal, eye movements normal. Ears: Auricle normal, canal normal, Tympanic membranes normal, insufflation normal. Nose: normal Throat: normal Neck & thyroid: normal  CHEST & LUNGS: Chest wall: normal Lungs: Clear  CVS: Reveals the PMI to be normally located. Regular rhythm, First and Second Heart sounds are normal,  absence of murmurs, rubs or gallops. Peripheral vasculature: Radial pulses: normal Dorsal pedis pulses: normal Posterior pulses: normal  ABDOMEN:  Appearance: normal Benign, no organomegaly, no masses, no Abdominal Aortic enlargement. No Guarding , no rebound. No Bruits. Bowel sounds: normal  RECTAL: N/A GU: N/A  EXTREMETIES: nonedematous.  MUSCULOSKELETAL:  Spine: reduced ROM chronic pain.  ASSESSMENT: Blister - Plan: ketoconazole (NIZORAL) 2 % cream  CAD (coronary artery disease)  Decubitus ulcers  Diabetes mellitus  Diabetic neuropathy - Plan: fentaNYL  (DURAGESIC) 50 MCG/HR  Diabetic retinopathy  HTN (hypertension)  Hyperlipidemia  Kidney disease  Diabetes mellitus, type 2  DVT (deep venous thrombosis), unspecified laterality Suspect an underlying fungal associated etiology with his DM as a major risk for this.   PLAN: Keep appointment with the dentist. No orders of the defined types were placed in this encounter.   Meds ordered this encounter  Medications  . ketoconazole (NIZORAL) 2 % cream    Sig: Apply 1 application topically 2 (two) times daily.    Dispense:  60 g    Refill:  0  . fentaNYL (DURAGESIC) 50 MCG/HR    Sig: Place 1 patch (50 mcg total) onto the skin every 3 (three) days.    Dispense:  10 patch    Refill:  0   Medications Discontinued During This Encounter  Medication Reason  . fentaNYL (DURAGESIC) 50 MCG/HR Reorder   Return in about 4 weeks (around 01/31/2014) for Recheck medical problems.  Saanya Zieske P. Jacelyn Grip, M.D.

## 2014-01-11 ENCOUNTER — Ambulatory Visit: Payer: Medicare Other | Admitting: Family Medicine

## 2014-01-28 ENCOUNTER — Other Ambulatory Visit: Payer: Self-pay | Admitting: *Deleted

## 2014-01-28 MED ORDER — ACCU-CHEK MULTICLIX LANCETS MISC: Status: DC

## 2014-01-28 MED ORDER — GLUCOSE BLOOD VI STRP: ORAL_STRIP | Status: DC

## 2014-01-31 ENCOUNTER — Other Ambulatory Visit: Payer: Self-pay | Admitting: Family Medicine

## 2014-02-03 ENCOUNTER — Encounter: Payer: Self-pay | Admitting: Family Medicine

## 2014-02-03 ENCOUNTER — Ambulatory Visit (INDEPENDENT_AMBULATORY_CARE_PROVIDER_SITE_OTHER): Payer: Medicare HMO | Admitting: Family Medicine

## 2014-02-03 VITALS — BP 145/79 | HR 70 | Temp 99.1°F | Ht 68.0 in | Wt 218.0 lb

## 2014-02-03 DIAGNOSIS — E11319 Type 2 diabetes mellitus with unspecified diabetic retinopathy without macular edema: Secondary | ICD-10-CM

## 2014-02-03 DIAGNOSIS — E1149 Type 2 diabetes mellitus with other diabetic neurological complication: Secondary | ICD-10-CM

## 2014-02-03 DIAGNOSIS — E785 Hyperlipidemia, unspecified: Secondary | ICD-10-CM

## 2014-02-03 DIAGNOSIS — I10 Essential (primary) hypertension: Secondary | ICD-10-CM

## 2014-02-03 DIAGNOSIS — I2510 Atherosclerotic heart disease of native coronary artery without angina pectoris: Secondary | ICD-10-CM

## 2014-02-03 DIAGNOSIS — N289 Disorder of kidney and ureter, unspecified: Secondary | ICD-10-CM

## 2014-02-03 DIAGNOSIS — E119 Type 2 diabetes mellitus without complications: Secondary | ICD-10-CM

## 2014-02-03 DIAGNOSIS — E1142 Type 2 diabetes mellitus with diabetic polyneuropathy: Secondary | ICD-10-CM

## 2014-02-03 DIAGNOSIS — E1140 Type 2 diabetes mellitus with diabetic neuropathy, unspecified: Secondary | ICD-10-CM

## 2014-02-03 DIAGNOSIS — E1139 Type 2 diabetes mellitus with other diabetic ophthalmic complication: Secondary | ICD-10-CM

## 2014-02-03 MED ORDER — FENTANYL 50 MCG/HR TD PT72: 50.0000 ug | MEDICATED_PATCH | TRANSDERMAL | Status: DC

## 2014-02-03 NOTE — Progress Notes (Signed)
Patient ID: [name], male   DOB: [birth year], 80 y.o.   MRN: [id number] SUBJECTIVE: CC: Chief Complaint  Patient presents with  . Follow-up    4 week follow up  refill fentaynl     HPI: Saw dermatologist: had a scraping for test. Was Rx a salve. fett fine now  Patient is here for follow up of Diabetes Mellitus/chronic pain/HTN/lumbar spondylosis/CAD: Symptoms evaluated: Denies Nocturia ,Denies Urinary Frequency , denies Blurred vision ,deniesDizziness,denies.Dysuria,denies paresthesias, denies extremity pain or ulcers.Marland Kitchendenies chest pain. has had an annual eye exam. do check the feet. Does check CBGs. Average CBG:120-140 Denies episodes of hypoglycemia. Does have an emergency hypoglycemic plan. admits toCompliance with medications. Denies Problems with medications. BPs 120/80- 140/90 Past Medical History  Diagnosis Date  . DVT of leg (deep venous thrombosis) 1999    RLE   . Hypertension   . Hiatal hernia   . Lumbar spondylosis   . Thyroid nodule   . Goiter     stable x many year - last endo exam 08/2010  . Sleep apnea   . Type 2 diabetes mellitus   . Chronic kidney disease   . Diabetic retinopathy     right eye   . Diabetic neuropathy   . Osteopenia   . CAD (coronary artery disease)     (Left main normal, LAD 30-40% mid stenosis, diagonal 30-40% stenosis, circumflex 30-40% stenosis, right coronary artery 30-40% stenosis. 2003.)  . Sleep apnea     CPAP  . GERD (gastroesophageal reflux disease)   . Diabetes mellitus   . Hyperlipidemia   . HTN (hypertension)   . Prostate cancer   . Chronic back pain   . DVT (deep venous thrombosis)    Past Surgical History  Procedure Laterality Date  . Tonsilectomy, adenoidectomy, bilateral myringotomy and tubes    . Inguinal hernia repair      bilateral  . Knee arthroscopy      left   . Biopsy of right ear    . Cholecystectomy    . Cataract extraction, bilateral    . Tonsillectomy and adenoidectomy    . Appendectomy     . Inguinal hernia repair      Bilateral   History   Social History  . Marital Status: Divorced    Spouse Name: N/A    Number of Children: 65  . Years of Education: N/A   Occupational History  . Retired    Social History Main Topics  . Smoking status: Former Smoker    Types: Cigarettes, Pipe, Cigars    Quit date: 11/08/2003  . Smokeless tobacco: Former Systems developer    Quit date: 11/08/2003  . Alcohol Use: No  . Drug Use: No  . Sexual Activity: No   Other Topics Concern  . Not on file   Social History Narrative   ** Merged History Encounter **       Family History  Problem Relation Age of Onset  . Hyperlipidemia Mother   . Hypertension Mother   . Deep vein thrombosis Mother   . Prostate cancer Father   . Colon cancer Sister   . Cancer Father 52  . Pulmonary embolism Mother   . Diabetes Mother    Current Outpatient Prescriptions on File Prior to Visit  Medication Sig Dispense Refill  . acetaminophen (TYLENOL) 500 MG tablet Take 1,500 mg by mouth at bedtime as needed. For pain      . amLODipine (NORVASC) 5 MG tablet Take 1 tablet (5 mg  total) by mouth daily.  90 tablet  1  . aspirin (ASPIRIN CHILDRENS) 81 MG chewable tablet Chew 81 mg by mouth daily.        . Cholecalciferol (VITAMIN D3) 2000 UNITS capsule Take 2,000 Units by mouth 2 (two) times daily.       . citalopram (CELEXA) 20 MG tablet TAKE 1 TABLET DAILY  30 tablet  2  . docusate sodium (STOOL SOFTENER) 100 MG capsule Take 300 mg by mouth 2 (two) times daily.      . finasteride (PROSCAR) 5 MG tablet Take 1 tablet (5 mg total) by mouth daily.  90 tablet  1  . furosemide (LASIX) 40 MG tablet Take 1 tablet (40 mg total) by mouth daily.  90 tablet  1  . glucose blood test strip Test Blood Sugar Bid.  Dx 250.00  100 each  2  . Lancets (ACCU-CHEK MULTICLIX) lancets Test bid, Dx 250.00  100 each  2  . losartan (COZAAR) 50 MG tablet TAKE 1 TABLET DAILY  90 tablet  1  . magnesium oxide (MAG-OX) 400 MG tablet Take 1,600 mg  by mouth at bedtime. INCLUDES: Zinc 15mg       . metFORMIN (GLUCOPHAGE-XR) 500 MG 24 hr tablet Take 500 mg by mouth 2 (two) times daily.       . mupirocin ointment (BACTROBAN) 2 % Apply topically 2 (two) times daily.  30 g  1  . nebivolol (BYSTOLIC) 5 MG tablet Take 1 tablet (5 mg total) by mouth daily.  30 tablet  2  . Omega-3 Fatty Acids (FISH OIL) 1200 MG CAPS Take 1 capsule by mouth 5 (five) times daily.      Marland Kitchen omeprazole (PRILOSEC) 20 MG capsule TAKE  (1)  CAPSULE  TWICE DAILY.  60 capsule  5  . oxyCODONE-acetaminophen (PERCOCET) 5-325 MG per tablet Take 1 tablet by mouth every 8 (eight) hours as needed for pain.  45 tablet  0  . rosuvastatin (CRESTOR) 10 MG tablet Take 5 mg by mouth daily.       Marland Kitchen terazosin (HYTRIN) 10 MG capsule Take 10 mg by mouth at bedtime.        Marland Kitchen terazosin (HYTRIN) 5 MG capsule TAKE 1 CAPSULE AT BEDTIME  30 capsule  4  . vitamin B-12 (CYANOCOBALAMIN) 1000 MCG tablet Take 1,000 mcg by mouth daily.      Marland Kitchen doxycycline (VIBRA-TABS) 100 MG tablet Take 1 tablet (100 mg total) by mouth 2 (two) times daily.  20 tablet  0   No current facility-administered medications on file prior to visit.   Allergies  Allergen Reactions  . Actos [Pioglitazone]     Allergic reaction  . Bactrim   . Celebrex [Celecoxib]   . Doxycycline     Caused black spots in his eyes  . Flagyl [Metronidazole Hcl]   . Gabapentin   . Lyrica [Pregabalin]   . Lyrica [Pregabalin]     Increase appetite   . Morphine And Related   . Motrin [Ibuprofen]   . Nsaids   . Nsaids     Swelling    . Ultram [Tramadol Hcl]   . Ultram [Tramadol Hcl]     Itching / rash     . Vioxx [Rofecoxib]     Swelling    . Celebrex [Celecoxib] Rash   Immunization History  Administered Date(s) Administered  . Influenza Whole 10/30/2009  . Influenza-Unspecified 09/29/2013  . Pneumococcal Polysaccharide-23 02/28/1999   Prior to Admission medications   Medication  Sig Start Date End Date Taking? Authorizing  Provider  acetaminophen (TYLENOL) 500 MG tablet Take 1,500 mg by mouth at bedtime as needed. For pain    Historical Provider, MD  amLODipine (NORVASC) 5 MG tablet Take 1 tablet (5 mg total) by mouth daily. 10/04/13   Vernie Shanks, MD  aspirin (ASPIRIN CHILDRENS) 81 MG chewable tablet Chew 81 mg by mouth daily.      Historical Provider, MD  Cholecalciferol (VITAMIN D3) 2000 UNITS capsule Take 2,000 Units by mouth 2 (two) times daily.     Historical Provider, MD  citalopram (CELEXA) 20 MG tablet TAKE 1 TABLET DAILY 11/24/13   Vernie Shanks, MD  docusate sodium (STOOL SOFTENER) 100 MG capsule Take 300 mg by mouth 2 (two) times daily.    Historical Provider, MD  doxycycline (VIBRA-TABS) 100 MG tablet Take 1 tablet (100 mg total) by mouth 2 (two) times daily. 12/20/13   Vernie Shanks, MD  fentaNYL (DURAGESIC) 50 MCG/HR Place 1 patch (50 mcg total) onto the skin every 3 (three) days. 01/03/14   Vernie Shanks, MD  finasteride (PROSCAR) 5 MG tablet Take 1 tablet (5 mg total) by mouth daily. 10/04/13   Vernie Shanks, MD  furosemide (LASIX) 40 MG tablet Take 1 tablet (40 mg total) by mouth daily. 10/04/13   Vernie Shanks, MD  glucose blood test strip Test Blood Sugar Bid.  Dx 250.00 01/28/14   Vernie Shanks, MD  Lancets (ACCU-CHEK MULTICLIX) lancets Test bid, Dx 250.00 01/28/14   Vernie Shanks, MD  losartan (COZAAR) 50 MG tablet TAKE 1 TABLET DAILY 01/31/14   Vernie Shanks, MD  magnesium oxide (MAG-OX) 400 MG tablet Take 1,600 mg by mouth at bedtime. INCLUDES: Zinc 15mg     Historical Provider, MD  metFORMIN (GLUCOPHAGE-XR) 500 MG 24 hr tablet Take 500 mg by mouth 2 (two) times daily.     Historical Provider, MD  mupirocin ointment (BACTROBAN) 2 % Apply topically 2 (two) times daily. 12/13/13   Mary-Margaret Hassell Done, FNP  nebivolol (BYSTOLIC) 5 MG tablet Take 1 tablet (5 mg total) by mouth daily. 10/07/13   Tammy Eckard, PHARMD  Omega-3 Fatty Acids (FISH OIL) 1200 MG CAPS Take 1 capsule by mouth 5 (five) times  daily.    Historical Provider, MD  omeprazole (PRILOSEC) 20 MG capsule TAKE  (1)  CAPSULE  TWICE DAILY. 11/12/13   Vernie Shanks, MD  oxyCODONE-acetaminophen (PERCOCET) 5-325 MG per tablet Take 1 tablet by mouth every 8 (eight) hours as needed for pain. 11/01/13   Vernie Shanks, MD  rosuvastatin (CRESTOR) 10 MG tablet Take 5 mg by mouth daily.     Historical Provider, MD  terazosin (HYTRIN) 10 MG capsule Take 10 mg by mouth at bedtime.      Historical Provider, MD  terazosin (HYTRIN) 5 MG capsule TAKE 1 CAPSULE AT BEDTIME 11/22/13   Vernie Shanks, MD  vitamin B-12 (CYANOCOBALAMIN) 1000 MCG tablet Take 1,000 mcg by mouth daily.    Historical Provider, MD     ROS: As above in the HPI. All other systems are stable or negative.  OBJECTIVE: APPEARANCE:  Patient in no acute distress.The patient appeared well nourished and normally developed. Acyanotic. Waist: VITAL SIGNS:BP 145/79  Pulse 70  Temp(Src) 99.1 F (37.3 C) (Oral)  Ht 5\' 8"  (1.727 m)  Wt 218 lb (98.884 kg)  BMI 33.15 kg/m2  WM Obese  SKIN: warm and  Dry without overt rashes, tattoos and scars.feet:  rash resolved.   HEAD and Neck: without JVD, Head and scalp: normal Eyes:No scleral icterus. Fundi normal, eye movements normal. Ears: Auricle normal, canal normal, Tympanic membranes normal, insufflation normal. Nose: normal Throat: normal Neck & thyroid: normal  CHEST & LUNGS: Chest wall: normal Lungs: Clear  CVS: Reveals the PMI to be normally located. Regular rhythm, First and Second Heart sounds are normal,  absence of murmurs, rubs or gallops. Peripheral vasculature: Radial pulses: normal Dorsal pedis pulses: normal Posterior pulses: normal  ABDOMEN:  Appearance: normal Benign, no organomegaly, no masses, no Abdominal Aortic enlargement. No Guarding , no rebound. No Bruits. Bowel sounds: normal  RECTAL: N/A GU: N/A  EXTREMETIES: nonedematous.  MUSCULOSKELETAL:  Spine: reduced ROM of the spine  with pain. Has to ambulate with a cane.   NEUROLOGIC: oriented to time,place and person; nonfocal. Strength is generalized weakness in legs. ambulatory Sensory is normal Reflexes are normal Cranial Nerves are normal.  ASSESSMENT:  HTN (hypertension)  Diabetic retinopathy  Kidney disease  Hyperlipidemia  Diabetic neuropathy - Plan: fentaNYL (DURAGESIC) 50 MCG/HR  Diabetes mellitus  CAD (coronary artery disease) Foot blisters resolved. Medical problems  Stable.   PLAN: DM foot care discussed and in the AVS.  No orders of the defined types were placed in this encounter.   Meds ordered this encounter  Medications  . ketoconazole (NIZORAL) 2 % cream    Sig:   . triamcinolone cream (KENALOG) 0.1 %    Sig:   . fentaNYL (DURAGESIC) 50 MCG/HR    Sig: Place 1 patch (50 mcg total) onto the skin every 3 (three) days.    Dispense:  10 patch    Refill:  0   Medications Discontinued During This Encounter  Medication Reason  . fentaNYL (DURAGESIC) 50 MCG/HR Reorder   Return in about 6 weeks (around 03/17/2014) for Recheck medical problems. and labwork.   Alyse Kathan P. Jacelyn Grip, M.D.

## 2014-02-03 NOTE — Patient Instructions (Signed)
Diabetes and Foot Care Diabetes may cause you to have problems because of poor blood supply (circulation) to your feet and legs. This may cause the skin on your feet to become thinner, break easier, and heal more slowly. Your skin may become dry, and the skin may peel and crack. You may also have nerve damage in your legs and feet causing decreased feeling in them. You may not notice minor injuries to your feet that could lead to infections or more serious problems. Taking care of your feet is one of the most important things you can do for yourself.  HOME CARE INSTRUCTIONS  Wear shoes at all times, even in the house. Do not go barefoot. Bare feet are easily injured.  Check your feet daily for blisters, cuts, and redness. If you cannot see the bottom of your feet, use a mirror or ask someone for help.  Wash your feet with warm water (do not use hot water) and mild soap. Then pat your feet and the areas between your toes until they are completely dry. Do not soak your feet as this can dry your skin.  Apply a moisturizing lotion or petroleum jelly (that does not contain alcohol and is unscented) to the skin on your feet and to dry, brittle toenails. Do not apply lotion between your toes.  Trim your toenails straight across. Do not dig under them or around the cuticle. File the edges of your nails with an emery board or nail file.  Do not cut corns or calluses or try to remove them with medicine.  Wear clean socks or stockings every day. Make sure they are not too tight. Do not wear knee-high stockings since they may decrease blood flow to your legs.  Wear shoes that fit properly and have enough cushioning. To break in new shoes, wear them for just a few hours a day. This prevents you from injuring your feet. Always look in your shoes before you put them on to be sure there are no objects inside.  Do not cross your legs. This may decrease the blood flow to your feet.  If you find a minor scrape,  cut, or break in the skin on your feet, keep it and the skin around it clean and dry. These areas may be cleansed with mild soap and water. Do not cleanse the area with peroxide, alcohol, or iodine.  When you remove an adhesive bandage, be sure not to damage the skin around it.  If you have a wound, look at it several times a day to make sure it is healing.  Do not use heating pads or hot water bottles. They may burn your skin. If you have lost feeling in your feet or legs, you may not know it is happening until it is too late.  Make sure your health care provider performs a complete foot exam at least annually or more often if you have foot problems. Report any cuts, sores, or bruises to your health care provider immediately. SEEK MEDICAL CARE IF:   You have an injury that is not healing.  You have cuts or breaks in the skin.  You have an ingrown nail.  You notice redness on your legs or feet.  You feel burning or tingling in your legs or feet.  You have pain or cramps in your legs and feet.  Your legs or feet are numb.  Your feet always feel cold. SEEK IMMEDIATE MEDICAL CARE IF:   There is increasing redness,   swelling, or pain in or around a wound.  There is a red line that goes up your leg.  Pus is coming from a wound.  You develop a fever or as directed by your health care provider.  You notice a bad smell coming from an ulcer or wound. Document Released: 12/13/2000 Document Revised: 08/18/2013 Document Reviewed: 05/25/2013 Exit[employer] Patient Information 2014 [employer].  

## 2014-02-14 ENCOUNTER — Other Ambulatory Visit: Payer: Self-pay | Admitting: Family Medicine

## 2014-02-15 ENCOUNTER — Other Ambulatory Visit: Payer: Self-pay | Admitting: Family Medicine

## 2014-02-23 ENCOUNTER — Other Ambulatory Visit: Payer: Self-pay | Admitting: Family Medicine

## 2014-03-07 ENCOUNTER — Other Ambulatory Visit: Payer: Self-pay

## 2014-03-07 DIAGNOSIS — E1140 Type 2 diabetes mellitus with diabetic neuropathy, unspecified: Secondary | ICD-10-CM

## 2014-03-07 NOTE — Telephone Encounter (Signed)
Last seen 02/03/14  FPW IF approved print and route to nurse

## 2014-03-07 NOTE — Telephone Encounter (Signed)
Pt called /walked in would like rx for fentayl patch  Call when ready to pick up

## 2014-03-08 MED ORDER — FENTANYL 50 MCG/HR TD PT72: 50.0000 ug | MEDICATED_PATCH | TRANSDERMAL | Status: DC

## 2014-03-08 NOTE — Telephone Encounter (Signed)
Rx ready for pick up. 

## 2014-03-18 ENCOUNTER — Encounter: Payer: Self-pay | Admitting: Family Medicine

## 2014-03-18 ENCOUNTER — Ambulatory Visit (INDEPENDENT_AMBULATORY_CARE_PROVIDER_SITE_OTHER): Payer: Medicare HMO | Admitting: Family Medicine

## 2014-03-18 VITALS — BP 99/59 | HR 70 | Temp 98.2°F | Ht 68.0 in | Wt 219.0 lb

## 2014-03-18 DIAGNOSIS — E1140 Type 2 diabetes mellitus with diabetic neuropathy, unspecified: Secondary | ICD-10-CM

## 2014-03-18 DIAGNOSIS — E11319 Type 2 diabetes mellitus with unspecified diabetic retinopathy without macular edema: Secondary | ICD-10-CM

## 2014-03-18 DIAGNOSIS — I2510 Atherosclerotic heart disease of native coronary artery without angina pectoris: Secondary | ICD-10-CM

## 2014-03-18 DIAGNOSIS — E1149 Type 2 diabetes mellitus with other diabetic neurological complication: Secondary | ICD-10-CM

## 2014-03-18 DIAGNOSIS — N289 Disorder of kidney and ureter, unspecified: Secondary | ICD-10-CM

## 2014-03-18 DIAGNOSIS — E785 Hyperlipidemia, unspecified: Secondary | ICD-10-CM

## 2014-03-18 DIAGNOSIS — E1142 Type 2 diabetes mellitus with diabetic polyneuropathy: Secondary | ICD-10-CM

## 2014-03-18 DIAGNOSIS — E1139 Type 2 diabetes mellitus with other diabetic ophthalmic complication: Secondary | ICD-10-CM

## 2014-03-18 DIAGNOSIS — I10 Essential (primary) hypertension: Secondary | ICD-10-CM

## 2014-03-18 DIAGNOSIS — R5381 Other malaise: Secondary | ICD-10-CM | POA: Insufficient documentation

## 2014-03-18 DIAGNOSIS — G4730 Sleep apnea, unspecified: Secondary | ICD-10-CM

## 2014-03-18 DIAGNOSIS — C61 Malignant neoplasm of prostate: Secondary | ICD-10-CM

## 2014-03-18 DIAGNOSIS — R5383 Other fatigue: Secondary | ICD-10-CM

## 2014-03-18 DIAGNOSIS — E119 Type 2 diabetes mellitus without complications: Secondary | ICD-10-CM

## 2014-03-18 LAB — POCT CBC
Granulocyte percent: 41.6 %G (ref 37–80)
HCT, POC: 40 % — AB (ref 43.5–53.7)
Hemoglobin: 12.7 g/dL — AB (ref 14.1–18.1)
Lymph, poc: 3.8 — AB (ref 0.6–3.4)
MCH, POC: 28.4 pg (ref 27–31.2)
MCHC: 31.7 g/dL — AB (ref 31.8–35.4)
MCV: 89.5 fL (ref 80–97)
MPV: 7 fL (ref 0–99.8)
POC Granulocyte: 2.8 (ref 2–6.9)
POC LYMPH PERCENT: 56 %L — AB (ref 10–50)
Platelet Count, POC: 158 10*3/uL (ref 142–424)
RBC: 4.5 M/uL — AB (ref 4.69–6.13)
RDW, POC: 14.5 %
WBC: 6.8 10*3/uL (ref 4.6–10.2)

## 2014-03-18 LAB — POCT GLYCOSYLATED HEMOGLOBIN (HGB A1C): Hemoglobin A1C: 6

## 2014-03-18 MED ORDER — TERAZOSIN HCL 2 MG PO CAPS: ORAL_CAPSULE | ORAL | Status: DC

## 2014-03-18 NOTE — Progress Notes (Signed)
Patient ID: [name], male   DOB: [birth year], 80 y.o.   MRN: [id number] SUBJECTIVE: CC: Chief Complaint  Patient presents with  . Follow-up    6 WEEK FOLLOW UP .  WANTS TO KNOW IF SHOULD HAVE HIP SURGERYSTATES THE "PATCH CAUSING HIM SIDE EFFECTS    HPI: Patient is here for follow up of Diabetes Mellitus/HTN/: Symptoms evaluated: Denies Nocturia ,Denies Urinary Frequency , denies Blurred vision ,deniesDizziness,denies.Dysuria,denies paresthesias, denies extremity pain or ulcers.Marland Kitchendenies chest pain. has had an annual eye exam. do check the feet. Does check CBGs. Average WLN:LGXQJJ but not taking.his sugar as frequently as he should Denies episodes of hypoglycemia. Does have an emergency hypoglycemic plan. admits toCompliance with medications. Denies Problems with medications.  Has spinal stenosis with chronic back pain. His pain is in the lumbar area and into th ebuttocks. He believes that if he does bilateral hip surgeries that this may cure his pain. He was just reading about things. He wanted to discuss this. He has been trying to wean off medications and to be more active.  The medications makes him fatigued. No chest pain. No SOB. Past Medical History  Diagnosis Date  . DVT of leg (deep venous thrombosis) 1999    RLE   . Hypertension   . Hiatal hernia   . Lumbar spondylosis   . Thyroid nodule   . Goiter     stable x many year - last endo exam 08/2010  . Sleep apnea   . Type 2 diabetes mellitus   . Chronic kidney disease   . Diabetic retinopathy     right eye   . Diabetic neuropathy   . Osteopenia   . CAD (coronary artery disease)     (Left main normal, LAD 30-40% mid stenosis, diagonal 30-40% stenosis, circumflex 30-40% stenosis, right coronary artery 30-40% stenosis. 2003.)  . Sleep apnea     CPAP  . GERD (gastroesophageal reflux disease)   . Diabetes mellitus   . Hyperlipidemia   . HTN (hypertension)   . Prostate cancer   . Chronic back pain   . DVT  (deep venous thrombosis)    Past Surgical History  Procedure Laterality Date  . Tonsilectomy, adenoidectomy, bilateral myringotomy and tubes    . Inguinal hernia repair      bilateral  . Knee arthroscopy      left   . Biopsy of right ear    . Cholecystectomy    . Cataract extraction, bilateral    . Tonsillectomy and adenoidectomy    . Appendectomy    . Inguinal hernia repair      Bilateral   History   Social History  . Marital Status: Divorced    Spouse Name: N/A    Number of Children: 71  . Years of Education: N/A   Occupational History  . Retired    Social History Main Topics  . Smoking status: Former Smoker    Types: Cigarettes, Pipe, Cigars    Quit date: 11/08/2003  . Smokeless tobacco: Former Systems developer    Quit date: 11/08/2003  . Alcohol Use: No  . Drug Use: No  . Sexual Activity: No   Other Topics Concern  . Not on file   Social History Narrative   ** Merged History Encounter **       Family History  Problem Relation Age of Onset  . Hyperlipidemia Mother   . Hypertension Mother   . Deep vein thrombosis Mother   . Prostate cancer Father   .  Colon cancer Sister   . Cancer Father 31  . Pulmonary embolism Mother   . Diabetes Mother    Current Outpatient Prescriptions on File Prior to Visit  Medication Sig Dispense Refill  . acetaminophen (TYLENOL) 500 MG tablet Take 1,500 mg by mouth at bedtime as needed. For pain      . amLODipine (NORVASC) 5 MG tablet Take 1 tablet (5 mg total) by mouth daily.  90 tablet  1  . aspirin (ASPIRIN CHILDRENS) 81 MG chewable tablet Chew 81 mg by mouth daily.        . Cholecalciferol (VITAMIN D3) 2000 UNITS capsule Take 2,000 Units by mouth 2 (two) times daily.       . citalopram (CELEXA) 20 MG tablet TAKE 1 TABLET DAILY  30 tablet  2  . docusate sodium (STOOL SOFTENER) 100 MG capsule Take 300 mg by mouth 2 (two) times daily.      . fentaNYL (DURAGESIC) 50 MCG/HR Place 1 patch (50 mcg total) onto the skin every 3 (three) days.   10 patch  0  . finasteride (PROSCAR) 5 MG tablet Take 1 tablet (5 mg total) by mouth daily.  90 tablet  1  . furosemide (LASIX) 40 MG tablet TAKE 1 TABLET DAILY  90 tablet  1  . glucose blood test strip Test Blood Sugar Bid.  Dx 250.00  100 each  2  . ketoconazole (NIZORAL) 2 % cream       . Lancets (ACCU-CHEK MULTICLIX) lancets Test bid, Dx 250.00  100 each  2  . losartan (COZAAR) 50 MG tablet TAKE 1 TABLET DAILY  90 tablet  1  . magnesium oxide (MAG-OX) 400 MG tablet Take 1,600 mg by mouth at bedtime. INCLUDES: Zinc 11m      . metFORMIN (GLUCOPHAGE-XR) 500 MG 24 hr tablet Take 1 tablet (500 mg total) by mouth daily with breakfast.  30 tablet  3  . nebivolol (BYSTOLIC) 5 MG tablet Take 1 tablet (5 mg total) by mouth daily.  30 tablet  2  . Omega-3 Fatty Acids (FISH OIL) 1200 MG CAPS Take 1 capsule by mouth 5 (five) times daily.      .Marland Kitchenomeprazole (PRILOSEC) 20 MG capsule Take 1 capsule (20 mg total) by mouth daily.  30 capsule  5  . oxyCODONE-acetaminophen (PERCOCET) 5-325 MG per tablet Take 1 tablet by mouth every 8 (eight) hours as needed for pain.  45 tablet  0  . rosuvastatin (CRESTOR) 10 MG tablet Take 5 mg by mouth daily.       .Marland Kitchentriamcinolone cream (KENALOG) 0.1 %       . vitamin B-12 (CYANOCOBALAMIN) 1000 MCG tablet Take 1,000 mcg by mouth daily.      . mupirocin ointment (BACTROBAN) 2 % Apply topically 2 (two) times daily.  30 g  1   No current facility-administered medications on file prior to visit.   Allergies  Allergen Reactions  . Actos [Pioglitazone]     Allergic reaction  . Bactrim   . Celebrex [Celecoxib]   . Doxycycline     Caused black spots in his eyes  . Flagyl [Metronidazole Hcl]   . Gabapentin   . Lyrica [Pregabalin]   . Lyrica [Pregabalin]     Increase appetite   . Morphine And Related   . Motrin [Ibuprofen]   . Nsaids   . Nsaids     Swelling    . Ultram [Tramadol Hcl]   . Ultram [Tramadol Hcl]  Itching / rash     . Vioxx [Rofecoxib]      Swelling    . Celebrex [Celecoxib] Rash   Immunization History  Administered Date(s) Administered  . Influenza Whole 10/30/2009  . Influenza-Unspecified 09/29/2013  . Pneumococcal Polysaccharide-23 02/28/1999   Prior to Admission medications   Medication Sig Start Date End Date Taking? Authorizing Provider  acetaminophen (TYLENOL) 500 MG tablet Take 1,500 mg by mouth at bedtime as needed. For pain   Yes Historical Provider, MD  amLODipine (NORVASC) 5 MG tablet Take 1 tablet (5 mg total) by mouth daily. 10/04/13  Yes Vernie Shanks, MD  aspirin (ASPIRIN CHILDRENS) 81 MG chewable tablet Chew 81 mg by mouth daily.     Yes Historical Provider, MD  Cholecalciferol (VITAMIN D3) 2000 UNITS capsule Take 2,000 Units by mouth 2 (two) times daily.    Yes Historical Provider, MD  citalopram (CELEXA) 20 MG tablet TAKE 1 TABLET DAILY   Yes Vernie Shanks, MD  docusate sodium (STOOL SOFTENER) 100 MG capsule Take 300 mg by mouth 2 (two) times daily.   Yes Historical Provider, MD  fentaNYL (DURAGESIC) 50 MCG/HR Place 1 patch (50 mcg total) onto the skin every 3 (three) days. 03/08/14  Yes Vernie Shanks, MD  finasteride (PROSCAR) 5 MG tablet Take 1 tablet (5 mg total) by mouth daily. 10/04/13  Yes Vernie Shanks, MD  furosemide (LASIX) 40 MG tablet TAKE 1 TABLET DAILY   Yes Vernie Shanks, MD  glucose blood test strip Test Blood Sugar Bid.  Dx 250.00 01/28/14  Yes Vernie Shanks, MD  ketoconazole (NIZORAL) 2 % cream  01/03/14  Yes Historical Provider, MD  Lancets (ACCU-CHEK MULTICLIX) lancets Test bid, Dx 250.00 01/28/14  Yes Vernie Shanks, MD  losartan (COZAAR) 50 MG tablet TAKE 1 TABLET DAILY 01/31/14  Yes Vernie Shanks, MD  magnesium oxide (MAG-OX) 400 MG tablet Take 1,600 mg by mouth at bedtime. INCLUDES: Zinc 35m   Yes Historical Provider, MD  metFORMIN (GLUCOPHAGE-XR) 500 MG 24 hr tablet Take 1 tablet (500 mg total) by mouth daily with breakfast. 02/14/14  Yes FVernie Shanks MD  nebivolol (BYSTOLIC) 5 MG  tablet Take 1 tablet (5 mg total) by mouth daily. 10/07/13  Yes Tammy Eckard, PHARMD  Omega-3 Fatty Acids (FISH OIL) 1200 MG CAPS Take 1 capsule by mouth 5 (five) times daily.   Yes Historical Provider, MD  omeprazole (PRILOSEC) 20 MG capsule Take 1 capsule (20 mg total) by mouth daily. 02/14/14  Yes FVernie Shanks MD  oxyCODONE-acetaminophen (PERCOCET) 5-325 MG per tablet Take 1 tablet by mouth every 8 (eight) hours as needed for pain. 11/01/13  Yes FVernie Shanks MD  rosuvastatin (CRESTOR) 10 MG tablet Take 5 mg by mouth daily.    Yes Historical Provider, MD  terazosin (HYTRIN) 10 MG capsule Take 10 mg by mouth at bedtime.     Yes Historical Provider, MD  terazosin (HYTRIN) 5 MG capsule TAKE 1 CAPSULE AT BEDTIME 11/22/13  Yes FVernie Shanks MD  triamcinolone cream (KENALOG) 0.1 %  01/17/14  Yes Historical Provider, MD  vitamin B-12 (CYANOCOBALAMIN) 1000 MCG tablet Take 1,000 mcg by mouth daily.   Yes Historical Provider, MD  doxycycline (VIBRA-TABS) 100 MG tablet Take 1 tablet (100 mg total) by mouth 2 (two) times daily. 12/20/13   FVernie Shanks MD  mupirocin ointment (BACTROBAN) 2 % Apply topically 2 (two) times daily. 12/13/13   Mary-Margaret MHassell Done FNP  ROS: As above in the HPI. All other systems are stable or negative.  OBJECTIVE: APPEARANCE:  Patient in no acute distress.The patient appeared well nourished and normally developed. Acyanotic. Waist: VITAL SIGNS:BP 99/59  Pulse 70  Temp(Src) 98.2 F (36.8 C) (Oral)  Ht '5\' 8"'  (1.727 m)  Wt 219 lb (99.338 kg)  BMI 33.31 kg/m2 Obese WM.   SKIN: warm and  Dry without overt rashes, tattoos and scars  HEAD and Neck: without JVD, Head and scalp: normal Eyes:No scleral icterus. Fundi normal, eye movements normal. Ears: Auricle normal, canal normal, Tympanic membranes normal, insufflation normal. Nose: normal Throat: normal Neck & thyroid: normal  CHEST & LUNGS: Chest wall: normal Lungs: Clear  CVS: Reveals the PMI to be  normally located. Regular rhythm, First and Second Heart sounds are normal,  absence of murmurs, rubs or gallops. Peripheral vasculature: Radial pulses: normal  ABDOMEN:  Appearance: obese Benign, no organomegaly, no masses, no Abdominal Aortic enlargement. No Guarding , no rebound. No Bruits. Bowel sounds: normal  RECTAL: N/A GU: N/A  EXTREMETIES: nonedematous.  MUSCULOSKELETAL:  Spine: reduced ROM. Pain to palpation of the lumbar vertebrae. Joints: hips, bilaterally Reduced ROM of hips with minimal pain. SLR positive.  NEUROLOGIC: oriented to time,place and person; nonfocal. Reflexes reduced bilaterally in the lower extremities. Cranial Nerves are normal.  ASSESSMENT:  Diabetes mellitus - Plan: POCT glycosylated hemoglobin (Hb A1C)  HTN (hypertension) - BP too low - Plan: CMP14+EGFR  Kidney disease - Plan: terazosin (HYTRIN) 2 MG capsule  Hyperlipidemia - Plan: CMP14+EGFR, NMR, lipoprofile  Diabetic retinopathy - Plan: POCT glycosylated hemoglobin (Hb A1C)  Diabetic neuropathy - Plan: POCT glycosylated hemoglobin (Hb A1C)  CAD (coronary artery disease)  Sleep apnea  Other malaise and fatigue - Plan: POCT CBC, TSH  Prostate cancer  PLAN: Again reinforced the need for dietary changes. Recommend physical therapy and keeping active and exercise in a chair. Reduce the hytrin because his BP is too low. Discussed with patient that his major problem is his spinal stenosis, and he had not wanted to consider a neurosurgeon consultation over a year ago. He is still reluctant yet was considering hip surgery. He will consider what to do next. In the meantime adjusting medications to correct his low BP.   Orders Placed This Encounter  Procedures  . CMP14+EGFR  . NMR, lipoprofile  . TSH  . POCT glycosylated hemoglobin (Hb A1C)  . POCT CBC   Meds ordered this encounter  Medications  . terazosin (HYTRIN) 2 MG capsule    Sig: TAKE 1 CAPSULE AT BEDTIME    Dispense:   30 capsule    Refill:  3   Medications Discontinued During This Encounter  Medication Reason  . terazosin (HYTRIN) 10 MG capsule Dose change  . doxycycline (VIBRA-TABS) 100 MG tablet Completed Course  . terazosin (HYTRIN) 5 MG capsule Reorder   Return in about 2 weeks (around 04/01/2014) for Recheck medical problems, recheck BP.and  Discuss if he will continue pT and reconsider neurosurgeon consult.  Sarahlynn Cisnero P. Jacelyn Grip, M.D.

## 2014-03-19 LAB — CMP14+EGFR
ALT: 13 IU/L (ref 0–44)
AST: 17 IU/L (ref 0–40)
Albumin/Globulin Ratio: 1.9 (ref 1.1–2.5)
Albumin: 4.3 g/dL (ref 3.5–4.7)
Alkaline Phosphatase: 59 IU/L (ref 39–117)
BUN/Creatinine Ratio: 15 (ref 10–22)
BUN: 22 mg/dL (ref 8–27)
CO2: 24 mmol/L (ref 18–29)
Calcium: 9.1 mg/dL (ref 8.6–10.2)
Chloride: 99 mmol/L (ref 97–108)
Creatinine, Ser: 1.44 mg/dL — ABNORMAL HIGH (ref 0.76–1.27)
GFR calc Af Amer: 52 mL/min/{1.73_m2} — ABNORMAL LOW (ref >59)
GFR calc non Af Amer: 45 mL/min/{1.73_m2} — ABNORMAL LOW (ref >59)
Globulin, Total: 2.3 g/dL (ref 1.5–4.5)
Glucose: 123 mg/dL — ABNORMAL HIGH (ref 65–99)
Potassium: 4.4 mmol/L (ref 3.5–5.2)
Sodium: 142 mmol/L (ref 134–144)
Total Bilirubin: 0.4 mg/dL (ref 0.0–1.2)
Total Protein: 6.6 g/dL (ref 6.0–8.5)

## 2014-03-19 LAB — TSH: TSH: 0.522 u[IU]/mL (ref 0.450–4.500)

## 2014-03-19 LAB — NMR, LIPOPROFILE
Cholesterol: 152 mg/dL (ref <200)
HDL Cholesterol by NMR: 59 mg/dL (ref >=40)
HDL Particle Number: 36.2 umol/L (ref >=30.5)
LDL Particle Number: 1128 nmol/L — ABNORMAL HIGH (ref <1000)
LDL Size: 21 nm (ref >20.5)
LDLC SERPL CALC-MCNC: 72 mg/dL (ref <100)
LP-IR Score: 38 (ref <=45)
Small LDL Particle Number: 112 nmol/L (ref <=527)
Triglycerides by NMR: 106 mg/dL (ref <150)

## 2014-03-21 ENCOUNTER — Telehealth: Payer: Self-pay | Admitting: Family Medicine

## 2014-03-21 NOTE — Telephone Encounter (Signed)
Message copied by Waverly Ferrari on Mon Mar 21, 2014  4:21 PM ------      Message from: Vernie Shanks      Created: Sat Mar 19, 2014  8:56 PM       Call Patient      Lab result at or close to goal.      No change in Medications for now.      No Change in recommendations.      No change in plans for follow up. ------

## 2014-04-04 ENCOUNTER — Telehealth: Payer: Self-pay | Admitting: Family Medicine

## 2014-04-04 ENCOUNTER — Ambulatory Visit: Payer: Medicare HMO | Admitting: Family Medicine

## 2014-04-07 NOTE — Telephone Encounter (Signed)
SPOKE WITH PT AND HE VERBALIZES HE HAS ENOUGH PATCHES TIL OV ON 04-11-14

## 2014-04-11 ENCOUNTER — Encounter: Payer: Self-pay | Admitting: Family Medicine

## 2014-04-11 ENCOUNTER — Ambulatory Visit (INDEPENDENT_AMBULATORY_CARE_PROVIDER_SITE_OTHER): Payer: Medicare HMO | Admitting: Family Medicine

## 2014-04-11 VITALS — BP 134/81 | HR 82 | Temp 99.2°F | Ht 68.0 in | Wt 220.6 lb

## 2014-04-11 DIAGNOSIS — E1139 Type 2 diabetes mellitus with other diabetic ophthalmic complication: Secondary | ICD-10-CM

## 2014-04-11 DIAGNOSIS — E1142 Type 2 diabetes mellitus with diabetic polyneuropathy: Secondary | ICD-10-CM

## 2014-04-11 DIAGNOSIS — M4310 Spondylolisthesis, site unspecified: Secondary | ICD-10-CM

## 2014-04-11 DIAGNOSIS — E785 Hyperlipidemia, unspecified: Secondary | ICD-10-CM

## 2014-04-11 DIAGNOSIS — M549 Dorsalgia, unspecified: Secondary | ICD-10-CM

## 2014-04-11 DIAGNOSIS — I82409 Acute embolism and thrombosis of unspecified deep veins of unspecified lower extremity: Secondary | ICD-10-CM

## 2014-04-11 DIAGNOSIS — G8929 Other chronic pain: Secondary | ICD-10-CM | POA: Insufficient documentation

## 2014-04-11 DIAGNOSIS — N289 Disorder of kidney and ureter, unspecified: Secondary | ICD-10-CM

## 2014-04-11 DIAGNOSIS — M4306 Spondylolysis, lumbar region: Secondary | ICD-10-CM

## 2014-04-11 DIAGNOSIS — C61 Malignant neoplasm of prostate: Secondary | ICD-10-CM

## 2014-04-11 DIAGNOSIS — M8580 Other specified disorders of bone density and structure, unspecified site: Secondary | ICD-10-CM | POA: Insufficient documentation

## 2014-04-11 DIAGNOSIS — E1140 Type 2 diabetes mellitus with diabetic neuropathy, unspecified: Secondary | ICD-10-CM

## 2014-04-11 DIAGNOSIS — I2510 Atherosclerotic heart disease of native coronary artery without angina pectoris: Secondary | ICD-10-CM

## 2014-04-11 DIAGNOSIS — E1149 Type 2 diabetes mellitus with other diabetic neurological complication: Secondary | ICD-10-CM

## 2014-04-11 DIAGNOSIS — I10 Essential (primary) hypertension: Secondary | ICD-10-CM

## 2014-04-11 DIAGNOSIS — E11319 Type 2 diabetes mellitus with unspecified diabetic retinopathy without macular edema: Secondary | ICD-10-CM

## 2014-04-11 MED ORDER — FENTANYL 50 MCG/HR TD PT72: 50.0000 ug | MEDICATED_PATCH | TRANSDERMAL | Status: DC

## 2014-04-11 NOTE — Progress Notes (Signed)
Patient ID: [name], male   DOB: 04-May-1930, 80 y.o.   MRN: [id number] SUBJECTIVE: CC: Chief Complaint  Patient presents with  . Follow-up    1 month follow up  chronic problems reck BP . wants refill fentanyl patch    HPI: Patient is here for follow up of hypertension/DM/CAD/Prostate cancer.: denies Headache;deniesChest Pain;denies weakness;denies Shortness of Breath or Orthopnea;denies Visual changes;denies palpitations;denies cough;denies pedal edema;denies symptoms of TIA or stroke; admits to Compliance with medications. denies Problems with medications.  Chronic back pain stable. He has a walker but prefers to use a cane.  Past Medical History  Diagnosis Date  . DVT of leg (deep venous thrombosis) 1999    RLE   . Hypertension   . Hiatal hernia   . Thyroid nodule   . Goiter     stable x many year - last endo exam 08/2010  . Sleep apnea   . Type 2 diabetes mellitus   . Chronic kidney disease   . Diabetic retinopathy     right eye   . Diabetic neuropathy   . Osteopenia   . CAD (coronary artery disease)     (Left main normal, LAD 30-40% mid stenosis, diagonal 30-40% stenosis, circumflex 30-40% stenosis, right coronary artery 30-40% stenosis. 2003.)  . Sleep apnea     CPAP  . GERD (gastroesophageal reflux disease)   . Diabetes mellitus   . Hyperlipidemia   . HTN (hypertension)   . Prostate cancer   . Chronic back pain   . DVT (deep venous thrombosis)   . Lumbar spondylosis    Past Surgical History  Procedure Laterality Date  . Tonsilectomy, adenoidectomy, bilateral myringotomy and tubes    . Inguinal hernia repair      bilateral  . Knee arthroscopy      left   . Biopsy of right ear    . Cholecystectomy    . Cataract extraction, bilateral    . Tonsillectomy and adenoidectomy    . Appendectomy    . Inguinal hernia repair      Bilateral   History   Social History  . Marital Status: Divorced    Spouse Name: N/A    Number of Children: 45  . Years  of Education: N/A   Occupational History  . Retired    Social History Main Topics  . Smoking status: Former Smoker    Types: Cigarettes, Pipe, Cigars    Quit date: 11/08/2003  . Smokeless tobacco: Former Systems developer    Quit date: 11/08/2003  . Alcohol Use: No  . Drug Use: No  . Sexual Activity: No   Other Topics Concern  . Not on file   Social History Narrative   ** Merged History Encounter **       Family History  Problem Relation Age of Onset  . Hyperlipidemia Mother   . Hypertension Mother   . Deep vein thrombosis Mother   . Prostate cancer Father   . Colon cancer Sister   . Cancer Father 78  . Pulmonary embolism Mother   . Diabetes Mother    Current Outpatient Prescriptions on File Prior to Visit  Medication Sig Dispense Refill  . acetaminophen (TYLENOL) 500 MG tablet Take 1,500 mg by mouth at bedtime as needed. For pain      . amLODipine (NORVASC) 5 MG tablet Take 1 tablet (5 mg total) by mouth daily.  90 tablet  1  . aspirin (ASPIRIN CHILDRENS) 81 MG chewable tablet Chew 81 mg by  mouth daily.        . Cholecalciferol (VITAMIN D3) 2000 UNITS capsule Take 2,000 Units by mouth 2 (two) times daily.       . citalopram (CELEXA) 20 MG tablet TAKE 1 TABLET DAILY  30 tablet  2  . docusate sodium (STOOL SOFTENER) 100 MG capsule Take 300 mg by mouth 2 (two) times daily.      . finasteride (PROSCAR) 5 MG tablet Take 1 tablet (5 mg total) by mouth daily.  90 tablet  1  . furosemide (LASIX) 40 MG tablet TAKE 1 TABLET DAILY  90 tablet  1  . glucose blood test strip Test Blood Sugar Bid.  Dx 250.00  100 each  2  . ketoconazole (NIZORAL) 2 % cream       . Lancets (ACCU-CHEK MULTICLIX) lancets Test bid, Dx 250.00  100 each  2  . losartan (COZAAR) 50 MG tablet TAKE 1 TABLET DAILY  90 tablet  1  . magnesium oxide (MAG-OX) 400 MG tablet Take 1,600 mg by mouth at bedtime. INCLUDES: Zinc 26m      . metFORMIN (GLUCOPHAGE-XR) 500 MG 24 hr tablet Take 1 tablet (500 mg total) by mouth daily with  breakfast.  30 tablet  3  . mupirocin ointment (BACTROBAN) 2 % Apply topically 2 (two) times daily.  30 g  1  . nebivolol (BYSTOLIC) 5 MG tablet Take 1 tablet (5 mg total) by mouth daily.  30 tablet  2  . Omega-3 Fatty Acids (FISH OIL) 1200 MG CAPS Take 1 capsule by mouth 5 (five) times daily.      .Marland Kitchenomeprazole (PRILOSEC) 20 MG capsule Take 1 capsule (20 mg total) by mouth daily.  30 capsule  5  . oxyCODONE-acetaminophen (PERCOCET) 5-325 MG per tablet Take 1 tablet by mouth every 8 (eight) hours as needed for pain.  45 tablet  0  . rosuvastatin (CRESTOR) 10 MG tablet Take 5 mg by mouth daily.       .Marland Kitchenterazosin (HYTRIN) 2 MG capsule TAKE 1 CAPSULE AT BEDTIME  30 capsule  3  . triamcinolone cream (KENALOG) 0.1 %       . vitamin B-12 (CYANOCOBALAMIN) 1000 MCG tablet Take 1,000 mcg by mouth daily.       No current facility-administered medications on file prior to visit.   Allergies  Allergen Reactions  . Actos [Pioglitazone]     Allergic reaction  . Bactrim   . Celebrex [Celecoxib]   . Doxycycline     Caused black spots in his eyes  . Flagyl [Metronidazole Hcl]   . Gabapentin   . Lyrica [Pregabalin]   . Lyrica [Pregabalin]     Increase appetite   . Morphine And Related   . Motrin [Ibuprofen]   . Nsaids   . Nsaids     Swelling    . Ultram [Tramadol Hcl]   . Ultram [Tramadol Hcl]     Itching / rash     . Vioxx [Rofecoxib]     Swelling    . Celebrex [Celecoxib] Rash   Immunization History  Administered Date(s) Administered  . Influenza Whole 10/30/2009  . Influenza-Unspecified 09/29/2013  . Pneumococcal Polysaccharide-23 02/28/1999   Prior to Admission medications   Medication Sig Start Date End Date Taking? Authorizing Provider  acetaminophen (TYLENOL) 500 MG tablet Take 1,500 mg by mouth at bedtime as needed. For pain   Yes Historical Provider, MD  amLODipine (NORVASC) 5 MG tablet Take 1 tablet (5 mg total) by  mouth daily. 10/04/13  Yes Vernie Shanks, MD  aspirin  (ASPIRIN CHILDRENS) 81 MG chewable tablet Chew 81 mg by mouth daily.     Yes Historical Provider, MD  Cholecalciferol (VITAMIN D3) 2000 UNITS capsule Take 2,000 Units by mouth 2 (two) times daily.    Yes Historical Provider, MD  citalopram (CELEXA) 20 MG tablet TAKE 1 TABLET DAILY   Yes Vernie Shanks, MD  docusate sodium (STOOL SOFTENER) 100 MG capsule Take 300 mg by mouth 2 (two) times daily.   Yes Historical Provider, MD  fentaNYL (DURAGESIC) 50 MCG/HR Place 1 patch (50 mcg total) onto the skin every 3 (three) days. 03/08/14  Yes Vernie Shanks, MD  finasteride (PROSCAR) 5 MG tablet Take 1 tablet (5 mg total) by mouth daily. 10/04/13  Yes Vernie Shanks, MD  furosemide (LASIX) 40 MG tablet TAKE 1 TABLET DAILY   Yes Vernie Shanks, MD  glucose blood test strip Test Blood Sugar Bid.  Dx 250.00 01/28/14  Yes Vernie Shanks, MD  ketoconazole (NIZORAL) 2 % cream  01/03/14  Yes Historical Provider, MD  Lancets (ACCU-CHEK MULTICLIX) lancets Test bid, Dx 250.00 01/28/14  Yes Vernie Shanks, MD  losartan (COZAAR) 50 MG tablet TAKE 1 TABLET DAILY 01/31/14  Yes Vernie Shanks, MD  magnesium oxide (MAG-OX) 400 MG tablet Take 1,600 mg by mouth at bedtime. INCLUDES: Zinc 72m   Yes Historical Provider, MD  metFORMIN (GLUCOPHAGE-XR) 500 MG 24 hr tablet Take 1 tablet (500 mg total) by mouth daily with breakfast. 02/14/14  Yes FVernie Shanks MD  mupirocin ointment (BACTROBAN) 2 % Apply topically 2 (two) times daily. 12/13/13  Yes Mary-Margaret MHassell Done FNP  nebivolol (BYSTOLIC) 5 MG tablet Take 1 tablet (5 mg total) by mouth daily. 10/07/13  Yes Tammy Eckard, PHARMD  Omega-3 Fatty Acids (FISH OIL) 1200 MG CAPS Take 1 capsule by mouth 5 (five) times daily.   Yes Historical Provider, MD  omeprazole (PRILOSEC) 20 MG capsule Take 1 capsule (20 mg total) by mouth daily. 02/14/14  Yes FVernie Shanks MD  oxyCODONE-acetaminophen (PERCOCET) 5-325 MG per tablet Take 1 tablet by mouth every 8 (eight) hours as needed for pain. 11/01/13   Yes FVernie Shanks MD  rosuvastatin (CRESTOR) 10 MG tablet Take 5 mg by mouth daily.    Yes Historical Provider, MD  terazosin (HYTRIN) 2 MG capsule TAKE 1 CAPSULE AT BEDTIME 03/18/14  Yes FVernie Shanks MD  triamcinolone cream (KENALOG) 0.1 %  01/17/14  Yes Historical Provider, MD  vitamin B-12 (CYANOCOBALAMIN) 1000 MCG tablet Take 1,000 mcg by mouth daily.   Yes Historical Provider, MD     ROS: As above in the HPI. All other systems are stable or negative.  OBJECTIVE: APPEARANCE:  Patient in no acute distress.The patient appeared well nourished and normally developed. Acyanotic. Waist: VITAL SIGNS:BP 134/81  Pulse 82  Temp(Src) 99.2 F (37.3 C) (Oral)  Ht '5\' 8"'  (1.727 m)  Wt 220 lb 9.6 oz (100.064 kg)  BMI 33.55 kg/m2  Elderly WM  SKIN: warm and  Dry without overt rashes, tattoos and scars  HEAD and Neck: without JVD, Head and scalp: normal Eyes:No scleral icterus. Fundi normal, eye movements normal. Ears: Auricle normal, canal normal, Tympanic membranes normal, insufflation normal. Nose: normal Throat: normal Neck & thyroid: normal  CHEST & LUNGS: Chest wall: normal Lungs: Clear  CVS: Reveals the PMI to be normally located. Regular rhythm, First and Second Heart sounds are normal,  absence of murmurs, rubs or gallops. Peripheral vasculature: Radial pulses: normal Dorsal pedis pulses: normal Posterior pulses: normal  ABDOMEN:  Appearance: normal Benign, no organomegaly, no masses, no Abdominal Aortic enlargement. No Guarding , no rebound. No Bruits. Bowel sounds: normal  RECTAL: N/A GU: N/A  EXTREMETIES: nonedematous.  MUSCULOSKELETAL:  Spine: reduced ROM Joints: hips reduced ROM.  NEUROLOGIC: oriented to time,place and person; nonfocal. Cranial Nerves are normal. Results for orders placed in visit on 03/18/14  [id number]+EGFR      Result Value Ref Range   Glucose 123 (*) 65 - 99 mg/dL   BUN 22  8 - 27 mg/dL   Creatinine, Ser 1.44 (*) 0.76 - 1.27  mg/dL   GFR calc non Af Amer 45 (*) >59 mL/min/1.73   GFR calc Af Amer 52 (*) >59 mL/min/1.73   BUN/Creatinine Ratio 15  10 - 22   Sodium 142  134 - 144 mmol/L   Potassium 4.4  3.5 - 5.2 mmol/L   Chloride 99  97 - 108 mmol/L   CO2 24  18 - 29 mmol/L   Calcium 9.1  8.6 - 10.2 mg/dL   Total Protein 6.6  6.0 - 8.5 g/dL   Albumin 4.3  3.5 - 4.7 g/dL   Globulin, Total 2.3  1.5 - 4.5 g/dL   Albumin/Globulin Ratio 1.9  1.1 - 2.5   Total Bilirubin 0.4  0.0 - 1.2 mg/dL   Alkaline Phosphatase 59  39 - 117 IU/L   AST 17  0 - 40 IU/L   ALT 13  0 - 44 IU/L  NMR, LIPOPROFILE      Result Value Ref Range   LDL Particle Number [id number] (*) <1000 nmol/L   LDLC SERPL CALC-MCNC 72  <100 mg/dL   HDL Cholesterol by NMR 59  >=40 mg/dL   Triglycerides by NMR 106  <150 mg/dL   Cholesterol 152  <200 mg/dL   HDL Particle Number 36.2  >=30.5 umol/L   Small LDL Particle Number 112  <=527 nmol/L   LDL Size 21.0  >20.5 nm   LP-IR Score 38  <=45  TSH      Result Value Ref Range   TSH 0.522  0.450 - 4.500 uIU/mL  POCT GLYCOSYLATED HEMOGLOBIN (HGB A1C)      Result Value Ref Range   Hemoglobin A1C 6.0    POCT CBC      Result Value Ref Range   WBC 6.8  4.6 - 10.2 K/uL   Lymph, poc 3.8 (*) 0.6 - 3.4   POC LYMPH PERCENT 56.0 (*) 10 - 50 %L   POC Granulocyte 2.8  2 - 6.9   Granulocyte percent 41.6  37 - 80 %G   RBC 4.5 (*) 4.69 - 6.13 M/uL   Hemoglobin 12.7 (*) 14.1 - 18.1 g/dL   HCT, POC 40.0 (*) 43.5 - 53.7 %   MCV 89.5  80 - 97 fL   MCH, POC 28.4  27 - 31.2 pg   MCHC 31.7 (*) 31.8 - 35.4 g/dL   RDW, POC 14.5     Platelet Count, POC 158.0  142 - 424 K/uL   MPV 7.0  0 - 99.8 fL    ASSESSMENT: HTN (hypertension)  Hyperlipidemia  Diabetic retinopathy  Diabetic neuropathy - Plan: fentaNYL (DURAGESIC) 50 MCG/HR  CAD (coronary artery disease)  Lumbar spondylolysis  Prostate cancer  DVT (deep venous thrombosis)  Kidney disease  Chronic back pain BP better.  PLAN:  Keep active. Healthy  diet Reviewed  his labs with him  Needs to use his walker which is more stable.  No orders of the defined types were placed in this encounter.   Meds ordered this encounter  Medications  . fentaNYL (DURAGESIC) 50 MCG/HR    Sig: Place 1 patch (50 mcg total) onto the skin every 3 (three) days.    Dispense:  10 patch    Refill:  0   Medications Discontinued During This Encounter  Medication Reason  . fentaNYL (DURAGESIC) 50 MCG/HR Reorder   Return in about 3 months (around 07/11/2014) for Recheck medical problems.  Lorenza Shakir P. Jacelyn Grip, M.D.

## 2014-05-26 ENCOUNTER — Other Ambulatory Visit: Payer: Self-pay | Admitting: *Deleted

## 2014-05-26 ENCOUNTER — Other Ambulatory Visit: Payer: Self-pay

## 2014-05-26 DIAGNOSIS — E1140 Type 2 diabetes mellitus with diabetic neuropathy, unspecified: Secondary | ICD-10-CM

## 2014-05-26 MED ORDER — CITALOPRAM HYDROBROMIDE 20 MG PO TABS: ORAL_TABLET | ORAL | Status: DC

## 2014-05-26 MED ORDER — FENTANYL 50 MCG/HR TD PT72: 50.0000 ug | MEDICATED_PATCH | TRANSDERMAL | Status: DC

## 2014-06-06 ENCOUNTER — Telehealth: Payer: Self-pay | Admitting: Family Medicine

## 2014-06-06 MED ORDER — OXYCODONE-ACETAMINOPHEN 5-325 MG PO TABS: 1.0000 | ORAL_TABLET | Freq: Three times a day (TID) | ORAL | Status: DC | PRN

## 2014-06-06 NOTE — Telephone Encounter (Signed)
Not filled since 10/2013, last saw [name] 03/2014. He is also on patches

## 2014-06-07 NOTE — Telephone Encounter (Signed)
Pt aware.

## 2014-06-14 ENCOUNTER — Other Ambulatory Visit: Payer: Self-pay | Admitting: Pharmacist Clinician (PhC)/ Clinical Pharmacy Specialist

## 2014-06-16 ENCOUNTER — Other Ambulatory Visit: Payer: Self-pay | Admitting: *Deleted

## 2014-06-16 MED ORDER — AMLODIPINE BESYLATE 5 MG PO TABS: 5.0000 mg | ORAL_TABLET | Freq: Every day | ORAL | Status: DC

## 2014-06-16 MED ORDER — FINASTERIDE 5 MG PO TABS: 5.0000 mg | ORAL_TABLET | Freq: Every day | ORAL | Status: DC

## 2014-06-23 ENCOUNTER — Encounter: Payer: Self-pay | Admitting: Nurse Practitioner

## 2014-06-23 ENCOUNTER — Ambulatory Visit (INDEPENDENT_AMBULATORY_CARE_PROVIDER_SITE_OTHER): Payer: Medicare HMO | Admitting: Nurse Practitioner

## 2014-06-23 ENCOUNTER — Other Ambulatory Visit: Payer: Self-pay | Admitting: Nurse Practitioner

## 2014-06-23 ENCOUNTER — Ambulatory Visit (HOSPITAL_COMMUNITY)
Admission: RE | Admit: 2014-06-23 | Discharge: 2014-06-23 | Disposition: A | Discharge status: Home / Self Care | Payer: Medicare PPO | Source: Ambulatory Visit | Attending: Nurse Practitioner | Admitting: Nurse Practitioner

## 2014-06-23 VITALS — BP 130/60 | HR 71 | Temp 98.3°F | Ht 68.0 in | Wt 223.0 lb

## 2014-06-23 DIAGNOSIS — M79662 Pain in left lower leg: Secondary | ICD-10-CM

## 2014-06-23 DIAGNOSIS — M7989 Other specified soft tissue disorders: Secondary | ICD-10-CM

## 2014-06-23 DIAGNOSIS — M79609 Pain in unspecified limb: Secondary | ICD-10-CM | POA: Insufficient documentation

## 2014-06-23 DIAGNOSIS — M25562 Pain in left knee: Secondary | ICD-10-CM

## 2014-06-23 DIAGNOSIS — R609 Edema, unspecified: Secondary | ICD-10-CM | POA: Insufficient documentation

## 2014-06-23 DIAGNOSIS — Z86718 Personal history of other venous thrombosis and embolism: Secondary | ICD-10-CM

## 2014-06-23 DIAGNOSIS — M25569 Pain in unspecified knee: Secondary | ICD-10-CM

## 2014-06-23 NOTE — Progress Notes (Signed)
   Subjective:    Patient ID: [name], male    DOB: February 28, 1930, 80 y.o.   MRN: [id number]  Leg Pain  The incident occurred more than 1 week ago. The pain is present in the left leg. The quality of the pain is described as aching. The pain is at a severity of 4/10. The pain is mild. The pain has been Kelen since onset. Pertinent negatives include no inability to bear weight. He reports no foreign bodies present. He has tried nothing for the symptoms.      Review of Systems  Constitutional: Negative.   HENT: Negative.   Eyes: Negative.   Respiratory: Negative.   Cardiovascular: Negative.   Gastrointestinal: Negative.   Endocrine: Negative.   Genitourinary: Negative.   Musculoskeletal: Positive for back pain, gait problem and neck pain.  Skin: Negative.   Neurological: Negative.        Objective:   Physical Exam  Constitutional: He appears well-developed.  HENT:  Head: Normocephalic.  Neck: Normal range of motion.  Cardiovascular: Normal rate and regular rhythm.   Varicose vein left mid shin area  Musculoskeletal:       Left knee: Tenderness found.  Uses a walker.  Positive homan's sign.  Increase pain with dorsiflexion.   Skin: Skin is warm.    BP 130/60  Pulse 71  Temp(Src) 98.3 F (36.8 C) (Oral)  Ht 5\' 8"  (1.727 m)  Wt 223 lb (101.152 kg)  BMI 33.91 kg/m2       Assessment & Plan:   1. Pain in joint, lower leg, left   2. History of DVT (deep vein thrombosis)    Orders Placed This Encounter  Procedures  . Lower Extremity Arterial Doppler Left    Standing Status: Future     Number of Occurrences:      Standing Expiration Date: 06/24/2015    Order Specific Question:  Laterality    Answer:  Left    Order Specific Question:  Where should this test be performed:    Answer:  [facility]   Continue low dose ASA daily Will talk after doppler completed  Mary-Margaret Hassell Done, FNP

## 2014-06-23 NOTE — Patient Instructions (Signed)
Deep Vein Thrombosis °A deep vein thrombosis (DVT) is a blood clot that develops in the deep, larger veins of the leg, arm, or pelvis. These are more dangerous than clots that might form in veins near the surface of the body. A DVT can lead to complications if the clot breaks off and travels in the bloodstream to the lungs.  °A DVT can damage the valves in your leg veins, so that instead of flowing upward, the blood pools in the lower leg. This is called post-thrombotic syndrome, and it can result in pain, swelling, discoloration, and sores on the leg. °CAUSES °Usually, several things contribute to blood clots forming. Contributing factors include: °· The flow of blood slows down. °· The inside of the vein is damaged in some way. °· You have a condition that makes blood clot more easily. °RISK FACTORS °Some people are more likely than others to develop blood clots. Risk factors include:  °· Older age, especially over 75 years of age. °· Having a family history of blood clots or if you have already had a blot clot. °· Having major or lengthy surgery. This is especially true for surgery on the hip, knee, or belly (abdomen). Hip surgery is particularly high risk. °· Breaking a hip or leg. °· Sitting or lying still for a long time. This includes long-distance travel, paralysis, or recovery from an illness or surgery. °· Having cancer or cancer treatment. °· Having a long, thin tube (catheter) placed inside a vein during a medical procedure. °· Being overweight (obese). °· Pregnancy and childbirth. °¨ Hormone changes make the blood clot more easily during pregnancy. °¨ The fetus puts pressure on the veins of the pelvis. °¨ There is a risk of injury to veins during delivery or a caesarean. The risk is highest just after childbirth. °· Medicines with the male hormone estrogen. This includes birth control pills and hormone replacement therapy. °· Smoking. °· Other circulation or heart problems. ° °SIGNS AND SYMPTOMS °When  a clot forms, it can either partially or totally block the blood flow in that vein. Symptoms of a DVT can include: °· Swelling of the leg or arm, especially if one side is much worse. °· Warmth and redness of the leg or arm, especially if one side is much worse. °· Pain in an arm or leg. If the clot is in the leg, symptoms may be more noticeable or worse when standing or walking. °The symptoms of a DVT that has traveled to the lungs (pulmonary embolism, PE) usually start suddenly and include: °· Shortness of breath. °· Coughing. °· Coughing up blood or blood-tinged phlegm. °· Chest pain. The chest pain is often worse with deep breaths. °· Rapid heartbeat. °Anyone with these symptoms should get emergency medical treatment right away. Call your local emergency services (911 in the [place] if you have these symptoms. °DIAGNOSIS °If a DVT is suspected, your health care provider will take a full medical history and perform a physical exam. Tests that also may be required include: °· Blood tests, including studies of the clotting properties of the blood. °· Ultrasonography to see if you have clots in your legs or lungs. °· X-rays to show the flow of blood when dye is injected into the veins (venography). °· Studies of your lungs if you have any chest symptoms. °PREVENTION °· Exercise the legs regularly. Take a brisk 30-minute walk every day. °· Maintain a weight that is appropriate for your height. °· Avoid sitting or lying in bed   for long periods of time without moving your legs. °· Women, particularly those over the age of 35 years, should consider the risks and benefits of taking estrogen medicines, including birth control pills. °· Do not smoke, especially if you take estrogen medicines. °· Long-distance travel can increase your risk of DVT. You should exercise your legs by walking or pumping the muscles every hour. °· In-hospital prevention: °¨ Many of the risk factors above relate to situations that exist with  hospitalization, either for illness, injury, or elective surgery. °¨ Your health care provider will assess you for the need for venous thromboembolism prophylaxis when you are admitted to the hospital. If you are having surgery, your surgeon will assess you the day of or day after surgery. °¨ Prevention may include medical and nonmedical measures. °TREATMENT °Once identified, a DVT can be treated. It can also be prevented in some circumstances. Once you have had a DVT, you may be at increased risk for a DVT in the future. The most common treatment for DVT is blood thinning (anticoagulant) medicine, which reduces the blood's tendency to clot. Anticoagulants can stop new blood clots from forming and stop old ones from growing. They cannot dissolve existing clots. Your body does this by itself over time. Anticoagulants can be given by mouth, by IV access, or by injection. Your health care provider will determine the best program for you. Other medicines or treatments that may be used are: °· Heparin or related medicines (low molecular weight heparin) are usually the first treatment for a blood clot. They act quickly. However, they cannot be taken orally. °¨ Heparin can cause a fall in a component of blood that stops bleeding and forms blood clots (platelets). You will be monitored with blood tests to be sure this does not occur. °· Warfarin is an anticoagulant that can be swallowed. It takes a few days to start working, so usually heparin or related medicines are used in combination. Once warfarin is working, heparin is usually stopped. °· Less commonly, clot dissolving drugs (thrombolytics) are used to dissolve a DVT. They carry a high risk of bleeding, so they are used mainly in severe cases, where your life or a limb is threatened. °· Very rarely, a blood clot in the leg needs to be removed surgically. °· If you are unable to take anticoagulants, your health care provider may arrange for you to have a filter placed  in a main vein in your abdomen. This filter prevents clots from traveling to your lungs. °HOME CARE INSTRUCTIONS °· Take all medicines prescribed by your health care provider. Only take over-the-counter or prescription medicines for pain, fever, or discomfort as directed by your health care provider. °· Warfarin. Most people will continue taking warfarin after hospital discharge. Your health care provider will advise you on the length of treatment (usually 3-6 months, sometimes lifelong). °¨ Too much and too little warfarin are both dangerous. Too much warfarin increases the risk of bleeding. Too little warfarin continues to allow the risk for blood clots. While taking warfarin, you will need to have regular blood tests to measure your blood clotting time. These blood tests usually include both the prothrombin time (PT) and international normalized ratio (INR) tests. The PT and INR results allow your health care provider to adjust your dose of warfarin. The dose can change for many reasons. It is critically important that you take warfarin exactly as prescribed, and that you have your PT and INR levels drawn exactly as directed. °¨   Many foods, especially foods high in vitamin K, can interfere with warfarin and affect the PT and INR results. Foods high in vitamin K include spinach, kale, broccoli, cabbage, collard and turnip greens, brussel sprouts, peas, cauliflower, seaweed, and parsley as well as beef and pork liver, green tea, and soybean oil. You should eat a consistent amount of foods high in vitamin K. Avoid major changes in your diet, or notify your health care provider before changing your diet. Arrange a visit with a dietitian to answer your questions. °¨ Many medicines can interfere with warfarin and affect the PT and INR results. You must tell your health care provider about any and all medicines you take. This includes all vitamins and supplements. Be especially cautious with aspirin and  anti-inflammatory medicines. Ask your health care provider before taking these. Do not take or discontinue any prescribed or over-the-counter medicine except on the advice of your health care provider or pharmacist. °¨ Warfarin can have side effects, primarily excessive bruising or bleeding. You will need to hold pressure over cuts for longer than usual. Your health care provider or pharmacist will discuss other potential side effects. °¨ Alcohol can change the body's ability to handle warfarin. It is best to avoid alcoholic drinks or consume only very small amounts while taking warfarin. Notify your health care provider if you change your alcohol intake. °¨ Notify your dentist or other health care providers before procedures. °· Activity. Ask your health care provider how soon you can go back to normal activities. It is important to stay active to prevent blood clots. If you are on anticoagulant medicine, avoid contact sports. °· Exercise. It is very important to exercise. This is especially important while traveling, sitting, or standing for long periods of time. Exercise your legs by walking or by pumping the muscles frequently. Take frequent walks. °· Compression stockings. These are tight elastic stockings that apply pressure to the lower legs. This pressure can help keep the blood in the legs from clotting. You may need to wear compression stockings at home to help prevent a DVT. °· Do not smoke. If you smoke, quit. Ask your health care provider for help with quitting smoking. °· Learn as much as you can about DVT. Knowing more about the condition should help you keep it from coming back. °· Wear a medical alert bracelet or carry a medical alert card. °SEEK MEDICAL CARE IF: °· You notice a rapid heartbeat. °· You feel weaker or more tired than usual. °· You feel faint. °· You notice increased bruising. °· You feel your symptoms are not getting better in the time expected. °· You believe you are having side  effects of medicine. °SEEK IMMEDIATE MEDICAL CARE IF: °· You have chest pain. °· You have trouble breathing. °· You have new or increased swelling or pain in one leg. °· You cough up blood. °· You notice blood in vomit, in a bowel movement, or in urine. °MAKE SURE YOU: °· Understand these instructions. °· Will watch your condition. °· Will get help right away if you are not doing well or get worse. °Document Released: 12/16/2005 Document Revised: 10/06/2013 Document Reviewed: 08/23/2013 °[employer]® Patient Information ©2015 [employer]. This information is not intended to replace advice given to you by your health care provider. Make sure you discuss any questions you have with your health care provider. ° °

## 2014-07-05 ENCOUNTER — Other Ambulatory Visit: Payer: Self-pay | Admitting: *Deleted

## 2014-07-05 MED ORDER — FENTANYL 50 MCG/HR TD PT72: 50.0000 ug | MEDICATED_PATCH | TRANSDERMAL | Status: DC

## 2014-07-06 NOTE — Telephone Encounter (Signed)
Pt here at the office. Rx given to front office for them to get proper documentation for patient to have written RX.

## 2014-07-12 ENCOUNTER — Encounter (HOSPITAL_COMMUNITY): Payer: Self-pay | Admitting: Emergency Medicine

## 2014-07-12 ENCOUNTER — Observation Stay (HOSPITAL_COMMUNITY)
Admission: EM | Admit: 2014-07-12 | Discharge: 2014-07-14 | Disposition: A | Discharge status: Home / Self Care | Payer: Medicare PPO | Attending: Internal Medicine | Admitting: Internal Medicine

## 2014-07-12 ENCOUNTER — Emergency Department (HOSPITAL_COMMUNITY): Payer: Medicare PPO

## 2014-07-12 DIAGNOSIS — Z885 Allergy status to narcotic agent status: Secondary | ICD-10-CM | POA: Diagnosis not present

## 2014-07-12 DIAGNOSIS — R079 Chest pain, unspecified: Secondary | ICD-10-CM | POA: Diagnosis not present

## 2014-07-12 DIAGNOSIS — E11319 Type 2 diabetes mellitus with unspecified diabetic retinopathy without macular edema: Secondary | ICD-10-CM | POA: Diagnosis not present

## 2014-07-12 DIAGNOSIS — Y9301 Activity, walking, marching and hiking: Secondary | ICD-10-CM | POA: Insufficient documentation

## 2014-07-12 DIAGNOSIS — Y929 Unspecified place or not applicable: Secondary | ICD-10-CM | POA: Diagnosis not present

## 2014-07-12 DIAGNOSIS — Z8546 Personal history of malignant neoplasm of prostate: Secondary | ICD-10-CM | POA: Insufficient documentation

## 2014-07-12 DIAGNOSIS — S4980XA Other specified injuries of shoulder and upper arm, unspecified arm, initial encounter: Secondary | ICD-10-CM | POA: Insufficient documentation

## 2014-07-12 DIAGNOSIS — E785 Hyperlipidemia, unspecified: Secondary | ICD-10-CM | POA: Diagnosis not present

## 2014-07-12 DIAGNOSIS — S99919A Unspecified injury of unspecified ankle, initial encounter: Secondary | ICD-10-CM | POA: Diagnosis not present

## 2014-07-12 DIAGNOSIS — Z8739 Personal history of other diseases of the musculoskeletal system and connective tissue: Secondary | ICD-10-CM | POA: Diagnosis not present

## 2014-07-12 DIAGNOSIS — S99929A Unspecified injury of unspecified foot, initial encounter: Secondary | ICD-10-CM

## 2014-07-12 DIAGNOSIS — R0602 Shortness of breath: Secondary | ICD-10-CM | POA: Insufficient documentation

## 2014-07-12 DIAGNOSIS — E1142 Type 2 diabetes mellitus with diabetic polyneuropathy: Secondary | ICD-10-CM | POA: Insufficient documentation

## 2014-07-12 DIAGNOSIS — E1149 Type 2 diabetes mellitus with other diabetic neurological complication: Secondary | ICD-10-CM | POA: Insufficient documentation

## 2014-07-12 DIAGNOSIS — Z79899 Other long term (current) drug therapy: Secondary | ICD-10-CM | POA: Diagnosis not present

## 2014-07-12 DIAGNOSIS — N189 Chronic kidney disease, unspecified: Secondary | ICD-10-CM | POA: Insufficient documentation

## 2014-07-12 DIAGNOSIS — R55 Syncope and collapse: Secondary | ICD-10-CM | POA: Diagnosis not present

## 2014-07-12 DIAGNOSIS — E119 Type 2 diabetes mellitus without complications: Secondary | ICD-10-CM | POA: Diagnosis present

## 2014-07-12 DIAGNOSIS — Z881 Allergy status to other antibiotic agents status: Secondary | ICD-10-CM | POA: Insufficient documentation

## 2014-07-12 DIAGNOSIS — I129 Hypertensive chronic kidney disease with stage 1 through stage 4 chronic kidney disease, or unspecified chronic kidney disease: Secondary | ICD-10-CM | POA: Diagnosis not present

## 2014-07-12 DIAGNOSIS — R296 Repeated falls: Secondary | ICD-10-CM | POA: Diagnosis not present

## 2014-07-12 DIAGNOSIS — Z86718 Personal history of other venous thrombosis and embolism: Secondary | ICD-10-CM | POA: Diagnosis not present

## 2014-07-12 DIAGNOSIS — R42 Dizziness and giddiness: Secondary | ICD-10-CM | POA: Diagnosis not present

## 2014-07-12 DIAGNOSIS — Z7982 Long term (current) use of aspirin: Secondary | ICD-10-CM | POA: Insufficient documentation

## 2014-07-12 DIAGNOSIS — S46909A Unspecified injury of unspecified muscle, fascia and tendon at shoulder and upper arm level, unspecified arm, initial encounter: Secondary | ICD-10-CM | POA: Diagnosis not present

## 2014-07-12 DIAGNOSIS — K219 Gastro-esophageal reflux disease without esophagitis: Secondary | ICD-10-CM | POA: Diagnosis not present

## 2014-07-12 DIAGNOSIS — C61 Malignant neoplasm of prostate: Secondary | ICD-10-CM | POA: Diagnosis present

## 2014-07-12 DIAGNOSIS — I2510 Atherosclerotic heart disease of native coronary artery without angina pectoris: Secondary | ICD-10-CM | POA: Diagnosis present

## 2014-07-12 DIAGNOSIS — Z87891 Personal history of nicotine dependence: Secondary | ICD-10-CM | POA: Insufficient documentation

## 2014-07-12 DIAGNOSIS — Z9981 Dependence on supplemental oxygen: Secondary | ICD-10-CM | POA: Insufficient documentation

## 2014-07-12 DIAGNOSIS — S8990XA Unspecified injury of unspecified lower leg, initial encounter: Secondary | ICD-10-CM | POA: Insufficient documentation

## 2014-07-12 DIAGNOSIS — G4730 Sleep apnea, unspecified: Secondary | ICD-10-CM | POA: Diagnosis not present

## 2014-07-12 DIAGNOSIS — G8929 Other chronic pain: Secondary | ICD-10-CM | POA: Insufficient documentation

## 2014-07-12 DIAGNOSIS — Z888 Allergy status to other drugs, medicaments and biological substances status: Secondary | ICD-10-CM | POA: Insufficient documentation

## 2014-07-12 DIAGNOSIS — I10 Essential (primary) hypertension: Secondary | ICD-10-CM

## 2014-07-12 LAB — CBC
HCT: 36.9 % — ABNORMAL LOW (ref 39.0–52.0)
HEMOGLOBIN: 12.7 g/dL — AB (ref 13.0–17.0)
MCH: 30.2 pg (ref 26.0–34.0)
MCHC: 34.4 g/dL (ref 30.0–36.0)
MCV: 87.6 fL (ref 78.0–100.0)
Platelets: 138 10*3/uL — ABNORMAL LOW (ref 150–400)
RBC: 4.21 MIL/uL — ABNORMAL LOW (ref 4.22–5.81)
RDW: 13.4 % (ref 11.5–15.5)
WBC: 5 10*3/uL (ref 4.0–10.5)

## 2014-07-12 LAB — BASIC METABOLIC PANEL
Anion gap: 10 (ref 5–15)
BUN: 16 mg/dL (ref 6–23)
CALCIUM: 8.8 mg/dL (ref 8.4–10.5)
CO2: 29 mEq/L (ref 19–32)
Chloride: 101 mEq/L (ref 96–112)
Creatinine, Ser: 1.31 mg/dL (ref 0.50–1.35)
GFR calc Af Amer: 57 mL/min — ABNORMAL LOW (ref >90)
GFR, EST NON AFRICAN AMERICAN: 49 mL/min — AB (ref >90)
Glucose, Bld: 135 mg/dL — ABNORMAL HIGH (ref 70–99)
Potassium: 4.1 mEq/L (ref 3.7–5.3)
SODIUM: 140 meq/L (ref 137–147)

## 2014-07-12 LAB — CBG MONITORING, ED
Glucose-Capillary: 134 mg/dL — ABNORMAL HIGH (ref 70–99)
Glucose-Capillary: 116 mg/dL — ABNORMAL HIGH (ref 70–99)

## 2014-07-12 LAB — TROPONIN I
Troponin I: <0.3 ng/mL (ref <0.30)
Troponin I: <0.3 ng/mL (ref <0.30)

## 2014-07-12 MED ORDER — AMLODIPINE BESYLATE 5 MG PO TABS
5.0000 mg | ORAL_TABLET | Freq: Every day | ORAL | Status: DC
  Administered 2014-07-13 – 2014-07-14 (×2): 5 mg via ORAL
  Filled 2014-07-12 (×2): qty 1

## 2014-07-12 MED ORDER — METFORMIN HCL ER 500 MG PO TB24
500.0000 mg | ORAL_TABLET | Freq: Every day | ORAL | Status: DC
  Administered 2014-07-13 – 2014-07-14 (×2): 500 mg via ORAL
  Filled 2014-07-12 (×3): qty 1

## 2014-07-12 MED ORDER — INSULIN ASPART 100 UNIT/ML ~~LOC~~ SOLN
0.0000 [IU] | Freq: Three times a day (TID) | SUBCUTANEOUS | Status: DC
  Administered 2014-07-13: 4 [IU] via SUBCUTANEOUS
  Administered 2014-07-14: 2 [IU] via SUBCUTANEOUS

## 2014-07-12 MED ORDER — HEPARIN SODIUM (PORCINE) 5000 UNIT/ML IJ SOLN
5000.0000 [IU] | Freq: Three times a day (TID) | INTRAMUSCULAR | Status: DC
  Administered 2014-07-12 – 2014-07-14 (×5): 5000 [IU] via SUBCUTANEOUS
  Filled 2014-07-12 (×5): qty 1

## 2014-07-12 MED ORDER — FUROSEMIDE 40 MG PO TABS: 40.0000 mg | ORAL_TABLET | Freq: Every day | ORAL | Status: DC

## 2014-07-12 MED ORDER — PANTOPRAZOLE SODIUM 40 MG PO TBEC
40.0000 mg | DELAYED_RELEASE_TABLET | Freq: Every day | ORAL | Status: DC
Start: 2014-07-13 — End: 2014-07-14
  Administered 2014-07-13 – 2014-07-14 (×2): 40 mg via ORAL
  Filled 2014-07-12 (×2): qty 1

## 2014-07-12 MED ORDER — PANTOPRAZOLE SODIUM 40 MG PO TBEC: 40.0000 mg | DELAYED_RELEASE_TABLET | Freq: Every day | ORAL | Status: DC

## 2014-07-12 MED ORDER — FUROSEMIDE 40 MG PO TABS
40.0000 mg | ORAL_TABLET | Freq: Every day | ORAL | Status: DC
  Administered 2014-07-13 – 2014-07-14 (×2): 40 mg via ORAL
  Filled 2014-07-12 (×2): qty 1

## 2014-07-12 MED ORDER — CITALOPRAM HYDROBROMIDE 20 MG PO TABS
20.0000 mg | ORAL_TABLET | Freq: Every day | ORAL | Status: DC
  Administered 2014-07-13 – 2014-07-14 (×2): 20 mg via ORAL
  Filled 2014-07-12 (×2): qty 1

## 2014-07-12 MED ORDER — OMEGA-3-ACID ETHYL ESTERS 1 G PO CAPS
1.0000 g | ORAL_CAPSULE | Freq: Every day | ORAL | Status: DC
  Administered 2014-07-13 – 2014-07-14 (×2): 1 g via ORAL
  Filled 2014-07-12 (×2): qty 1

## 2014-07-12 MED ORDER — FINASTERIDE 5 MG PO TABS
5.0000 mg | ORAL_TABLET | Freq: Every day | ORAL | Status: DC
  Administered 2014-07-13 – 2014-07-14 (×2): 5 mg via ORAL
  Filled 2014-07-12 (×3): qty 1

## 2014-07-12 MED ORDER — ONDANSETRON HCL 4 MG PO TABS: 4.0000 mg | ORAL_TABLET | Freq: Four times a day (QID) | ORAL | Status: DC | PRN

## 2014-07-12 MED ORDER — TERAZOSIN HCL 1 MG PO CAPS
2.0000 mg | ORAL_CAPSULE | Freq: Every day | ORAL | Status: DC
  Administered 2014-07-12 – 2014-07-13 (×2): 2 mg via ORAL
  Filled 2014-07-12: qty 1
  Filled 2014-07-12 (×3): qty 2

## 2014-07-12 MED ORDER — INSULIN ASPART 100 UNIT/ML ~~LOC~~ SOLN: 0.0000 [IU] | Freq: Every day | SUBCUTANEOUS | Status: DC

## 2014-07-12 MED ORDER — FENTANYL 50 MCG/HR TD PT72: 50.0000 ug | MEDICATED_PATCH | TRANSDERMAL | Status: DC

## 2014-07-12 MED ORDER — OXYCODONE-ACETAMINOPHEN 5-325 MG PO TABS: 1.0000 | ORAL_TABLET | Freq: Every day | ORAL | Status: DC | PRN

## 2014-07-12 MED ORDER — ATORVASTATIN CALCIUM 10 MG PO TABS
10.0000 mg | ORAL_TABLET | Freq: Every day | ORAL | Status: DC
  Administered 2014-07-12 – 2014-07-13 (×2): 10 mg via ORAL
  Filled 2014-07-12 (×2): qty 1

## 2014-07-12 MED ORDER — MORPHINE SULFATE 2 MG/ML IJ SOLN: 2.0000 mg | INTRAMUSCULAR | Status: DC | PRN

## 2014-07-12 MED ORDER — AMLODIPINE BESYLATE 5 MG PO TABS: 5.0000 mg | ORAL_TABLET | Freq: Every day | ORAL | Status: DC

## 2014-07-12 MED ORDER — ONDANSETRON HCL 4 MG/2ML IJ SOLN: 4.0000 mg | Freq: Four times a day (QID) | INTRAMUSCULAR | Status: DC | PRN

## 2014-07-12 MED ORDER — ASPIRIN 81 MG PO CHEW: 81.0000 mg | CHEWABLE_TABLET | Freq: Every day | ORAL | Status: DC

## 2014-07-12 MED ORDER — LOSARTAN POTASSIUM 50 MG PO TABS
50.0000 mg | ORAL_TABLET | Freq: Every day | ORAL | Status: DC
  Administered 2014-07-13 – 2014-07-14 (×2): 50 mg via ORAL
  Filled 2014-07-12 (×2): qty 1

## 2014-07-12 MED ORDER — FENTANYL 50 MCG/HR TD PT72
50.0000 ug | MEDICATED_PATCH | TRANSDERMAL | Status: DC
Start: 2014-07-14 — End: 2014-07-14

## 2014-07-12 MED ORDER — VITAMIN B-12 1000 MCG PO TABS
1000.0000 ug | ORAL_TABLET | Freq: Every day | ORAL | Status: DC
  Administered 2014-07-13 – 2014-07-14 (×2): 1000 ug via ORAL
  Filled 2014-07-12 (×2): qty 1

## 2014-07-12 MED ORDER — ASPIRIN 81 MG PO CHEW
81.0000 mg | CHEWABLE_TABLET | Freq: Every day | ORAL | Status: DC
  Administered 2014-07-13 – 2014-07-14 (×2): 81 mg via ORAL
  Filled 2014-07-12 (×2): qty 1

## 2014-07-12 MED ORDER — DOCUSATE SODIUM 100 MG PO CAPS
300.0000 mg | ORAL_CAPSULE | Freq: Two times a day (BID) | ORAL | Status: DC | PRN
  Administered 2014-07-13: 300 mg via ORAL
  Filled 2014-07-12: qty 3

## 2014-07-12 MED ORDER — VITAMIN D 1000 UNITS PO TABS
2000.0000 [IU] | ORAL_TABLET | Freq: Two times a day (BID) | ORAL | Status: DC
  Administered 2014-07-12 – 2014-07-14 (×4): 2000 [IU] via ORAL
  Filled 2014-07-12 (×4): qty 2

## 2014-07-12 NOTE — ED Provider Notes (Addendum)
TIME SEEN: 2:45 PM  CHIEF COMPLAINT: Chest pain, near-syncope  HPI: Patient is an 80 year old male with history of diabetes, hypertension, hyperlipidemia, prior tobacco use who presents the emergency department with an episode of dizziness, near syncope and chest pressure that happened while walking in [employer] today and lasted approximately 15 minutes. He states he has chronic shortness of breath and that he does not feel this change today. No nausea, vomiting or diaphoresis. He states he is now asymptomatic. States the last time he had similar symptoms was 9 months ago. States his last crit catheterization was between 3-4 years ago. Denies any stents. Denies a history of MI. Denies any fever or cough. No new lower extremity swelling or pain.   He states he did have a mechanical fall 4 days ago while walking to take his trash out and injured his left arm and leg but he has been able to ambulate. He did not hit his head. He is not on anticoagulation.  Cardiologist is Dr. Percival Spanish PCP is Dr. Laverta Baltimore  ROS: See HPI Constitutional: no fever  Eyes: no drainage  ENT: no runny nose   Cardiovascular:  no chest pain  Resp: no SOB  GI: no vomiting GU: no dysuria Integumentary: no rash  Allergy: no hives  Musculoskeletal: no leg swelling  Neurological: no slurred speech ROS otherwise negative  PAST MEDICAL HISTORY/PAST SURGICAL HISTORY:  Past Medical History  Diagnosis Date  . DVT of leg (deep venous thrombosis) 1999    RLE   . Hypertension   . Hiatal hernia   . Thyroid nodule   . Goiter     stable x many year - last endo exam 08/2010  . Sleep apnea   . Type 2 diabetes mellitus   . Chronic kidney disease   . Diabetic retinopathy     right eye   . Diabetic neuropathy   . Osteopenia   . CAD (coronary artery disease)     (Left main normal, LAD 30-40% mid stenosis, diagonal 30-40% stenosis, circumflex 30-40% stenosis, right coronary artery 30-40% stenosis. 2003.)  . Sleep apnea     CPAP  .  GERD (gastroesophageal reflux disease)   . Diabetes mellitus   . Hyperlipidemia   . HTN (hypertension)   . Prostate cancer   . Chronic back pain   . DVT (deep venous thrombosis)   . Lumbar spondylosis     MEDICATIONS:  Prior to Admission medications   Medication Sig Start Date End Date Taking? Authorizing Provider  amLODipine (NORVASC) 5 MG tablet Take 1 tablet (5 mg total) by mouth daily. 06/16/14  Yes Chipper Herb, MD  aspirin (ASPIRIN CHILDRENS) 81 MG chewable tablet Chew 81 mg by mouth daily.     Yes Historical Provider, MD  Cholecalciferol (VITAMIN D3) 2000 UNITS capsule Take 2,000 Units by mouth 2 (two) times daily.    Yes Historical Provider, MD  citalopram (CELEXA) 20 MG tablet TAKE 1 TABLET DAILY 05/26/14  Yes Chipper Herb, MD  docusate sodium (STOOL SOFTENER) 100 MG capsule Take 300 mg by mouth 2 (two) times daily as needed for mild constipation.    Yes Historical Provider, MD  fentaNYL (DURAGESIC) 50 MCG/HR Place 1 patch (50 mcg total) onto the skin every 3 (three) days. 07/05/14  Yes Mary-Margaret Hassell Done, FNP  finasteride (PROSCAR) 5 MG tablet Take 1 tablet (5 mg total) by mouth daily. 06/16/14  Yes Chipper Herb, MD  furosemide (LASIX) 40 MG tablet TAKE 1 TABLET DAILY  Yes Vernie Shanks, MD  losartan (COZAAR) 50 MG tablet TAKE 1 TABLET DAILY 01/31/14  Yes Vernie Shanks, MD  metFORMIN (GLUCOPHAGE-XR) 500 MG 24 hr tablet Take 1 tablet (500 mg total) by mouth daily with breakfast. 02/14/14  Yes Vernie Shanks, MD  Omega-3 Fatty Acids (FISH OIL) 1200 MG CAPS Take 1 capsule by mouth 5 (five) times daily.   Yes Historical Provider, MD  omeprazole (PRILOSEC) 20 MG capsule Take 1 capsule (20 mg total) by mouth daily. 02/14/14  Yes Vernie Shanks, MD  oxyCODONE-acetaminophen (PERCOCET/ROXICET) 5-325 MG per tablet Take 1 tablet by mouth daily as needed for moderate pain or severe pain.   Yes Historical Provider, MD  rosuvastatin (CRESTOR) 5 MG tablet Take 5 mg by mouth at bedtime.   Yes  Historical Provider, MD  terazosin (HYTRIN) 2 MG capsule TAKE 1 CAPSULE AT BEDTIME 03/18/14  Yes Vernie Shanks, MD  vitamin B-12 (CYANOCOBALAMIN) 1000 MCG tablet Take 1,000 mcg by mouth daily.   Yes Historical Provider, MD    ALLERGIES:  Allergies  Allergen Reactions  . Actos [Pioglitazone]     Allergic reaction  . Bactrim   . Celebrex [Celecoxib]   . Doxycycline     Caused black spots in his eyes  . Flagyl [Metronidazole Hcl]   . Gabapentin   . Lyrica [Pregabalin]   . Lyrica [Pregabalin]     Increase appetite   . Morphine And Related   . Motrin [Ibuprofen]   . Nsaids   . Nsaids     Swelling    . Ultram [Tramadol Hcl]   . Ultram [Tramadol Hcl]     Itching / rash     . Vioxx [Rofecoxib]     Swelling    . Celebrex [Celecoxib] Rash    SOCIAL HISTORY:  History  Substance Use Topics  . Smoking status: Former Smoker    Types: Cigarettes, Pipe, Cigars    Quit date: 11/08/2003  . Smokeless tobacco: Former Systems developer    Quit date: 11/08/2003  . Alcohol Use: No    FAMILY HISTORY: Family History  Problem Relation Age of Onset  . Hyperlipidemia Mother   . Hypertension Mother   . Deep vein thrombosis Mother   . Prostate cancer Father   . Colon cancer Sister   . Cancer Father 48  . Pulmonary embolism Mother   . Diabetes Mother     EXAM: BP 129/91  Pulse 78  Temp(Src) 97.8 F (36.6 C) (Oral)  Resp 20  Ht 5\' 8"  (1.727 m)  Wt 215 lb (97.523 kg)  BMI 32.70 kg/m2  SpO2 100% CONSTITUTIONAL: Alert and oriented and responds appropriately to questions. Well-appearing; well-nourished HEAD: Normocephalic EYES: Conjunctivae clear, PERRL ENT: normal nose; no rhinorrhea; moist mucous membranes; pharynx without lesions noted NECK: Supple, no meningismus, no LAD  CARD: RRR; S1 and S2 appreciated; no murmurs, no clicks, no rubs, no gallops RESP: Normal chest excursion without splinting or tachypnea; breath sounds clear and equal bilaterally; no wheezes, no rhonchi, no rales, no  respiratory distress or hypoxia ABD/GI: Normal bowel sounds; non-distended; soft, non-tender, no rebound, no guarding BACK:  The back appears normal and is non-tender to palpation, there is no CVA tenderness EXT: No bony tenderness on palpation of his left arm or lower extremity, 2+ radial and DP pulses bilaterally, no joint effusion, Normal ROM in all joints; non-tender to palpation; no edema; normal capillary refill; no cyanosis    SKIN: Normal color for age and race;  warm NEURO: Moves all extremities equally, sensation to light touch intact diffusely PSYCH: The patient's mood and manner are appropriate. Grooming and personal hygiene are appropriate.  MEDICAL DECISION MAKING: Patient here with chest pain and near-syncope. He has multiple risk factors for ACS. He is completely asymptomatic currently therefore I do not feel this is a PE or dissection. Will obtain cardiac labs, chest x-ray. EKG shows no new ischemic changes. I feel he will likely need admission for ACS rule out.  ED PROGRESS: Patient is still asymptomatic, hemodynamically stable. His labs are unremarkable including a negative troponin. Chest x-ray clear. Discussed with patient and his daughter at bedside they recommended admission. Patient agrees. Discussed with Dr. Anastasio Champion with hospitalist service who agrees to admit patient to telemetry bed for observation.    EKG Interpretation  Date/Time:  Tuesday July 12 2014 14:23:14 EDT Ventricular Rate:  77 PR Interval:  222 QRS Duration: 90 QT Interval:  387 QTC Calculation: 438 R Axis:   -6 Text Interpretation:  Sinus rhythm Prolonged PR interval Low voltage, precordial leads No significant change since last tracing Confirmed by WARD,  DO, KRISTEN [phone]) on 07/12/2014 2:53:20 PM        Delice Bison Ward, DO 07/12/14 [place], DO 07/12/14 [id number]

## 2014-07-12 NOTE — ED Notes (Signed)
Per EMS, pt was driving to [employer], pt states he started feeling dizzy and felt substernal chest pain 6/10 and called EMS. Pt has a 6mcg Fentanyl patch on rt arm for chronic arthritis pain. CBG 154, IV started 20G Rt AC.

## 2014-07-12 NOTE — Progress Notes (Signed)
Patient states that he can not sleep at night.  That he has restless leg syndrome and is up every night until around 04:00.  Would like to discuss medications and possible sleep aids.

## 2014-07-12 NOTE — H&P (Signed)
[facility] History and Physical  [name] RAQ:[id number] DOB: September 15, 1930 DOA: 07/12/2014  Referring physician: Dr. Leonides Schanz, ER. PCP: Anthoney Harada, MD   Chief Complaint: Chest pain.  HPI: [name] is a 80 y.o. male  This 80 year old man, who has documented coronary artery disease, presents with an episode of chest pain which started at 1 PM when he was in the grocery store today. The pain was central, heavy/dull in nature and was associated with degree of dyspnea. There is no nausea, vomiting or sweating. The pain did not radiate anywhere else. He has never had this kind of pain before. EMS was called and the pain gradually wore off over the course of 35-45 minutes. He is now currently pain-free. He is diabetic, hypertensive and previously a smoker. He has hyperlipidemia. He is now being admitted for further investigation.   Review of Systems:  Constitutional:  No weight loss, night sweats, Fevers, chills, fatigue.  HEENT:  No headaches, Difficulty swallowing,Tooth/dental problems,Sore throat,  No sneezing, itching, ear ache, nasal congestion, post nasal drip,   GI:  No heartburn, indigestion, abdominal pain, nausea, vomiting, diarrhea, change in bowel habits, loss of appetite  Resp:  No shortness of breath with exertion or at rest. No excess mucus, no productive cough, No non-productive cough, No coughing up of blood.No change in color of mucus.No wheezing.No chest wall deformity  Skin:  no rash or lesions.  GU:  no dysuria, change in color of urine, no urgency or frequency. No flank pain.  Musculoskeletal:  No joint pain or swelling. No decreased range of motion. No back pain.  Psych:  No change in mood or affect. No depression or anxiety. No memory loss.   Past Medical History  Diagnosis Date  . DVT of leg (deep venous thrombosis) 1999    RLE   . Hypertension   . Hiatal hernia   . Thyroid nodule   . Goiter     stable x many year - last endo  exam 08/2010  . Sleep apnea   . Type 2 diabetes mellitus   . Chronic kidney disease   . Diabetic retinopathy     right eye   . Diabetic neuropathy   . Osteopenia   . CAD (coronary artery disease)     (Left main normal, LAD 30-40% mid stenosis, diagonal 30-40% stenosis, circumflex 30-40% stenosis, right coronary artery 30-40% stenosis. 2003.)  . Sleep apnea     CPAP  . GERD (gastroesophageal reflux disease)   . Diabetes mellitus   . Hyperlipidemia   . HTN (hypertension)   . Prostate cancer   . Chronic back pain   . DVT (deep venous thrombosis)   . Lumbar spondylosis    Past Surgical History  Procedure Laterality Date  . Tonsilectomy, adenoidectomy, bilateral myringotomy and tubes    . Inguinal hernia repair      bilateral  . Knee arthroscopy      left   . Biopsy of right ear    . Cholecystectomy    . Cataract extraction, bilateral    . Tonsillectomy and adenoidectomy    . Appendectomy    . Inguinal hernia repair      Bilateral   Social History:  reports that he quit smoking about 10 years ago. His smoking use included Cigarettes, Pipe, and Cigars. He smoked 0.00 packs per day. He quit smokeless tobacco use about 10 years ago. He reports that he does not drink alcohol or use illicit drugs.  Allergies  Allergen Reactions  . Actos [Pioglitazone]     Allergic reaction  . Bactrim   . Celebrex [Celecoxib]   . Doxycycline     Caused black spots in his eyes  . Flagyl [Metronidazole Hcl]   . Gabapentin   . Lyrica [Pregabalin]   . Lyrica [Pregabalin]     Increase appetite   . Morphine And Related   . Motrin [Ibuprofen]   . Nsaids   . Nsaids     Swelling    . Ultram [Tramadol Hcl]   . Ultram [Tramadol Hcl]     Itching / rash     . Vioxx [Rofecoxib]     Swelling    . Celebrex [Celecoxib] Rash    Family History  Problem Relation Age of Onset  . Hyperlipidemia Mother   . Hypertension Mother   . Deep vein thrombosis Mother   . Prostate cancer Father   . Colon  cancer Sister   . Cancer Father 10  . Pulmonary embolism Mother   . Diabetes Mother      Prior to Admission medications   Medication Sig Start Date End Date Taking? Authorizing Provider  amLODipine (NORVASC) 5 MG tablet Take 1 tablet (5 mg total) by mouth daily. 06/16/14  Yes Chipper Herb, MD  aspirin (ASPIRIN CHILDRENS) 81 MG chewable tablet Chew 81 mg by mouth daily.     Yes Historical Provider, MD  Cholecalciferol (VITAMIN D3) 2000 UNITS capsule Take 2,000 Units by mouth 2 (two) times daily.    Yes Historical Provider, MD  citalopram (CELEXA) 20 MG tablet TAKE 1 TABLET DAILY 05/26/14  Yes Chipper Herb, MD  docusate sodium (STOOL SOFTENER) 100 MG capsule Take 300 mg by mouth 2 (two) times daily as needed for mild constipation.    Yes Historical Provider, MD  fentaNYL (DURAGESIC) 50 MCG/HR Place 1 patch (50 mcg total) onto the skin every 3 (three) days. 07/05/14  Yes Mary-Margaret Hassell Done, FNP  finasteride (PROSCAR) 5 MG tablet Take 1 tablet (5 mg total) by mouth daily. 06/16/14  Yes Chipper Herb, MD  furosemide (LASIX) 40 MG tablet TAKE 1 TABLET DAILY   Yes Vernie Shanks, MD  losartan (COZAAR) 50 MG tablet TAKE 1 TABLET DAILY 01/31/14  Yes Vernie Shanks, MD  metFORMIN (GLUCOPHAGE-XR) 500 MG 24 hr tablet Take 1 tablet (500 mg total) by mouth daily with breakfast. 02/14/14  Yes Vernie Shanks, MD  Omega-3 Fatty Acids (FISH OIL) 1200 MG CAPS Take 1 capsule by mouth 5 (five) times daily.   Yes Historical Provider, MD  omeprazole (PRILOSEC) 20 MG capsule Take 1 capsule (20 mg total) by mouth daily. 02/14/14  Yes Vernie Shanks, MD  oxyCODONE-acetaminophen (PERCOCET/ROXICET) 5-325 MG per tablet Take 1 tablet by mouth daily as needed for moderate pain or severe pain.   Yes Historical Provider, MD  rosuvastatin (CRESTOR) 5 MG tablet Take 5 mg by mouth at bedtime.   Yes Historical Provider, MD  terazosin (HYTRIN) 2 MG capsule TAKE 1 CAPSULE AT BEDTIME 03/18/14  Yes Vernie Shanks, MD  vitamin B-12  (CYANOCOBALAMIN) 1000 MCG tablet Take 1,000 mcg by mouth daily.   Yes Historical Provider, MD   Physical Exam: Filed Vitals:   07/12/14 1421  BP: 129/91  Pulse: 78  Temp: 97.8 F (36.6 C)  Resp: 20    BP 129/91  Pulse 78  Temp(Src) 97.8 F (36.6 C) (Oral)  Resp 20  Ht 5\' 8"  (1.727 m)  Wt 97.523 kg (  215 lb)  BMI 32.70 kg/m2  SpO2 100%  General:  Appears calm and comfortable Eyes: PERRL, normal lids, irises & conjunctiva ENT: grossly normal hearing, lips & tongue Neck: no LAD, masses or thyromegaly Cardiovascular: RRR, no m/r/g. No LE edema. Telemetry: SR, no arrhythmias  Respiratory: CTA bilaterally, no w/r/r. Normal respiratory effort. Abdomen: soft, ntnd Skin: no rash or induration seen on limited exam Musculoskeletal: grossly normal tone BUE/BLE Psychiatric: grossly normal mood and affect, speech fluent and appropriate Neurologic: grossly non-focal.          Labs on Admission:  Basic Metabolic Panel:  Recent Labs Lab 07/12/14 1449  NA 140  K 4.1  CL 101  CO2 29  GLUCOSE 135*  BUN 16  CREATININE 1.31  CALCIUM 8.8   Liver Function Tests: No results found for this basename: AST, ALT, ALKPHOS, BILITOT, PROT, ALBUMIN,  in the last 168 hours No results found for this basename: LIPASE, AMYLASE,  in the last 168 hours No results found for this basename: AMMONIA,  in the last 168 hours CBC:  Recent Labs Lab 07/12/14 1449  WBC 5.0  HGB 12.7*  HCT 36.9*  MCV 87.6  PLT 138*   Cardiac Enzymes:  Recent Labs Lab 07/12/14 1449  TROPONINI <0.30    BNP (last 3 results) No results found for this basename: PROBNP,  in the last 8760 hours CBG:  Recent Labs Lab 07/12/14 1428 07/12/14 1644  GLUCAP 134* 116*    Radiological Exams on Admission: Dg Chest Portable 1 View  07/12/2014   CLINICAL DATA:  Chest pain.  EXAM: PORTABLE CHEST - 1 VIEW  COMPARISON:  Portable chest 09/25/2008.  CT chest 01/07/2004.  FINDINGS: Lungs clear. Heart size normal. No  pneumothorax or pleural effusion.  IMPRESSION: No acute disease.   Electronically Signed   By: Inge Rise M.D.   On: 07/12/2014 15:05    EKG: Independently reviewed. Normal sinus rhythm without any acute ST-T wave changes.  Assessment/Plan   1. Chest pain, possibly cardiac in nature. Multiple risk factors. 2. Hypertension. 3. Diabetes type 2. 4. Hyperlipidemia. 5. History of documented coronary artery disease. 6. History of prostate cancer.  Plan: 1. Admit to telemetry. 2. Serial cardiac enzymes. 3. Cardiology consultation. 4. Monitor and control diabetes.  Further recommendations will depend on patient's hospital progress.  Code Status: Full code.   Family Communication: I discussed the plan with the patient at the bedside.   Disposition Plan: Home when medically stable.  Time spent: 60 minutes.  Doree Albee [facility] Pager [phone].  **Disclaimer: This note may have been dictated with voice recognition software. Similar sounding words can inadvertently be transcribed and this note may contain transcription errors which may not have been corrected upon publication of note.**

## 2014-07-13 DIAGNOSIS — I379 Nonrheumatic pulmonary valve disorder, unspecified: Secondary | ICD-10-CM

## 2014-07-13 DIAGNOSIS — R079 Chest pain, unspecified: Secondary | ICD-10-CM

## 2014-07-13 LAB — COMPREHENSIVE METABOLIC PANEL
ALT: 13 U/L (ref 0–53)
AST: 16 U/L (ref 0–37)
Albumin: 3.2 g/dL — ABNORMAL LOW (ref 3.5–5.2)
Alkaline Phosphatase: 58 U/L (ref 39–117)
Anion gap: 8 (ref 5–15)
BUN: 16 mg/dL (ref 6–23)
CO2: 31 mEq/L (ref 19–32)
CREATININE: 1.33 mg/dL (ref 0.50–1.35)
Calcium: 8.7 mg/dL (ref 8.4–10.5)
Chloride: 103 mEq/L (ref 96–112)
GFR calc non Af Amer: 48 mL/min — ABNORMAL LOW (ref >90)
GFR, EST AFRICAN AMERICAN: 56 mL/min — AB (ref >90)
GLUCOSE: 98 mg/dL (ref 70–99)
Potassium: 4.3 mEq/L (ref 3.7–5.3)
Sodium: 142 mEq/L (ref 137–147)
Total Bilirubin: 0.4 mg/dL (ref 0.3–1.2)
Total Protein: 6.1 g/dL (ref 6.0–8.3)

## 2014-07-13 LAB — GLUCOSE, CAPILLARY
GLUCOSE-CAPILLARY: 137 mg/dL — AB (ref 70–99)
Glucose-Capillary: 110 mg/dL — ABNORMAL HIGH (ref 70–99)
Glucose-Capillary: 164 mg/dL — ABNORMAL HIGH (ref 70–99)
Glucose-Capillary: 94 mg/dL (ref 70–99)

## 2014-07-13 LAB — TROPONIN I

## 2014-07-13 LAB — CBC
HCT: 36.4 % — ABNORMAL LOW (ref 39.0–52.0)
Hemoglobin: 12.2 g/dL — ABNORMAL LOW (ref 13.0–17.0)
MCH: 29.5 pg (ref 26.0–34.0)
MCHC: 33.5 g/dL (ref 30.0–36.0)
MCV: 88.1 fL (ref 78.0–100.0)
Platelets: 133 10*3/uL — ABNORMAL LOW (ref 150–400)
RBC: 4.13 MIL/uL — ABNORMAL LOW (ref 4.22–5.81)
RDW: 13.3 % (ref 11.5–15.5)
WBC: 5 10*3/uL (ref 4.0–10.5)

## 2014-07-13 LAB — TSH: TSH: 0.647 u[IU]/mL (ref 0.350–4.500)

## 2014-07-13 MED ORDER — REGADENOSON 0.4 MG/5ML IV SOLN
0.4000 mg | Freq: Once | INTRAVENOUS | Status: DC
  Filled 2014-07-13: qty 5

## 2014-07-13 NOTE — Progress Notes (Signed)
  Echocardiogram 2D Echocardiogram has been performed.  [place], [place] 07/13/2014, 4:41 PM

## 2014-07-13 NOTE — Consult Note (Signed)
Primary cardiologist: Dr Percival Spanish Consulting cardiologist: Dr Carlyle Dolly  Clinical Summary [name] is a 80 y.o.male history of CAD with cath in 2003 with mild to moderate non-obstructive disease, repeat cath 2005 with stable mild to moderate non-obstructive disease, LVEF >55% by LVgram at that time. MPI 10/2011 reported negative in clinic notes, unable to find actual report. Hx of DM, HTN, hyperlipidemia, OSA admitted with chest pain.  Reports yesterday around 1pm he was at the grocery store, and had a sudden 7/10 chest tightness in right chest with some SOB. No N/V, no diaphoresis, no palpitations. Pain was not positional, lasted approx 45 minutes.   TSH 0.647, K 4.1, Cr 1.31, GFR 49, Hgb 12.7, Plt 138, trop neg x 4 CXR no acute process EKG NSR with no ischemic changes.   Allergies  Allergen Reactions  . Actos [Pioglitazone]     Allergic reaction  . Bactrim   . Celebrex [Celecoxib]   . Doxycycline     Caused black spots in his eyes  . Flagyl [Metronidazole Hcl]   . Gabapentin   . Lyrica [Pregabalin]   . Lyrica [Pregabalin]     Increase appetite   . Morphine And Related   . Motrin [Ibuprofen]   . Nsaids   . Nsaids     Swelling    . Ultram [Tramadol Hcl]   . Ultram [Tramadol Hcl]     Itching / rash     . Vioxx [Rofecoxib]     Swelling    . Celebrex [Celecoxib] Rash    Medications Scheduled Medications: . amLODipine  5 mg Oral Daily  . aspirin  81 mg Oral Daily  . atorvastatin  10 mg Oral q1800  . cholecalciferol  2,000 Units Oral BID  . citalopram  20 mg Oral Daily  . [START ON 07/14/2014] fentaNYL  50 mcg Transdermal Q72H  . finasteride  5 mg Oral Daily  . furosemide  40 mg Oral Daily  . heparin  5,000 Units Subcutaneous 3 times per day  . insulin aspart  0-20 Units Subcutaneous TID WC  . insulin aspart  0-5 Units Subcutaneous QHS  . losartan  50 mg Oral Daily  . metFORMIN  500 mg Oral Q breakfast  . omega-3 acid ethyl esters  1 g Oral Daily  .  pantoprazole  40 mg Oral Daily  . terazosin  2 mg Oral QHS  . vitamin B-12  1,000 mcg Oral Daily     Infusions:     PRN Medications:  docusate sodium, ondansetron (ZOFRAN) IV, ondansetron, oxyCODONE-acetaminophen   Past Medical History  Diagnosis Date  . DVT of leg (deep venous thrombosis) 1999    RLE   . Hypertension   . Hiatal hernia   . Thyroid nodule   . Goiter     stable x many year - last endo exam 08/2010  . Sleep apnea   . Type 2 diabetes mellitus   . Chronic kidney disease   . Diabetic retinopathy     right eye   . Diabetic neuropathy   . Osteopenia   . CAD (coronary artery disease)     (Left main normal, LAD 30-40% mid stenosis, diagonal 30-40% stenosis, circumflex 30-40% stenosis, right coronary artery 30-40% stenosis. 2003.)  . Sleep apnea     CPAP  . GERD (gastroesophageal reflux disease)   . Diabetes mellitus   . Hyperlipidemia   . HTN (hypertension)   . Prostate cancer   . Chronic back pain   .  DVT (deep venous thrombosis)   . Lumbar spondylosis     Past Surgical History  Procedure Laterality Date  . Tonsilectomy, adenoidectomy, bilateral myringotomy and tubes    . Inguinal hernia repair      bilateral  . Knee arthroscopy      left   . Biopsy of right ear    . Cholecystectomy    . Cataract extraction, bilateral    . Tonsillectomy and adenoidectomy    . Appendectomy    . Inguinal hernia repair      Bilateral    Family History  Problem Relation Age of Onset  . Hyperlipidemia Mother   . Hypertension Mother   . Deep vein thrombosis Mother   . Prostate cancer Father   . Colon cancer Sister   . Cancer Father 79  . Pulmonary embolism Mother   . Diabetes Mother     Social History [name] reports that he quit smoking about 10 years ago. His smoking use included Cigarettes, Pipe, and Cigars. He smoked 0.00 packs per day. He quit smokeless tobacco use about 10 years ago. [name] reports that he does not drink alcohol.  Review of  Systems CONSTITUTIONAL: No weight loss, fever, chills, weakness or fatigue.  HEENT: Eyes: No visual loss, blurred vision, double vision or yellow sclerae. No hearing loss, sneezing, congestion, runny nose or sore throat.  SKIN: No rash or itching.  CARDIOVASCULAR: per HPI RESPIRATORY: +SOB.  GASTROINTESTINAL: No anorexia, nausea, vomiting or diarrhea. No abdominal pain or blood.  GENITOURINARY: no polyuria, no dysuria NEUROLOGICAL: No headache, dizziness, syncope, paralysis, ataxia, numbness or tingling in the extremities. No change in bowel or bladder control.  MUSCULOSKELETAL: No muscle, back pain, joint pain or stiffness.  HEMATOLOGIC: No anemia, bleeding or bruising.  LYMPHATICS: No enlarged nodes. No history of splenectomy.  PSYCHIATRIC: No history of depression or anxiety.      Physical Examination Blood pressure 119/69, pulse 59, temperature 97.9 F (36.6 C), temperature source Oral, resp. rate 13, height 5\' 8"  (1.727 m), weight 220 lb 0.3 oz (99.8 kg), SpO2 94.00%.  Intake/Output Summary (Last 24 hours) at 07/13/14 1131 Last data filed at 07/13/14 0916  Gross per 24 hour  Intake    300 ml  Output      0 ml  Net    300 ml    HEENT: Sclera clear  Cardiovascular: RRR, no m/r/g, no JVD  Respiratory: CTAB  GI: abdomen soft, NT, ND  MSK: no LE edema  Neuro: no focal deficits  Psych: appropriate affect   Lab Results  Basic Metabolic Panel:  Recent Labs Lab 07/12/14 1449 07/13/14 0536  NA 140 142  K 4.1 4.3  CL 101 103  CO2 29 31  GLUCOSE 135* 98  BUN 16 16  CREATININE 1.31 1.33  CALCIUM 8.8 8.7    Liver Function Tests:  Recent Labs Lab 07/13/14 0536  AST 16  ALT 13  ALKPHOS 58  BILITOT 0.4  PROT 6.1  ALBUMIN 3.2*    CBC:  Recent Labs Lab 07/12/14 1449 07/13/14 0536  WBC 5.0 5.0  HGB 12.7* 12.2*  HCT 36.9* 36.4*  MCV 87.6 88.1  PLT 138* 133*    Cardiac Enzymes:  Recent Labs Lab 07/12/14 1449 07/12/14 1736 07/12/14 2253  07/13/14 0540  TROPONINI <0.30 <0.30 <0.30 <0.30    BNP: No components found with this basename: POCBNP,    ECG   Imaging Cath 09/2002 FINDINGS:  1. Left main trunk: Large caliber vessel  with mild irregularities.  2. LAD: This is a medium caliber vessel that provides a major first  diagonal [name] in its proximal segment before extending in the apex.  The LAD has mild disease of 30-40% in the mid section. The diagonal  Zekiel Torian has moderate disease of 30-40% as well.  3. Left circumflex artery: This is a medium caliber vessel that provides  four marginal branches in the mid section. There is diffuse disease of  30-40% in the left circumflex system.  4. Ramus intermedius: This is a small caliber vessel that provides the  anterolateral wall. The ramus vessel has mild disease.  5. Right coronary artery: Dominant. This is a large caliber vessel that  provides the posterior descending artery and posterior ventricular Hodari Chuba  in its terminal segment. The right coronary artery has mild diffuse  disease of not greater than 30% in the mid section.  LEFT VENTRICULOGRAM: Normal end-systolic and end-diastolic dimensions.  Overall, left ventricular function is well preserved, ejection fraction of  greater than 65%. No mitral regurgitation. LV pressure is 140/10, aortic  is 140/90, LVEDP equals 20.  ASSESSMENT AND PLAN: The patient is a 80 year old gentleman with  noncritical coronary artery disease and well preserved left ventricular  function. Continued medical therapy will be pursued and other causes of  chest pain investigated.  Cath 12/2003 FINDINGS:  1. Right heart catheterization:  a. RA equals 17/15; mean equals 15.  b. RV equals 35/14.  c. PA equals 35/22.  d. Pulmonary capillary wedge pressure is 24/22; mean equals 21.  e. Cardiac output was 5.3 L/minute with cardiac index of 2.4 L/minute/sq  m by Fick method.  f. PA saturation 63%.  g. Aortic saturation 90%.  h. Hemoglobin  is 13.0 mg/dl.  2. Left heart catheterization:  a. Left main trunk: Large caliber vessel with mild irregularities.  b. LAD: This is a medium caliber vessel. Provides two large diagonal  branches in the proximal segment. The LAD has moderate disease of 30-  40% in the proximal to mid section with calcification. The distal LAD  has diffuse disease of 30%. The first diagonal Rainn Bullinger has mild  disease of 30%. The second diagonal Everley Evora has an ostial narrowing of  40%.  c. Left circumflex artery: This is a medium caliber vessel. Provides  three small marginal branches in the mid section. Left circumflex  system has mild irregularities.  d. Right coronary artery: Dominant. This is a medium caliber vessel.  Provides posterior descending artery and small posterior ventricular  Arend Bahl in terminal segment. The right coronary artery has moderate  tubular narrowing of 40% in the proximal segment with further disease  of 30% in the mid and distal sections.  e. LV: Normal end-systolic and end-diastolic dimensions. Overall left  ventricular function is well preserved. Ejection fraction greater  than 55%. No mitral regurgitation. LV pressure is 119/7. Aortic is  122/74. LVEDP equals 13.  ASSESSMENT/PLAN: [name] is a 80 year old gentleman with noncritical  coronary artery disease and well preserved left ventricular function. He  has mild pulmonary hypertension and history of sleep apnea. Continued  medical therapy will be recommended.  09/2008 Echo LEFT VENTRICLE: - Left ventricular size was normal. - Overall left ventricular systolic function was normal. - Left ventricular ejection fraction was estimated , range being 55 % to 60 %. - Although no diagnostic left ventricular regional wall motion abnormality was identified, this possibility cannot be completely excluded on the basis of this study. - Left ventricular wall thickness  was mildly increased.  Doppler interpretation(s): - Features  were consistent with mild diastolic dysfunction.  AORTIC VALVE: - The aortic valve was trileaflet. - The aortic valve was mildly calcified. - There was lower normal aortic valve leaflet excursion.  Doppler interpretation(s): - There was no significant aortic valvular regurgitation.  MITRAL VALVE: Doppler interpretation(s): - There was trivial mitral valvular regurgitation.  LEFT ATRIUM: - Left atrial size was normal.  RIGHT VENTRICLE: - The right ventricle was mildly dilated. - Right ventricular systolic function was normal.  PULMONIC VALVE: - The pulmonic valve was grossly normal. - The pulmonic valve was not well visualized.  TRICUSPID VALVE: - The tricuspid valve was grossly normal.  Doppler interpretation(s): - There was trivial tricuspid valvular regurgitation.  PULMONARY ARTERY: Doppler interpretation(s): - Estimated peak pulmonary artery systolic pressure could not be determined.  RIGHT ATRIUM: - Right atrial size was normal.  SYSTEMIC VEINS: - The inferior vena cava was normal.  PERICARDIUM: - There was a minimal pericardial effusion, without hemodynamic compromise.  ---------------------------------------------------------------  SUMMARY - Overall left ventricular systolic function was normal. Left ventricular ejection fraction was estimated , range being 55 % to 60 %. Although no diagnostic left ventricular regional wall motion abnormality was identified, this possibility cannot be completely excluded on the basis of this study. Left ventricular wall thickness was mildly increased. Features were consistent with mild diastolic dysfunction. - The aortic valve was mildly calcified. There was lower normal aortic valve leaflet excursion. - The right ventricle was mildly dilated.    Impression/Recommendations  1. Chest pain - known mild to moderate CAD from cath 2005, last ischemic 2012 MPI was negative - multiple CAD risk factors including DM,  HTN, HL - no evidence of ACS by cardiac enzymes or EKG - overall concern for possible progression of known CAD, will obtain echo and Lexiscan MPI for further evaluation. He cannot run on treadmill due to chronic hip pain - continue ASA, atorva, ARB.  - add fasting lipid panel to AM labs  Carlyle Dolly, M.D., F.A.C.C.

## 2014-07-13 NOTE — Care Management Note (Signed)
    Page 1 of 1   07/14/2014     2:51:51 PM CARE MANAGEMENT NOTE 07/14/2014  Patient:  [name], [name]   Account Number:  [id number]  Date Initiated:  07/13/2014  Documentation initiated by:  Vladimir Creeks  Subjective/Objective Assessment:   Admitted with chest pain. patient is from home, lives alone, but has 2 daughters who assist as needed, and he says, [place] he will be returning home at D/C.     Action/Plan:   Having a stress test tomorrow, and will  D/C home if negative, and go to [facility] if cath needed   Anticipated DC Date:  07/14/2014   Anticipated DC Plan:  [place]  CM consult      Choice offered to / List presented to:             Status of service:  Completed, signed off Medicare Important Message given?   (If response is "NO", the following Medicare IM given date fields will be blank) Date Medicare IM given:   Medicare IM given by:   Date Additional Medicare IM given:   Additional Medicare IM given by:    Discharge Disposition:  HOME/SELF CARE  Per UR Regulation:  Reviewed for med. necessity/level of care/duration of stay  If discussed at [place] of Stay Meetings, dates discussed:    Comments:  07/14/14 1445 Vladimir Creeks RN/CM Stress test negative and pt D/C home 07/13/14 1530 [facility] RN/CM

## 2014-07-13 NOTE — Progress Notes (Signed)
Utilization review completed.  

## 2014-07-13 NOTE — Progress Notes (Signed)
PROGRESS NOTE  [name] NFA:[id number] DOB: 1930/05/13 DOA: 07/12/2014 PCP: Anthoney Harada, MD  HPI: [name] is a 80 y.o. male with known CAD presenting with chest pain, pressure-like when he was in the grocery store on 7/14.  Subjective: - feeling better, no chest pain currently   Assessment/Plan: Chest pain  - troponin negative x 3, chest pain free this morning, cardiology consulted, appreciate further input.  DM - A1C 6.0 in march 2015, continue metformin/insulin HTN - controlled CAD - with chest pain on presentation HLD Thrombocytopenia - chronic, stable, no bleeding  Diet: carb modified Fluids: none DVT Prophylaxis: heparin  Code Status: Full Family Communication: d/w patient  Disposition Plan: home when ready   Consultants:  Cardiology   Procedures:  None    Antibiotics - none   Objective: Filed Vitals:   07/12/14 1736 07/12/14 1821 07/12/14 1944 07/13/14 0709  BP: 140/82  115/62 119/69  Pulse: 76 74 69 59  Temp:   97.8 F (36.6 C) 97.9 F (36.6 C)  TempSrc:   Oral Oral  Resp: 21 21 20 13   Height:  5\' 8"  (1.727 m)    Weight:  99.8 kg (220 lb 0.3 oz)    SpO2: 91% 96% 95% 94%    Intake/Output Summary (Last 24 hours) at 07/13/14 1024 Last data filed at 07/13/14 0916  Gross per 24 hour  Intake    300 ml  Output      0 ml  Net    300 ml   Filed Weights   07/12/14 1421 07/12/14 1821  Weight: 97.523 kg (215 lb) 99.8 kg (220 lb 0.3 oz)   Exam:  General:  NAD  Cardiovascular: regular rate and rhythm  Respiratory: good air movement, clear to auscultation throughout, no wheezing  Abdomen: soft, not tender to palpation, positive bowel sounds  MSK: no peripheral edema  Data Reviewed: Basic Metabolic Panel:  Recent Labs Lab 07/12/14 1449 07/13/14 0536  NA 140 142  K 4.1 4.3  CL 101 103  CO2 29 31  GLUCOSE 135* 98  BUN 16 16  CREATININE 1.31 1.33  CALCIUM 8.8 8.7   Liver Function Tests:  Recent Labs Lab  07/13/14 0536  AST 16  ALT 13  ALKPHOS 58  BILITOT 0.4  PROT 6.1  ALBUMIN 3.2*   CBC:  Recent Labs Lab 07/12/14 1449 07/13/14 0536  WBC 5.0 5.0  HGB 12.7* 12.2*  HCT 36.9* 36.4*  MCV 87.6 88.1  PLT 138* 133*   Cardiac Enzymes:  Recent Labs Lab 07/12/14 1449 07/12/14 1736 07/12/14 2253 07/13/14 0540  TROPONINI <0.30 <0.30 <0.30 <0.30   CBG:  Recent Labs Lab 07/12/14 1428 07/12/14 1644 07/12/14 2053 07/13/14 0724  GLUCAP 134* 116* 137* 110*   Studies: Dg Chest Portable 1 View  07/12/2014   CLINICAL DATA:  Chest pain.  EXAM: PORTABLE CHEST - 1 VIEW  COMPARISON:  Portable chest 09/25/2008.  CT chest 01/07/2004.  FINDINGS: Lungs clear. Heart size normal. No pneumothorax or pleural effusion.  IMPRESSION: No acute disease.   Electronically Signed   By: Inge Rise M.D.   On: 07/12/2014 15:05    Scheduled Meds: . amLODipine  5 mg Oral Daily  . aspirin  81 mg Oral Daily  . atorvastatin  10 mg Oral q1800  . cholecalciferol  2,000 Units Oral BID  . citalopram  20 mg Oral Daily  . [START ON 07/14/2014] fentaNYL  50 mcg Transdermal Q72H  . finasteride  5 mg  Oral Daily  . furosemide  40 mg Oral Daily  . heparin  5,000 Units Subcutaneous 3 times per day  . insulin aspart  0-20 Units Subcutaneous TID WC  . insulin aspart  0-5 Units Subcutaneous QHS  . losartan  50 mg Oral Daily  . metFORMIN  500 mg Oral Q breakfast  . omega-3 acid ethyl esters  1 g Oral Daily  . pantoprazole  40 mg Oral Daily  . terazosin  2 mg Oral QHS  . vitamin B-12  1,000 mcg Oral Daily   Continuous Infusions:   Active Problems:   HTN (hypertension)   Diabetes mellitus, type 2   Prostate cancer   Hyperlipidemia   CAD (coronary artery disease)   Chest pain   Time spent: 25  This note has been created with Surveyor, quantity. Any transcriptional errors are unintentional.   Marzetta Board, MD [facility] Pager [phone]. If 7 PM -  7 AM, please contact night-coverage at [email or web address], password [facility] 07/13/2014, 10:24 AM  LOS: 1 day

## 2014-07-14 ENCOUNTER — Observation Stay (HOSPITAL_COMMUNITY): Payer: Medicare PPO

## 2014-07-14 ENCOUNTER — Ambulatory Visit: Payer: Medicare HMO | Admitting: Nurse Practitioner

## 2014-07-14 ENCOUNTER — Encounter (HOSPITAL_COMMUNITY): Payer: Self-pay

## 2014-07-14 LAB — GLUCOSE, CAPILLARY
GLUCOSE-CAPILLARY: 128 mg/dL — AB (ref 70–99)
GLUCOSE-CAPILLARY: 99 mg/dL (ref 70–99)
Glucose-Capillary: 128 mg/dL — ABNORMAL HIGH (ref 70–99)

## 2014-07-14 LAB — LIPID PANEL
Cholesterol: 149 mg/dL (ref 0–200)
HDL: 51 mg/dL (ref >39)
LDL CALC: 72 mg/dL (ref 0–99)
Total CHOL/HDL Ratio: 2.9 RATIO
Triglycerides: 131 mg/dL (ref <150)
VLDL: 26 mg/dL (ref 0–40)

## 2014-07-14 MED ORDER — REGADENOSON 0.4 MG/5ML IV SOLN
0.4000 mg | Freq: Once | INTRAVENOUS | Status: AC
  Administered 2014-07-14: 0.4 mg via INTRAVENOUS
  Filled 2014-07-14: qty 5

## 2014-07-14 MED ORDER — REGADENOSON 0.4 MG/5ML IV SOLN
INTRAVENOUS | Status: AC
  Administered 2014-07-14: 0.4 mg via INTRAVENOUS
  Filled 2014-07-14: qty 5

## 2014-07-14 MED ORDER — TECHNETIUM TC 99M SESTAMIBI GENERIC - CARDIOLITE
30.0000 | Freq: Once | INTRAVENOUS | Status: AC | PRN
  Administered 2014-07-14: 33 via INTRAVENOUS

## 2014-07-14 MED ORDER — SODIUM CHLORIDE 0.9 % IJ SOLN
INTRAMUSCULAR | Status: AC
  Administered 2014-07-14: 10 mL via INTRAVENOUS
  Filled 2014-07-14: qty 10

## 2014-07-14 MED ORDER — TECHNETIUM TC 99M SESTAMIBI - CARDIOLITE
10.0000 | Freq: Once | INTRAVENOUS | Status: AC | PRN
  Administered 2014-07-14: 10 via INTRAVENOUS

## 2014-07-14 NOTE — Discharge Instructions (Signed)

## 2014-07-14 NOTE — Progress Notes (Signed)
Pt NPO. Nurse explained to pt about meaning. Pt verbalizes understanding.

## 2014-07-14 NOTE — Discharge Summary (Signed)
Physician Discharge Summary  [name] WJX:[id number] DOB: [birth year] DOA: 07/12/2014  PCP: Anthoney Harada, MD  Admit date: 07/12/2014 Discharge date: 07/14/2014  Time spent: 35 minutes  Recommendations for Outpatient Follow-up:  1. Follow up with Dr. Jacelyn Grip in 1-2 weeks   Recommendations for primary care physician for things to follow:  Post hospitalization follow up  Discharge Diagnoses:  Active Problems:   HTN (hypertension)   Diabetes mellitus, type 2   Prostate cancer   Hyperlipidemia   CAD (coronary artery disease)   Chest pain  Discharge Condition: stable  Diet recommendation: heart healthy  Filed Weights   07/12/14 1421 07/12/14 1821  Weight: 97.523 kg (215 lb) 99.8 kg (220 lb 0.3 oz)   History of present illness:  This 80 year old man, who has documented coronary artery disease, presents with an episode of chest pain which started at 1 PM when he was in the grocery store today. The pain was central, heavy/dull in nature and was associated with degree of dyspnea. There is no nausea, vomiting or sweating. The pain did not radiate anywhere else. He has never had this kind of pain before. EMS was called and the pain gradually wore off over the course of 35-45 minutes. He is now currently pain-free. He is diabetic, hypertensive and previously a smoker. He has hyperlipidemia. He is now being admitted for further investigation.  Hospital Course:  Patient admitted with chief complaint of chest pain. He has multiple risk factors thus cardiology has been consulted and followed patient while hospitalized. He underwent serial cardiac enzymes which were negative. EKG appeared non ischemic and patient was chest pain free during his hospitalization. Per cardiology he underwent a stress test on 07/14/2014 which was essentially normal with LVEF of 70% (full result below). It seems like his chest pain was non cardiac in nature and cardiology cleared him for discharge. Advised to  follow up as an outpatient in few months with Cardiology and to see his PCP in 1-2 weeks for regular post hospitalization stay.   Procedures:  Stress test    Consultations:  Cardiology   Discharge Exam: Filed Vitals:   07/13/14 0709 07/13/14 1457 07/13/14 2014 07/14/14 0502  BP: 119/69 107/69 122/70 128/76  Pulse: 59 60 57 57  Temp: 97.9 F (36.6 C) 98 F (36.7 C) 98.3 F (36.8 C) 98.6 F (37 C)  TempSrc: Oral Oral Oral Oral  Resp: 13 18 18 18   Height:      Weight:      SpO2: 94% 92% 96% 98%   General: NAD Cardiovascular: RRR Respiratory: CTA biL  Discharge Instructions     Medication List         amLODipine 5 MG tablet  Commonly known as:  NORVASC  Take 1 tablet (5 mg total) by mouth daily.     ASPIRIN CHILDRENS 81 MG chewable tablet  Generic drug:  aspirin  Chew 81 mg by mouth daily.     citalopram 20 MG tablet  Commonly known as:  CELEXA  TAKE 1 TABLET DAILY     fentaNYL 50 MCG/HR  Commonly known as:  DURAGESIC  Place 1 patch (50 mcg total) onto the skin every 3 (three) days.     finasteride 5 MG tablet  Commonly known as:  PROSCAR  Take 1 tablet (5 mg total) by mouth daily.     Fish Oil 1200 MG Caps  Take 1 capsule by mouth 5 (five) times daily.     furosemide 40 MG tablet  Commonly known as:  LASIX  TAKE 1 TABLET DAILY     losartan 50 MG tablet  Commonly known as:  COZAAR  TAKE 1 TABLET DAILY     metFORMIN 500 MG 24 hr tablet  Commonly known as:  GLUCOPHAGE-XR  Take 1 tablet (500 mg total) by mouth daily with breakfast.     omeprazole 20 MG capsule  Commonly known as:  PRILOSEC  Take 1 capsule (20 mg total) by mouth daily.     oxyCODONE-acetaminophen 5-325 MG per tablet  Commonly known as:  PERCOCET/ROXICET  Take 1 tablet by mouth daily as needed for moderate pain or severe pain.     rosuvastatin 5 MG tablet  Commonly known as:  CRESTOR  Take 5 mg by mouth at bedtime.     STOOL SOFTENER 100 MG capsule  Generic drug:  docusate  sodium  Take 300 mg by mouth 2 (two) times daily as needed for mild constipation.     terazosin 2 MG capsule  Commonly known as:  HYTRIN  TAKE 1 CAPSULE AT BEDTIME     vitamin B-12 1000 MCG tablet  Commonly known as:  CYANOCOBALAMIN  Take 1,000 mcg by mouth daily.     Vitamin D3 2000 UNITS capsule  Take 2,000 Units by mouth 2 (two) times daily.           Follow-up Information   Follow up with Anthoney Harada, MD. Schedule an appointment as soon as possible for a visit in 1 week.   Specialty:  Family Medicine   Contact information:   [place] [address] [phone]       The results of significant diagnostics from this hospitalization (including imaging, microbiology, ancillary and laboratory) are listed below for reference.    Significant Diagnostic Studies: Nm Myocar Single W/spect W/wall Motion And Ef  07/14/2014   CLINICAL DATA:  80 year old male with a known history of nonobstructive coronary artery disease referred for chest pain.  EXAM: MYOCARDIAL IMAGING WITH SPECT (REST AND PHARMACOLOGIC-STRESS)  GATED LEFT VENTRICULAR WALL MOTION STUDY  LEFT VENTRICULAR EJECTION FRACTION  TECHNIQUE: Standard myocardial SPECT imaging was performed after resting intravenous injection of 10 mCi Tc-58m sestamibi. Subsequently, intravenous infusion of Lexiscan was performed under the supervision of the Cardiology staff. At peak effect of the drug, 30 mCi Tc-62m sestamibi was injected intravenously and standard myocardial SPECT imaging was performed. Quantitative gated imaging was also performed to evaluate left ventricular wall motion, and estimate left ventricular ejection fraction.  COMPARISON:  None.  FINDINGS: Pharmacological stress  Baseline EKG showed normal sinus rhythm. After injection heart rate increased from 71 beats per min up to 99 beats per min and blood pressure increased from 110/58 up to 140/62. The test was stopped after injection was complete, the  patient did not experience any chest pain.  Post-injection EKG showed no specific ischemic changes and no significant arrhythmias.  Myocardial perfusion imaging  Raw images showed appropriate radiotracer uptake. There is a moderate-sized mild intensity inferior fixed defect in the resting and post-injection images. The inferior wall has normal wall motion. Overall these findings most consistent with sub- diaphragmatic attenuation. There are no other myocardial perfusion defects.  Gated imaging shows end systolic volume 80 mL, end systolic volume 24 mL, left ventricular ejection fraction 70%. There was normal wall motion and thickening.  IMPRESSION: 1.  Negative Lexiscan MPI for ischemia  2.  Normal LV systolic function, LVEF 03%  3.  Low risk study for major cardiac  events   Electronically Signed   By: Carlyle Dolly   On: 07/14/2014 11:13   US Venous Img Lower Unilateral Left  06/23/2014   CLINICAL DATA:  Left lower extremity pain and edema  EXAM: LEFT LOWER EXTREMITY VENOUS DOPPLER ULTRASOUND  TECHNIQUE: Gray-scale sonography with graded compression, as well as color Doppler and duplex ultrasound were performed to evaluate the lower extremity deep venous systems from the level of the common femoral vein and including the common femoral, femoral, profunda femoral, popliteal and calf veins including the posterior tibial, peroneal and gastrocnemius veins when visible. The superficial great saphenous vein was also interrogated. Spectral Doppler was utilized to evaluate flow at rest and with distal augmentation maneuvers in the common femoral, femoral and popliteal veins.  COMPARISON:  None.  FINDINGS: Common Femoral Vein: No evidence of thrombus. Normal compressibility, respiratory phasicity and response to augmentation.  Saphenofemoral Junction: No evidence of thrombus. Normal compressibility and flow on color Doppler imaging.  Profunda Femoral Vein: No evidence of thrombus. Normal compressibility and flow on  color Doppler imaging.  Femoral Vein: No evidence of thrombus. Normal compressibility, respiratory phasicity and response to augmentation.  Popliteal Vein: No evidence of thrombus. Normal compressibility, respiratory phasicity and response to augmentation.  Calf Veins: No evidence of thrombus. Normal compressibility and flow on color Doppler imaging.  Superficial Great Saphenous Vein: No evidence of thrombus. Normal compressibility and flow on color Doppler imaging.  Venous Reflux:  None.  Other Findings:  None.  IMPRESSION: No evidence of deep venous thrombosis.   Electronically Signed   By: Daryll Brod M.D.   On: 06/23/2014 16:24   Dg Chest Portable 1 View  07/12/2014   CLINICAL DATA:  Chest pain.  EXAM: PORTABLE CHEST - 1 VIEW  COMPARISON:  Portable chest 09/25/2008.  CT chest 01/07/2004.  FINDINGS: Lungs clear. Heart size normal. No pneumothorax or pleural effusion.  IMPRESSION: No acute disease.   Electronically Signed   By: Inge Rise M.D.   On: 07/12/2014 15:05   Labs: Basic Metabolic Panel:  Recent Labs Lab 07/12/14 1449 07/13/14 0536  NA 140 142  K 4.1 4.3  CL 101 103  CO2 29 31  GLUCOSE 135* 98  BUN 16 16  CREATININE 1.31 1.33  CALCIUM 8.8 8.7   Liver Function Tests:  Recent Labs Lab 07/13/14 0536  AST 16  ALT 13  ALKPHOS 58  BILITOT 0.4  PROT 6.1  ALBUMIN 3.2*   CBC:  Recent Labs Lab 07/12/14 1449 07/13/14 0536  WBC 5.0 5.0  HGB 12.7* 12.2*  HCT 36.9* 36.4*  MCV 87.6 88.1  PLT 138* 133*   Cardiac Enzymes:  Recent Labs Lab 07/12/14 1449 07/12/14 1736 07/12/14 2253 07/13/14 0540  TROPONINI <0.30 <0.30 <0.30 <0.30   CBG:  Recent Labs Lab 07/13/14 1126 07/13/14 1627 07/13/14 2018 07/14/14 0730 07/14/14 1200  GLUCAP 164* 94 128* 99 128*   Signed:  Correll Denbow  [facility] 07/14/2014, 1:55 PM

## 2014-07-14 NOTE — Progress Notes (Signed)
Stress Lab Nurses Notes - [name]  [name] 07/14/2014 Reason for doing test: CAD and chest tightness & SOB Type of test: [facility]/ Inpatient Rm 322 Nurse performing test: Gerrit Halls, RN Nuclear Medicine Tech: Melburn Hake Echo Tech: Not Applicable MD performing test: Branch/K.Purcell Nails NP Family MD: Laurance Flatten Test explained and consent signed: Yes.   IV started: No redness or edema and Saline lock from floor Symptoms: nausea  & flush Treatment/Intervention: None Reason test stopped: protocol completed After recovery IV was: No redness or edema and Saline Lock flushed Patient to return to [place]. Med at : Patient discharged: Transported back to room 322 via wc Patient's Condition upon discharge was: stable Comments: During test BP 110/50 & HR 92 .  Recovery BP 140/62  & HR 82.  Symptoms resolved in recovery. Geanie Cooley T

## 2014-07-14 NOTE — Progress Notes (Signed)
Consulting cardiologist: Carlyle Dolly MD Primary Cardiologist: New- Laurencia Roma  Subjective:    No complaints of chest pain overnight. (Pt examined prior to stress test in lab).  Objective:   Temp:  [98 F (36.7 C)-98.6 F (37 C)] 98.6 F (37 C) (07/16 0502) Pulse Rate:  [57-60] 57 (07/16 0502) Resp:  [18] 18 (07/16 0502) BP: (107-128)/(69-76) 128/76 mmHg (07/16 0502) SpO2:  [92 %-98 %] 98 % (07/16 0502) Last BM Date: 07/11/14  Filed Weights   07/12/14 1421 07/12/14 1821  Weight: 215 lb (97.523 kg) 220 lb 0.3 oz (99.8 kg)    Intake/Output Summary (Last 24 hours) at 07/14/14 0844 Last data filed at 07/13/14 1455  Gross per 24 hour  Intake    540 ml  Output      0 ml  Net    540 ml    Telemetry: NSR  Exam:  General: No acute distress. Hard of hearing  HEENT: Conjunctiva and lids normal, oropharynx clear.  Lungs: Clear to auscultation, nonlabored.  Cardiac: No elevated JVP or bruits. RRR, no gallop or rub.   Abdomen: Normoactive bowel sounds, nontender, nondistended.  Extremities: No pitting edema, distal pulses full.  Neuropsychiatric: Alert and oriented x3, affect appropriate.   Lab Results:  Basic Metabolic Panel:  Recent Labs Lab 07/12/14 1449 07/13/14 0536  NA 140 142  K 4.1 4.3  CL 101 103  CO2 29 31  GLUCOSE 135* 98  BUN 16 16  CREATININE 1.31 1.33  CALCIUM 8.8 8.7    Liver Function Tests:  Recent Labs Lab 07/13/14 0536  AST 16  ALT 13  ALKPHOS 58  BILITOT 0.4  PROT 6.1  ALBUMIN 3.2*    CBC:  Recent Labs Lab 07/12/14 1449 07/13/14 0536  WBC 5.0 5.0  HGB 12.7* 12.2*  HCT 36.9* 36.4*  MCV 87.6 88.1  PLT 138* 133*    Cardiac Enzymes:  Recent Labs Lab 07/12/14 1736 07/12/14 2253 07/13/14 0540  TROPONINI <0.30 <0.30 <0.30       Radiology: Dg Chest Portable 1 View  07/12/2014   CLINICAL DATA:  Chest pain.  EXAM: PORTABLE CHEST - 1 VIEW  COMPARISON:  Portable chest 09/25/2008.  CT chest 01/07/2004.   FINDINGS: Lungs clear. Heart size normal. No pneumothorax or pleural effusion.  IMPRESSION: No acute disease.   Electronically Signed   By: Inge Rise M.D.   On: 07/12/2014 15:05     Medications:   Scheduled Medications: . amLODipine  5 mg Oral Daily  . aspirin  81 mg Oral Daily  . atorvastatin  10 mg Oral q1800  . cholecalciferol  2,000 Units Oral BID  . citalopram  20 mg Oral Daily  . fentaNYL  50 mcg Transdermal Q72H  . finasteride  5 mg Oral Daily  . furosemide  40 mg Oral Daily  . heparin  5,000 Units Subcutaneous 3 times per day  . insulin aspart  0-20 Units Subcutaneous TID WC  . insulin aspart  0-5 Units Subcutaneous QHS  . losartan  50 mg Oral Daily  . metFORMIN  500 mg Oral Q breakfast  . omega-3 acid ethyl esters  1 g Oral Daily  . pantoprazole  40 mg Oral Daily  . terazosin  2 mg Oral QHS  . vitamin B-12  1,000 mcg Oral Daily    Infusions:    PRN Medications: docusate sodium, ondansetron (ZOFRAN) IV, ondansetron, oxyCODONE-acetaminophen, technetium sestamibi generic   Assessment and Plan:   1. CAD: Non-obstructive per cardiac  cath in 2003. Denies recurrent chest pain since admission. Lexiscan stress test completed this am. No acute ST-T wave changes are noted, no chest pain. Awaiting scintigraphy for further recommendations. Continue current regimen of statin, ASA and ARB.  2. Hypercholesterolemia: Cholesterol and LDL are at good levels. Continue lipitor.   3. Hypertension: Grade I diastolic dysfunction with normal EF per echo on 07/13/2014. Continue antihypertensive regimen. He is on lasix currently as well. May need to cut back as he has normal LV fx, Creatinine 1.33 as he is also on NSAID. No edema.    Phill Myron. Lawrence NP  07/14/2014, 8:44 AM  Attending Note Patient seen and discussed with NP Purcell Nails. I agree with her documentation above. Negative Lexiscan MPI for ischemia. Echo with normal LVEF and no WMAs. Appears to be non-cardiac chest pain.  Continue risk factor modification and secondary prevention. [place] for discharge from cardiac standpoint, regular follow up in 3-4 months is fine. We will sign off of inpatient care.   Zandra Abts MD

## 2014-07-19 ENCOUNTER — Other Ambulatory Visit: Payer: Self-pay | Admitting: *Deleted

## 2014-07-19 DIAGNOSIS — N289 Disorder of kidney and ureter, unspecified: Secondary | ICD-10-CM

## 2014-07-19 MED ORDER — TERAZOSIN HCL 2 MG PO CAPS: ORAL_CAPSULE | ORAL | Status: DC

## 2014-07-20 ENCOUNTER — Other Ambulatory Visit: Payer: Self-pay | Admitting: Family Medicine

## 2014-07-20 ENCOUNTER — Telehealth: Payer: Self-pay | Admitting: Nurse Practitioner

## 2014-07-20 NOTE — Telephone Encounter (Signed)
Appt given per patients request 

## 2014-07-21 ENCOUNTER — Other Ambulatory Visit: Payer: Self-pay | Admitting: *Deleted

## 2014-07-21 MED ORDER — LOSARTAN POTASSIUM 50 MG PO TABS: ORAL_TABLET | ORAL | Status: DC

## 2014-07-21 MED ORDER — METFORMIN HCL ER 500 MG PO TB24: 500.0000 mg | ORAL_TABLET | Freq: Every day | ORAL | Status: DC

## 2014-07-25 ENCOUNTER — Encounter: Payer: Self-pay | Admitting: Nurse Practitioner

## 2014-07-25 ENCOUNTER — Ambulatory Visit (INDEPENDENT_AMBULATORY_CARE_PROVIDER_SITE_OTHER): Payer: Medicare HMO | Admitting: Nurse Practitioner

## 2014-07-25 VITALS — BP 122/72 | HR 73 | Temp 97.6°F | Ht 68.0 in | Wt 221.4 lb

## 2014-07-25 DIAGNOSIS — K449 Diaphragmatic hernia without obstruction or gangrene: Secondary | ICD-10-CM

## 2014-07-25 DIAGNOSIS — E1142 Type 2 diabetes mellitus with diabetic polyneuropathy: Secondary | ICD-10-CM

## 2014-07-25 DIAGNOSIS — E119 Type 2 diabetes mellitus without complications: Secondary | ICD-10-CM

## 2014-07-25 DIAGNOSIS — I10 Essential (primary) hypertension: Secondary | ICD-10-CM

## 2014-07-25 DIAGNOSIS — G8929 Other chronic pain: Secondary | ICD-10-CM

## 2014-07-25 DIAGNOSIS — N289 Disorder of kidney and ureter, unspecified: Secondary | ICD-10-CM

## 2014-07-25 DIAGNOSIS — E1149 Type 2 diabetes mellitus with other diabetic neurological complication: Secondary | ICD-10-CM

## 2014-07-25 DIAGNOSIS — Z713 Dietary counseling and surveillance: Secondary | ICD-10-CM

## 2014-07-25 DIAGNOSIS — E785 Hyperlipidemia, unspecified: Secondary | ICD-10-CM

## 2014-07-25 DIAGNOSIS — Z09 Encounter for follow-up examination after completed treatment for conditions other than malignant neoplasm: Secondary | ICD-10-CM

## 2014-07-25 DIAGNOSIS — Z6833 Body mass index (BMI) 33.0-33.9, adult: Secondary | ICD-10-CM

## 2014-07-25 DIAGNOSIS — I25810 Atherosclerosis of coronary artery bypass graft(s) without angina pectoris: Secondary | ICD-10-CM

## 2014-07-25 DIAGNOSIS — G4730 Sleep apnea, unspecified: Secondary | ICD-10-CM

## 2014-07-25 DIAGNOSIS — M549 Dorsalgia, unspecified: Secondary | ICD-10-CM

## 2014-07-25 LAB — POCT GLYCOSYLATED HEMOGLOBIN (HGB A1C): HEMOGLOBIN A1C: 6.2

## 2014-07-25 MED ORDER — FENTANYL 50 MCG/HR TD PT72: 50.0000 ug | MEDICATED_PATCH | TRANSDERMAL | Status: DC

## 2014-07-25 NOTE — Patient Instructions (Signed)

## 2014-07-25 NOTE — Progress Notes (Signed)
Subjective:    Patient ID: [name], male    DOB: [birth year], 80 y.o.   MRN: [id number]  Patient here today for follow up of chronic medical problems. Patient is also here for hospital follow up- he went to hospital last week with chest pain- was admitted for 2 nights- all test in heart were negative. He reports no chest pain since being in the hospital.  * SLeep apnea- has cpap machine but doesn't seem to be helping- needs to be titrated.  Hypertension This is a chronic problem. The current episode started more than 1 year ago. The problem is unchanged. The problem is controlled. Associated symptoms include chest pain. Pertinent negatives include no blurred vision, headaches, palpitations, peripheral edema, shortness of breath or sweats. There are no associated agents to hypertension. Risk factors for coronary artery disease include dyslipidemia, family history, male gender and obesity. Past treatments include calcium channel blockers, diuretics and angiotensin blockers. The current treatment provides significant improvement. Compliance problems include diet and exercise.  Hypertensive end-organ damage includes CAD/MI.  Hyperlipidemia This is a chronic problem. The current episode started more than 1 year ago. The problem is uncontrolled. Recent lipid tests were reviewed and are high. Exacerbating diseases include diabetes and obesity. He has no history of hypothyroidism. Associated symptoms include chest pain. Pertinent negatives include no shortness of breath. Current antihyperlipidemic treatment includes statins. The current treatment provides moderate improvement of lipids. Compliance problems include adherence to diet and adherence to exercise.  Risk factors for coronary artery disease include diabetes mellitus, dyslipidemia, family history, hypertension, male sex and obesity.  Diabetes He presents for his follow-up diabetic visit. He has type 2 diabetes mellitus. No MedicAlert  identification noted. Pertinent negatives for hypoglycemia include no headaches or sweats. Associated symptoms include chest pain. Pertinent negatives for diabetes include no blurred vision, no polydipsia, no polyphagia, no polyuria, no visual change and no weight loss. Risk factors for coronary artery disease include diabetes mellitus, dyslipidemia, family history, hypertension, male sex and obesity. Current diabetic treatment includes oral agent (monotherapy). He is compliant with treatment most of the time. His weight is stable. He is following a generally healthy diet. When asked about meal planning, he reported none. He has not had a previous visit with a dietician. He rarely participates in exercise. His breakfast blood glucose is taken between 8-9 am. His breakfast blood glucose range is generally 110-130 mg/dl. His highest blood glucose is 140-180 mg/dl. His overall blood glucose range is 110-130 mg/dl. An ACE inhibitor/angiotensin II receptor blocker is being taken. He does not see a podiatrist.Eye exam is current.  BPH currrently on hytrin and proscar- no trouble passing his water- sees urologist. depression celexa working well to keep him from worrying all the time. GERD/Hialtal hernia Omeprazole working well to keep symptoms under control B12 anemia injections monthly- no c/o fatigue Chronic back pain and neck pain Fentanyl patch and percocet- patient has been on for awhile - is not a candidate for surgery.     Review of Systems  Constitutional: Negative for weight loss.  Eyes: Negative for blurred vision.  Respiratory: Negative for shortness of breath.   Cardiovascular: Positive for chest pain. Negative for palpitations.  Endocrine: Negative for polydipsia, polyphagia and polyuria.  Neurological: Negative for headaches.  All other systems reviewed and are negative.      Objective:   Physical Exam  Constitutional: He is oriented to person, place, and time. He appears  well-developed and well-nourished.  HENT:  Head:  Normocephalic.  Right Ear: External ear normal.  Left Ear: External ear normal.  Nose: Nose normal.  Mouth/Throat: Oropharynx is clear and moist.  Eyes: EOM are normal. Pupils are equal, round, and reactive to light.  Neck: Normal range of motion. Neck supple. No JVD present. No thyromegaly present.  Cardiovascular: Normal rate, regular rhythm, normal heart sounds and intact distal pulses.  Exam reveals no gallop and no friction rub.   No murmur heard. Pulmonary/Chest: Effort normal and breath sounds normal. No respiratory distress. He has no wheezes. He has no rales. He exhibits no tenderness.  Shallow breathing  Abdominal: Soft. Bowel sounds are normal. He exhibits no mass. There is no tenderness.  Genitourinary:  Prostate exam deferred to urologist  Musculoskeletal: Normal range of motion. He exhibits no edema.  Lymphadenopathy:    He has no cervical adenopathy.  Neurological: He is alert and oriented to person, place, and time. No cranial nerve deficit.  Skin: Skin is warm and dry.  Psychiatric: He has a normal mood and affect. His behavior is normal. Judgment and thought content normal.    BP 122/72  Pulse 73  Temp(Src) 97.6 F (36.4 C) (Oral)  Ht '5\' 8"'  (1.727 m)  Wt 221 lb 6.4 oz (100.426 kg)  BMI 33.67 kg/m2  Results for orders placed in visit on 07/25/14  POCT GLYCOSYLATED HEMOGLOBIN (HGB A1C)      Result Value Ref Range   Hemoglobin A1C 6.2          Assessment & Plan:   1. Hospital discharge follow-up   2. Type 2 diabetes mellitus without complication   3. Essential hypertension   4. Hyperlipidemia   5. Diabetic polyneuropathy associated with type 2 diabetes mellitus   6. Coronary artery disease involving nonautologous biological coronary bypass graft without angina pectoris   7. Kidney disease   8. Sleep apnea   9. Hiatal hernia   10. Chronic back pain   11. BMI 33.0-33.9,adult   12. Weight loss  counseling, encounter for    Orders Placed This Encounter  Procedures  . BMP8+EGFR  . NMR, lipoprofile  . Ambulatory referral to Sleep Studies    Referral Priority:  Routine    Referral Type:  Consultation    Referral Reason:  Specialty Services Required    Number of Visits Requested:  1  . POCT glycosylated hemoglobin (Hb A1C)   Meds ordered this encounter  Medications  . fentaNYL (DURAGESIC) 50 MCG/HR    Sig: Place 1 patch (50 mcg total) onto the skin every 3 (three) days.    Dispense:  10 patch    Refill:  0    Do not fill till 08/03/14    Order Specific Question:  Supervising Provider    Answer:  Chipper Herb [[id number]]   Will have cpap evalutaed by sleep specialist Labs pending Health maintenance reviewed Diet and exercise encouraged Continue all meds Follow up  In 3 month   [place], FNP

## 2014-07-26 LAB — NMR, LIPOPROFILE
Cholesterol: 145 mg/dL (ref 100–199)
HDL CHOLESTEROL BY NMR: 53 mg/dL (ref >39)
HDL PARTICLE NUMBER: 30.9 umol/L (ref >=30.5)
LDL Particle Number: 1024 nmol/L — ABNORMAL HIGH (ref <1000)
LDL SIZE: 20.5 nm (ref >20.5)
LDLC SERPL CALC-MCNC: 70 mg/dL (ref 0–99)
LP-IR Score: 39 (ref <=45)
SMALL LDL PARTICLE NUMBER: 437 nmol/L (ref <=527)
TRIGLYCERIDES BY NMR: 110 mg/dL (ref 0–149)

## 2014-07-26 LAB — BMP8+EGFR
BUN / CREAT RATIO: 12 (ref 10–22)
BUN: 17 mg/dL (ref 8–27)
CALCIUM: 9.2 mg/dL (ref 8.6–10.2)
CO2: 26 mmol/L (ref 18–29)
CREATININE: 1.45 mg/dL — AB (ref 0.76–1.27)
Chloride: 100 mmol/L (ref 97–108)
GFR calc Af Amer: 52 mL/min/{1.73_m2} — ABNORMAL LOW (ref >59)
GFR, EST NON AFRICAN AMERICAN: 45 mL/min/{1.73_m2} — AB (ref >59)
Glucose: 208 mg/dL — ABNORMAL HIGH (ref 65–99)
POTASSIUM: 4.6 mmol/L (ref 3.5–5.2)
SODIUM: 141 mmol/L (ref 134–144)

## 2014-08-02 ENCOUNTER — Telehealth: Payer: Self-pay | Admitting: Nurse Practitioner

## 2014-08-03 MED ORDER — FENTANYL 50 MCG/HR TD PT72: 50.0000 ug | MEDICATED_PATCH | TRANSDERMAL | Status: DC

## 2014-08-03 NOTE — Telephone Encounter (Signed)
rx ready for pickup 

## 2014-08-03 NOTE — Telephone Encounter (Signed)
Patient aware.

## 2014-08-05 ENCOUNTER — Other Ambulatory Visit: Payer: Self-pay

## 2014-08-05 MED ORDER — FUROSEMIDE 40 MG PO TABS: ORAL_TABLET | ORAL | Status: DC

## 2014-09-01 ENCOUNTER — Telehealth: Payer: Self-pay | Admitting: Family Medicine

## 2014-09-14 ENCOUNTER — Other Ambulatory Visit: Payer: Self-pay | Admitting: Family Medicine

## 2014-10-03 ENCOUNTER — Telehealth: Payer: Self-pay | Admitting: Family Medicine

## 2014-10-03 MED ORDER — FENTANYL 50 MCG/HR TD PT72: 50.0000 ug | MEDICATED_PATCH | TRANSDERMAL | Status: DC

## 2014-10-03 NOTE — Telephone Encounter (Signed)
rx ready for pickup 

## 2014-10-04 NOTE — Telephone Encounter (Signed)
Patient aware.

## 2014-10-05 ENCOUNTER — Institutional Professional Consult (permissible substitution): Payer: Medicare Other | Admitting: Pulmonary Disease

## 2014-10-24 ENCOUNTER — Other Ambulatory Visit: Payer: Self-pay | Admitting: Family Medicine

## 2014-10-25 NOTE — Telephone Encounter (Signed)
Last seen 07/25/14 MMM 

## 2014-10-28 ENCOUNTER — Encounter: Payer: Self-pay | Admitting: Nurse Practitioner

## 2014-10-28 ENCOUNTER — Ambulatory Visit (INDEPENDENT_AMBULATORY_CARE_PROVIDER_SITE_OTHER): Payer: Medicare HMO | Admitting: Nurse Practitioner

## 2014-10-28 VITALS — BP 127/74 | HR 88 | Temp 98.7°F | Ht 68.0 in | Wt 231.0 lb

## 2014-10-28 DIAGNOSIS — Z23 Encounter for immunization: Secondary | ICD-10-CM

## 2014-10-28 DIAGNOSIS — I25810 Atherosclerosis of coronary artery bypass graft(s) without angina pectoris: Secondary | ICD-10-CM

## 2014-10-28 DIAGNOSIS — E119 Type 2 diabetes mellitus without complications: Secondary | ICD-10-CM

## 2014-10-28 DIAGNOSIS — E785 Hyperlipidemia, unspecified: Secondary | ICD-10-CM

## 2014-10-28 DIAGNOSIS — E041 Nontoxic single thyroid nodule: Secondary | ICD-10-CM

## 2014-10-28 DIAGNOSIS — I10 Essential (primary) hypertension: Secondary | ICD-10-CM

## 2014-10-28 DIAGNOSIS — I82409 Acute embolism and thrombosis of unspecified deep veins of unspecified lower extremity: Secondary | ICD-10-CM

## 2014-10-28 LAB — POCT GLYCOSYLATED HEMOGLOBIN (HGB A1C): Hemoglobin A1C: 6.9

## 2014-10-28 MED ORDER — METFORMIN HCL ER 500 MG PO TB24: 500.0000 mg | ORAL_TABLET | Freq: Every day | ORAL | Status: DC

## 2014-10-28 NOTE — Progress Notes (Signed)
° °  Subjective:    Patient ID: [name], male    DOB: May 03, 1930, 80 y.o.   MRN: [id number]  Hypertension This is a chronic problem.      Review of Systems     Objective:   Physical Exam        Assessment & Plan:

## 2014-10-28 NOTE — Patient Instructions (Signed)
Diabetes and Exercise Exercising regularly is important. It is not just about losing weight. It has many health benefits, such as:  Improving your overall fitness, flexibility, and endurance.  Increasing your bone density.  Helping with weight control.  Decreasing your body fat.  Increasing your muscle strength.  Reducing stress and tension.  Improving your overall health. People with diabetes who exercise gain additional benefits because exercise:  Reduces appetite.  Improves the body's use of blood sugar (glucose).  Helps lower or control blood glucose.  Decreases blood pressure.  Helps control blood lipids (such as cholesterol and triglycerides).  Improves the body's use of the hormone insulin by:  Increasing the body's insulin sensitivity.  Reducing the body's insulin needs.  Decreases the risk for heart disease because exercising:  Lowers cholesterol and triglycerides levels.  Increases the levels of good cholesterol (such as high-density lipoproteins [HDL]) in the body.  Lowers blood glucose levels. YOUR ACTIVITY PLAN  Choose an activity that you enjoy and set realistic goals. Your health care provider or diabetes educator can help you make an activity plan that works for you. Exercise regularly as directed by your health care provider. This includes:  Performing resistance training twice a week such as push-ups, sit-ups, lifting weights, or using resistance bands.  Performing 150 minutes of cardio exercises each week such as walking, running, or playing sports.  Staying active and spending no more than 90 minutes at one time being inactive. Even short bursts of exercise are good for you. Three 10-minute sessions spread throughout the day are just as beneficial as a single 30-minute session. Some exercise ideas include:  Taking the dog for a walk.  Taking the stairs instead of the elevator.  Dancing to your favorite song.  Doing an exercise  video.  Doing your favorite exercise with a friend. RECOMMENDATIONS FOR EXERCISING WITH TYPE 1 OR TYPE 2 DIABETES   Check your blood glucose before exercising. If blood glucose levels are greater than 240 mg/dL, check for urine ketones. Do not exercise if ketones are present.  Avoid injecting insulin into areas of the body that are going to be exercised. For example, avoid injecting insulin into:  The arms when playing tennis.  The legs when jogging.  Keep a record of:  Food intake before and after you exercise.  Expected peak times of insulin action.  Blood glucose levels before and after you exercise.  The type and amount of exercise you have done.  Review your records with your health care provider. Your health care provider will help you to develop guidelines for adjusting food intake and insulin amounts before and after exercising.  If you take insulin or oral hypoglycemic agents, watch for signs and symptoms of hypoglycemia. They include:  Dizziness.  Shaking.  Sweating.  Chills.  Confusion.  Drink plenty of water while you exercise to prevent dehydration or heat stroke. Body water is lost during exercise and must be replaced.  Talk to your health care provider before starting an exercise program to make sure it is safe for you. Remember, almost any type of activity is better than none. Document Released: 03/07/2004 Document Revised: 05/02/2014 Document Reviewed: 05/25/2013 Exit[employer] Patient Information 2015 [employer]. This information is not intended to replace advice given to you by your health care provider. Make sure you discuss any questions you have with your health care provider.  

## 2014-10-28 NOTE — Progress Notes (Signed)
Subjective:    Patient ID: Slayter DONTAVIS TSCHANTZ, male    DOB: 07-26-1930, 80 y.o.   MRN: [id number]  Patient here today for follow up of chronic medical problems.   Hypertension This is a chronic problem. The current episode started more than 1 year ago. The problem is unchanged. The problem is controlled. Associated symptoms include chest pain. Pertinent negatives include no blurred vision, headaches, palpitations, peripheral edema, shortness of breath or sweats. There are no associated agents to hypertension. Risk factors for coronary artery disease include dyslipidemia, family history, male gender and obesity. Past treatments include calcium channel blockers, diuretics and angiotensin blockers. The current treatment provides significant improvement. Compliance problems include diet and exercise.  Hypertensive end-organ damage includes CAD/MI.  Hyperlipidemia This is a chronic problem. The current episode started more than 1 year ago. The problem is uncontrolled. Recent lipid tests were reviewed and are high. Exacerbating diseases include diabetes and obesity. He has no history of hypothyroidism. Associated symptoms include chest pain. Pertinent negatives include no shortness of breath. Current antihyperlipidemic treatment includes statins. The current treatment provides moderate improvement of lipids. Compliance problems include adherence to diet and adherence to exercise.  Risk factors for coronary artery disease include diabetes mellitus, dyslipidemia, family history, hypertension, male sex and obesity.  Diabetes He presents for his follow-up diabetic visit. He has type 2 diabetes mellitus. No MedicAlert identification noted. Pertinent negatives for hypoglycemia include no headaches or sweats. Associated symptoms include chest pain. Pertinent negatives for diabetes include no blurred vision, no polydipsia, no polyphagia, no polyuria, no visual change and no weight loss. Risk factors for coronary  artery disease include diabetes mellitus, dyslipidemia, family history, hypertension, male sex and obesity. Current diabetic treatment includes oral agent (monotherapy). He is compliant with treatment most of the time. His weight is stable. He is following a generally healthy diet. When asked about meal planning, he reported none. He has not had a previous visit with a dietician. He rarely participates in exercise. His breakfast blood glucose is taken between 8-9 am. His breakfast blood glucose range is generally 110-130 mg/dl. His highest blood glucose is 140-180 mg/dl. His overall blood glucose range is 110-130 mg/dl. An ACE inhibitor/angiotensin II receptor blocker is being taken. He does not see a podiatrist.Eye exam is current.  BPH currrently on hytrin and proscar- no trouble passing his water- sees urologist. depression celexa working well to keep him from worrying all the time. GERD/Hialtal hernia Omeprazole working well to keep symptoms under control B12 anemia injections monthly- no c/o fatigue Chronic back pain and neck pain Fentanyl patch and percocet- patient has been on for awhile - is not a candidate for surgery.     Review of Systems  Constitutional: Negative for weight loss.  Eyes: Negative for blurred vision.  Respiratory: Negative for shortness of breath.   Cardiovascular: Positive for chest pain. Negative for palpitations.  Endocrine: Negative for polydipsia, polyphagia and polyuria.  Neurological: Negative for headaches.  All other systems reviewed and are negative.      Objective:   Physical Exam  Constitutional: He is oriented to person, place, and time. He appears well-developed and well-nourished.  HENT:  Head: Normocephalic.  Right Ear: External ear normal.  Left Ear: External ear normal.  Nose: Nose normal.  Mouth/Throat: Oropharynx is clear and moist.  Eyes: EOM are normal. Pupils are equal, round, and reactive to light.  Neck: Normal range of motion.  Neck supple. No JVD present. No thyromegaly present.  Cardiovascular: Normal  rate, regular rhythm, normal heart sounds and intact distal pulses.  Exam reveals no gallop and no friction rub.   No murmur heard. Pulmonary/Chest: Effort normal and breath sounds normal. No respiratory distress. He has no wheezes. He has no rales. He exhibits no tenderness.  Shallow breathing  Abdominal: Soft. Bowel sounds are normal. He exhibits no mass. There is no tenderness.  Genitourinary:  Prostate exam deferred to urologist  Musculoskeletal: Normal range of motion. He exhibits no edema.  Lymphadenopathy:    He has no cervical adenopathy.  Neurological: He is alert and oriented to person, place, and time. No cranial nerve deficit.  Skin: Skin is warm and dry.  Psychiatric: He has a normal mood and affect. His behavior is normal. Judgment and thought content normal.    BP 127/74  Pulse 88  Temp(Src) 98.7 F (37.1 C) (Oral)  Ht _0  (1.727 m)  Wt 231 lb (104.781 kg)  BMI 35.13 kg/m2  Results for orders placed in visit on 10/28/14  POCT GLYCOSYLATED HEMOGLOBIN (HGB A1C)      Result Value Ref Range   Hemoglobin A1C 6.9           Assessment & Plan:   1. Type 2 diabetes mellitus without complication Increased metformin to 2  day - POCT glycosylated hemoglobin (Hb A1C) - PSA, total and free  2. Hyperlipidemia Low fat diet  - NMR, lipoprofile  3. Essential hypertension Low sodium diet  - CMP14+EGFR  4. Coronary artery disease involving nonautologous biological coronary bypass graft without angina pectoris  5. Thyroid nodule   6. DVT (deep venous thrombosis), unspecified laterality   Labs pending Health maintenance reviewed Diet and exercise encouraged Continue all meds Follow up  In 3 months   [place], FNP

## 2014-10-29 ENCOUNTER — Other Ambulatory Visit: Payer: Self-pay | Admitting: *Deleted

## 2014-10-29 LAB — CMP14+EGFR
ALK PHOS: 57 IU/L (ref 39–117)
ALT: 13 IU/L (ref 0–44)
AST: 15 IU/L (ref 0–40)
Albumin/Globulin Ratio: 1.6 (ref 1.1–2.5)
Albumin: 4 g/dL (ref 3.5–4.7)
BUN / CREAT RATIO: 12 (ref 10–22)
BUN: 17 mg/dL (ref 8–27)
CO2: 25 mmol/L (ref 18–29)
Calcium: 9 mg/dL (ref 8.6–10.2)
Chloride: 97 mmol/L (ref 97–108)
Creatinine, Ser: 1.47 mg/dL — ABNORMAL HIGH (ref 0.76–1.27)
GFR calc Af Amer: 51 mL/min/{1.73_m2} — ABNORMAL LOW (ref >59)
GFR calc non Af Amer: 44 mL/min/{1.73_m2} — ABNORMAL LOW (ref >59)
Globulin, Total: 2.5 g/dL (ref 1.5–4.5)
Glucose: 191 mg/dL — ABNORMAL HIGH (ref 65–99)
Potassium: 4.2 mmol/L (ref 3.5–5.2)
SODIUM: 139 mmol/L (ref 134–144)
Total Bilirubin: 0.4 mg/dL (ref 0.0–1.2)
Total Protein: 6.5 g/dL (ref 6.0–8.5)

## 2014-10-29 LAB — PSA, TOTAL AND FREE
PSA FREE PCT: 12.9 %
PSA, Free: 0.71 ng/mL
PSA: 5.5 ng/mL — ABNORMAL HIGH (ref 0.0–4.0)

## 2014-10-29 LAB — NMR, LIPOPROFILE
CHOLESTEROL: 167 mg/dL (ref 100–199)
HDL Cholesterol by NMR: 60 mg/dL (ref >39)
HDL Particle Number: 34.4 umol/L (ref >=30.5)
LDL Particle Number: 1047 nmol/L — ABNORMAL HIGH (ref <1000)
LDL SIZE: 21.4 nm (ref >20.5)
LDL-C: 67 mg/dL (ref 0–99)
LP-IR Score: 51 — ABNORMAL HIGH (ref <=45)
SMALL LDL PARTICLE NUMBER: 274 nmol/L (ref <=527)
TRIGLYCERIDES BY NMR: 200 mg/dL — AB (ref 0–149)

## 2014-10-29 MED ORDER — OMEPRAZOLE 20 MG PO CPDR: 20.0000 mg | DELAYED_RELEASE_CAPSULE | Freq: Every day | ORAL | Status: DC

## 2014-11-01 ENCOUNTER — Telehealth: Payer: Self-pay | Admitting: Family Medicine

## 2014-11-01 NOTE — Telephone Encounter (Signed)
Patient aware of results.

## 2014-11-01 NOTE — Telephone Encounter (Signed)
-----   Message from [name], [place] sent at 10/30/2014  4:01 PM EST ----- Hgba1c discussed at appointment Kidney and liver function stable Cholesterol looks great PSA elevated- patient needs to see urologist ASAP= has he ever seen one?

## 2014-11-07 ENCOUNTER — Telehealth: Payer: Self-pay | Admitting: Nurse Practitioner

## 2014-11-07 MED ORDER — FENTANYL 50 MCG/HR TD PT72: 50.0000 ug | MEDICATED_PATCH | TRANSDERMAL | Status: DC

## 2014-11-07 NOTE — Telephone Encounter (Signed)
rx ready for pickup 

## 2014-11-16 ENCOUNTER — Other Ambulatory Visit: Payer: Self-pay | Admitting: Nurse Practitioner

## 2014-12-02 ENCOUNTER — Other Ambulatory Visit: Payer: Self-pay | Admitting: Family Medicine

## 2014-12-05 ENCOUNTER — Telehealth: Payer: Self-pay | Admitting: *Deleted

## 2014-12-05 MED ORDER — FENTANYL 50 MCG/HR TD PT72: 50.0000 ug | MEDICATED_PATCH | TRANSDERMAL | Status: DC

## 2014-12-05 NOTE — Telephone Encounter (Signed)
Aware ,script ready. 

## 2014-12-05 NOTE — Telephone Encounter (Signed)
Please review and advise.

## 2014-12-05 NOTE — Telephone Encounter (Signed)
This is okay 1 

## 2014-12-05 NOTE — Telephone Encounter (Signed)
MMM pt, last seen 10/28/14, last filled 11/07/14. Rx will print, get MOORE to sign

## 2014-12-27 LAB — HM DIABETES EYE EXAM

## 2015-01-02 ENCOUNTER — Telehealth: Payer: Self-pay | Admitting: Nurse Practitioner

## 2015-01-02 ENCOUNTER — Encounter: Payer: Self-pay | Admitting: *Deleted

## 2015-01-03 MED ORDER — GLUCOSE BLOOD VI STRP: ORAL_STRIP | Status: DC

## 2015-01-03 MED ORDER — FENTANYL 50 MCG/HR TD PT72: 50.0000 ug | MEDICATED_PATCH | TRANSDERMAL | Status: DC

## 2015-01-03 NOTE — Telephone Encounter (Signed)
Fentanyl rx ready for pick up

## 2015-01-03 NOTE — Telephone Encounter (Signed)
Aware, script for pain patch ready.

## 2015-01-05 ENCOUNTER — Other Ambulatory Visit: Payer: Self-pay | Admitting: Nurse Practitioner

## 2015-01-17 ENCOUNTER — Other Ambulatory Visit: Payer: Self-pay | Admitting: Family

## 2015-01-30 ENCOUNTER — Encounter: Payer: Self-pay | Admitting: Nurse Practitioner

## 2015-01-30 ENCOUNTER — Ambulatory Visit (INDEPENDENT_AMBULATORY_CARE_PROVIDER_SITE_OTHER): Payer: Medicare HMO | Admitting: Nurse Practitioner

## 2015-01-30 VITALS — BP 138/84 | HR 109 | Temp 96.8°F | Ht 68.0 in | Wt 239.0 lb

## 2015-01-30 DIAGNOSIS — G8929 Other chronic pain: Secondary | ICD-10-CM

## 2015-01-30 DIAGNOSIS — E785 Hyperlipidemia, unspecified: Secondary | ICD-10-CM

## 2015-01-30 DIAGNOSIS — Z6833 Body mass index (BMI) 33.0-33.9, adult: Secondary | ICD-10-CM

## 2015-01-30 DIAGNOSIS — I82409 Acute embolism and thrombosis of unspecified deep veins of unspecified lower extremity: Secondary | ICD-10-CM

## 2015-01-30 DIAGNOSIS — N400 Enlarged prostate without lower urinary tract symptoms: Secondary | ICD-10-CM

## 2015-01-30 DIAGNOSIS — E1142 Type 2 diabetes mellitus with diabetic polyneuropathy: Secondary | ICD-10-CM

## 2015-01-30 DIAGNOSIS — M549 Dorsalgia, unspecified: Secondary | ICD-10-CM

## 2015-01-30 DIAGNOSIS — I10 Essential (primary) hypertension: Secondary | ICD-10-CM

## 2015-01-30 DIAGNOSIS — K219 Gastro-esophageal reflux disease without esophagitis: Secondary | ICD-10-CM

## 2015-01-30 DIAGNOSIS — E119 Type 2 diabetes mellitus without complications: Secondary | ICD-10-CM

## 2015-01-30 LAB — POCT UA - MICROALBUMIN: Microalbumin Ur, POC: 50 mg/L

## 2015-01-30 LAB — POCT GLYCOSYLATED HEMOGLOBIN (HGB A1C): Hemoglobin A1C: 7.6

## 2015-01-30 MED ORDER — FUROSEMIDE 40 MG PO TABS: ORAL_TABLET | ORAL | Status: DC

## 2015-01-30 MED ORDER — OMEPRAZOLE 20 MG PO CPDR: 20.0000 mg | DELAYED_RELEASE_CAPSULE | Freq: Every day | ORAL | Status: DC

## 2015-01-30 MED ORDER — FENTANYL 50 MCG/HR TD PT72: 50.0000 ug | MEDICATED_PATCH | TRANSDERMAL | Status: DC

## 2015-01-30 MED ORDER — FINASTERIDE 5 MG PO TABS: ORAL_TABLET | ORAL | Status: DC

## 2015-01-30 MED ORDER — LOSARTAN POTASSIUM 50 MG PO TABS: 50.0000 mg | ORAL_TABLET | Freq: Every day | ORAL | Status: DC

## 2015-01-30 MED ORDER — METFORMIN HCL ER 500 MG PO TB24: 500.0000 mg | ORAL_TABLET | Freq: Every day | ORAL | Status: DC

## 2015-01-30 MED ORDER — AMLODIPINE BESYLATE 5 MG PO TABS: ORAL_TABLET | ORAL | Status: DC

## 2015-01-30 NOTE — Progress Notes (Signed)
Subjective:    Patient ID: [name], male    DOB: 09/17/1930, 81 y.o.   MRN: [id number]  Patient here today for follow up of chronic medical problems.   Hypertension This is a chronic problem. The current episode started more than 1 year ago. The problem is controlled. Associated symptoms include chest pain. Pertinent negatives include no headaches, palpitations or shortness of breath. Risk factors for coronary artery disease include diabetes mellitus, dyslipidemia, male gender and obesity. Past treatments include calcium channel blockers, diuretics and angiotensin blockers. There are no compliance problems.  Hypertensive end-organ damage includes CAD/MI.  Hyperlipidemia This is a chronic problem. The current episode started more than 1 year ago. The problem is controlled. Recent lipid tests were reviewed and are high. Factors aggravating his hyperlipidemia include thiazides. Associated symptoms include chest pain. Pertinent negatives include no shortness of breath. The current treatment provides moderate improvement of lipids. Compliance problems include adherence to diet and adherence to exercise.  Risk factors for coronary artery disease include diabetes mellitus, dyslipidemia, hypertension and obesity.  Diabetes He presents for his follow-up diabetic visit. He has type 2 diabetes mellitus. No MedicAlert identification noted. His disease course has been stable. Pertinent negatives for hypoglycemia include no headaches. Associated symptoms include chest pain. Pertinent negatives for diabetes include no polydipsia, no polyphagia, no polyuria and no visual change. There are no hypoglycemic complications. Symptoms are stable. There are no diabetic complications. Risk factors for coronary artery disease include diabetes mellitus, dyslipidemia and hypertension. Current diabetic treatment includes oral agent (monotherapy). His weight is stable. When asked about meal planning, he reported none. He  has not had a previous visit with a dietitian. His breakfast blood glucose is taken between 8-9 am. His breakfast blood glucose range is generally 140-180 mg/dl. His overall blood glucose range is 140-180 mg/dl. An ACE inhibitor/angiotensin II receptor blocker is being taken. He does not see a podiatrist.Eye exam is not current.  BPH currrently on hytrin and proscar- no trouble passing his water- sees urologist. depression celexa working well to keep him from worrying all the time. GERD/Hialtal hernia Omeprazole working well to keep symptoms under control B12 anemia injections monthly- no c/o fatigue Chronic back pain and neck pain Fentanyl patch and percocet- patient has been on for awhile - is not a candidate for surgery.     Review of Systems  Constitutional: Negative.   HENT: Negative.   Respiratory: Negative for shortness of breath.   Cardiovascular: Positive for chest pain. Negative for palpitations.  Endocrine: Negative for polydipsia, polyphagia and polyuria.  Skin: Negative.   Neurological: Negative for headaches.  Psychiatric/Behavioral: Negative.   All other systems reviewed and are negative.      Objective:   Physical Exam  Constitutional: He is oriented to person, place, and time. He appears well-developed and well-nourished.  HENT:  Head: Normocephalic.  Right Ear: External ear normal.  Left Ear: External ear normal.  Nose: Nose normal.  Mouth/Throat: Oropharynx is clear and moist.  Eyes: EOM are normal. Pupils are equal, round, and reactive to light.  Neck: Normal range of motion. Neck supple. No JVD present. No thyromegaly present.  Cardiovascular: Normal rate, regular rhythm, normal heart sounds and intact distal pulses.  Exam reveals no gallop and no friction rub.   No murmur heard. Pulmonary/Chest: Effort normal and breath sounds normal. No respiratory distress. He has no wheezes. He has no rales. He exhibits no tenderness.  Shallow breathing  Abdominal:  Soft. Bowel sounds are  normal. He exhibits no mass. There is no tenderness.  Genitourinary:  Prostate exam deferred to urologist  Musculoskeletal: Normal range of motion. He exhibits no edema.  Lymphadenopathy:    He has no cervical adenopathy.  Neurological: He is alert and oriented to person, place, and time. No cranial nerve deficit.  Skin: Skin is warm and dry.  Psychiatric: He has a normal mood and affect. His behavior is normal. Judgment and thought content normal.    BP 138/84 mmHg  Pulse 109  Temp(Src) 96.8 F (36 C) (Oral)  Ht '5\' 8"'  (1.727 m)  Wt 239 lb (108.41 kg)  BMI 36.35 kg/m2    Results for orders placed or performed in visit on 01/30/15  POCT glycosylated hemoglobin (Hb A1C)  Result Value Ref Range   Hemoglobin A1C 7.6   POCT UA - Microalbumin  Result Value Ref Range   Microalbumin Ur, POC 50 mg/L          Assessment & Plan:   1. Type 2 diabetes mellitus without complication Patient has been eating ice cream every night- needto switch to sugar free Stop ensure and do glucerna - POCT glycosylated hemoglobin (Hb A1C) - POCT UA - Microalbumin - Microalbumin, urine - metFORMIN (GLUCOPHAGE-XR) 500 MG 24 hr tablet; Take 1 tablet (500 mg total) by mouth daily with breakfast.  Dispense: 180 tablet; Refill: 1  2. Hyperlipidemia Low fat diet - NMR, lipoprofile  3. Essential hypertension Do not add slat to diet - CMP14+EGFR - amLODipine (NORVASC) 5 MG tablet; Take 1 tablet (5 mg total) by mouth daily.  Dispense: 90 tablet; Refill: 1 - furosemide (LASIX) 40 MG tablet; TAKE 1 TABLET DAILY  Dispense: 90 tablet; Refill: 1 - losartan (COZAAR) 50 MG tablet; Take 1 tablet (50 mg total) by mouth daily.  Dispense: 90 tablet; Refill: 1  4. DVT (deep venous thrombosis), unspecified laterality  5. Diabetic polyneuropathy associated with type 2 diabetes mellitus  6. Chronic back pain Continue fentanyl patch  7. BMI 33.0-33.9,adult Discussed diet and exercise for  person with BMI >25 Will recheck weight in 3-6 months   8. BPH (benign prostatic hyperplasia) - finasteride (PROSCAR) 5 MG tablet; Take 1 tablet (5 mg total) by mouth daily.  Dispense: 90 tablet; Refill: 1  9. Gastroesophageal reflux disease without esophagitis - omeprazole (PRILOSEC) 20 MG capsule; Take 1 capsule (20 mg total) by mouth daily.  Dispense: 90 capsule; Refill: 1    Labs pending Health maintenance reviewed Diet and exercise encouraged Continue all meds Follow up  In 3 month    [place], FNP

## 2015-01-30 NOTE — Patient Instructions (Signed)

## 2015-01-31 LAB — CMP14+EGFR
A/G RATIO: 1.5 (ref 1.1–2.5)
ALT: 20 IU/L (ref 0–44)
AST: 19 IU/L (ref 0–40)
Albumin: 4 g/dL (ref 3.5–4.7)
Alkaline Phosphatase: 63 IU/L (ref 39–117)
BUN/Creatinine Ratio: 12 (ref 10–22)
BUN: 18 mg/dL (ref 8–27)
CALCIUM: 9 mg/dL (ref 8.6–10.2)
CHLORIDE: 96 mmol/L — AB (ref 97–108)
CO2: 29 mmol/L (ref 18–29)
Creatinine, Ser: 1.47 mg/dL — ABNORMAL HIGH (ref 0.76–1.27)
GFR calc Af Amer: 51 mL/min/{1.73_m2} — ABNORMAL LOW (ref >59)
GFR calc non Af Amer: 44 mL/min/{1.73_m2} — ABNORMAL LOW (ref >59)
GLOBULIN, TOTAL: 2.6 g/dL (ref 1.5–4.5)
GLUCOSE: 261 mg/dL — AB (ref 65–99)
POTASSIUM: 4.8 mmol/L (ref 3.5–5.2)
Sodium: 139 mmol/L (ref 134–144)
Total Bilirubin: 0.4 mg/dL (ref 0.0–1.2)
Total Protein: 6.6 g/dL (ref 6.0–8.5)

## 2015-01-31 LAB — MICROALBUMIN, URINE: Microalbumin, Urine: 40.8 ug/mL — ABNORMAL HIGH (ref 0.0–17.0)

## 2015-01-31 LAB — NMR, LIPOPROFILE
Cholesterol: 154 mg/dL (ref 100–199)
HDL Cholesterol by NMR: 53 mg/dL (ref >39)
HDL PARTICLE NUMBER: 32.5 umol/L (ref >=30.5)
LDL Particle Number: 985 nmol/L (ref <1000)
LDL Size: 21.2 nm (ref >20.5)
LDL-C: 64 mg/dL (ref 0–99)
LP-IR Score: 60 — ABNORMAL HIGH (ref <=45)
SMALL LDL PARTICLE NUMBER: 436 nmol/L (ref <=527)
Triglycerides by NMR: 186 mg/dL — ABNORMAL HIGH (ref 0–149)

## 2015-03-01 ENCOUNTER — Other Ambulatory Visit: Payer: Self-pay | Admitting: Nurse Practitioner

## 2015-03-01 MED ORDER — FENTANYL 50 MCG/HR TD PT72: 50.0000 ug | MEDICATED_PATCH | TRANSDERMAL | Status: DC

## 2015-03-01 NOTE — Telephone Encounter (Signed)
Fentanyl rx ready for pick up

## 2015-03-02 NOTE — Telephone Encounter (Signed)
Pt notified RX is ready for pick up RX to the front

## 2015-04-06 ENCOUNTER — Telehealth: Payer: Self-pay | Admitting: Nurse Practitioner

## 2015-04-06 ENCOUNTER — Other Ambulatory Visit: Payer: Self-pay | Admitting: Nurse Practitioner

## 2015-04-06 MED ORDER — FENTANYL 50 MCG/HR TD PT72: 50.0000 ug | MEDICATED_PATCH | TRANSDERMAL | Status: DC

## 2015-04-06 NOTE — Telephone Encounter (Signed)
rx ready for pickup 

## 2015-04-07 NOTE — Telephone Encounter (Signed)
Patient aware rx ready to be picked up 

## 2015-04-19 ENCOUNTER — Other Ambulatory Visit: Payer: Self-pay | Admitting: Nurse Practitioner

## 2015-05-01 ENCOUNTER — Encounter: Payer: Self-pay | Admitting: Nurse Practitioner

## 2015-05-01 ENCOUNTER — Ambulatory Visit (INDEPENDENT_AMBULATORY_CARE_PROVIDER_SITE_OTHER): Payer: Medicare HMO | Admitting: Nurse Practitioner

## 2015-05-01 VITALS — BP 108/60 | HR 91 | Temp 98.1°F | Ht 68.0 in | Wt 231.0 lb

## 2015-05-01 DIAGNOSIS — E119 Type 2 diabetes mellitus without complications: Secondary | ICD-10-CM | POA: Diagnosis not present

## 2015-05-01 DIAGNOSIS — I10 Essential (primary) hypertension: Secondary | ICD-10-CM | POA: Diagnosis not present

## 2015-05-01 DIAGNOSIS — F4323 Adjustment disorder with mixed anxiety and depressed mood: Secondary | ICD-10-CM | POA: Diagnosis not present

## 2015-05-01 DIAGNOSIS — M549 Dorsalgia, unspecified: Secondary | ICD-10-CM

## 2015-05-01 DIAGNOSIS — G8929 Other chronic pain: Secondary | ICD-10-CM | POA: Diagnosis not present

## 2015-05-01 DIAGNOSIS — I82409 Acute embolism and thrombosis of unspecified deep veins of unspecified lower extremity: Secondary | ICD-10-CM

## 2015-05-01 DIAGNOSIS — I25810 Atherosclerosis of coronary artery bypass graft(s) without angina pectoris: Secondary | ICD-10-CM | POA: Diagnosis not present

## 2015-05-01 DIAGNOSIS — F419 Anxiety disorder, unspecified: Secondary | ICD-10-CM

## 2015-05-01 DIAGNOSIS — E1142 Type 2 diabetes mellitus with diabetic polyneuropathy: Secondary | ICD-10-CM | POA: Diagnosis not present

## 2015-05-01 DIAGNOSIS — Z23 Encounter for immunization: Secondary | ICD-10-CM

## 2015-05-01 DIAGNOSIS — C61 Malignant neoplasm of prostate: Secondary | ICD-10-CM

## 2015-05-01 DIAGNOSIS — Z6833 Body mass index (BMI) 33.0-33.9, adult: Secondary | ICD-10-CM

## 2015-05-01 DIAGNOSIS — E785 Hyperlipidemia, unspecified: Secondary | ICD-10-CM | POA: Diagnosis not present

## 2015-05-01 LAB — POCT GLYCOSYLATED HEMOGLOBIN (HGB A1C): Hemoglobin A1C: 6.8

## 2015-05-01 MED ORDER — FENTANYL 50 MCG/HR TD PT72: 50.0000 ug | MEDICATED_PATCH | TRANSDERMAL | Status: DC

## 2015-05-01 MED ORDER — CITALOPRAM HYDROBROMIDE 20 MG PO TABS: 20.0000 mg | ORAL_TABLET | Freq: Every day | ORAL | Status: DC

## 2015-05-01 MED ORDER — ROSUVASTATIN CALCIUM 5 MG PO TABS: 5.0000 mg | ORAL_TABLET | Freq: Every day | ORAL | Status: DC

## 2015-05-01 NOTE — Patient Instructions (Signed)

## 2015-05-01 NOTE — Addendum Note (Signed)
Addended by: Rolena Infante on: 05/01/2015 03:32 PM   Modules accepted: Orders

## 2015-05-01 NOTE — Progress Notes (Signed)
Subjective:    Patient ID: [name], male    DOB: 1930-12-28, 81 y.o.   MRN: [id number]  Patient here today for follow up of chronic medical problems.   *patient  C/o being swimmy headed intermittent for several years and started again today.  Hypertension This is a chronic problem. The current episode started more than 1 year ago. The problem is controlled. Associated symptoms include chest pain and shortness of breath (occasional). Pertinent negatives include no headaches or palpitations. Risk factors for coronary artery disease include diabetes mellitus, dyslipidemia, male gender and obesity. Past treatments include calcium channel blockers, diuretics and angiotensin blockers. There are no compliance problems.  Hypertensive end-organ damage includes CAD/MI.  Hyperlipidemia This is a chronic problem. The current episode started more than 1 year ago. The problem is controlled. Recent lipid tests were reviewed and are high. Factors aggravating his hyperlipidemia include thiazides. Associated symptoms include chest pain and shortness of breath (occasional). The current treatment provides moderate improvement of lipids. Compliance problems include adherence to diet and adherence to exercise.  Risk factors for coronary artery disease include diabetes mellitus, dyslipidemia, hypertension and obesity.  Diabetes He presents for his follow-up diabetic visit. He has type 2 diabetes mellitus. No MedicAlert identification noted. His disease course has been stable. Pertinent negatives for hypoglycemia include no headaches. Associated symptoms include chest pain. Pertinent negatives for diabetes include no polydipsia, no polyphagia, no polyuria and no visual change. There are no hypoglycemic complications. Symptoms are stable. There are no diabetic complications. Risk factors for coronary artery disease include diabetes mellitus, dyslipidemia and hypertension. Current diabetic treatment includes oral  agent (monotherapy). His weight is stable. When asked about meal planning, he reported none. He has not had a previous visit with a dietitian. His breakfast blood glucose is taken between 8-9 am. His breakfast blood glucose range is generally 140-180 mg/dl. His overall blood glucose range is 140-180 mg/dl. An ACE inhibitor/angiotensin II receptor blocker is being taken. He does not see a podiatrist.Eye exam is not current.  BPH currrently on hytrin and proscar- no trouble passing his water- sees urologist. depression celexa working well to keep him from worrying all the time. GERD/Hialtal hernia Omeprazole working well to keep symptoms under control B12 anemia injections monthly- no c/o fatigue Chronic back pain and neck pain Fentanyl patch and percocet- patient has been on for awhile - is not a candidate for surgery.     Review of Systems  Constitutional: Negative.   HENT: Negative.   Respiratory: Positive for shortness of breath (occasional).   Cardiovascular: Positive for chest pain. Negative for palpitations.  Endocrine: Negative for polydipsia, polyphagia and polyuria.  Genitourinary: Positive for frequency and difficulty urinating.  Skin: Negative.   Neurological: Negative for headaches.  Psychiatric/Behavioral: Negative.   All other systems reviewed and are negative.      Objective:   Physical Exam  Constitutional: He is oriented to person, place, and time. He appears well-developed and well-nourished.  HENT:  Head: Normocephalic.  Right Ear: External ear normal.  Left Ear: External ear normal.  Nose: Nose normal.  Mouth/Throat: Oropharynx is clear and moist.  Eyes: EOM are normal. Pupils are equal, round, and reactive to light.  Neck: Normal range of motion. Neck supple. No JVD present. No thyromegaly present.  Cardiovascular: Normal rate, regular rhythm, normal heart sounds and intact distal pulses.  Exam reveals no gallop and no friction rub.   No murmur  heard. Pulmonary/Chest: Effort normal and breath sounds normal.  No respiratory distress. He has no wheezes. He has no rales. He exhibits no tenderness.  Shallow breathing  Abdominal: Soft. Bowel sounds are normal. He exhibits no mass. There is no tenderness.  Genitourinary:  Prostate exam deferred to urologist  Musculoskeletal: Normal range of motion. He exhibits no edema.  Lymphadenopathy:    He has no cervical adenopathy.  Neurological: He is alert and oriented to person, place, and time. No cranial nerve deficit.  Skin: Skin is warm and dry.  Psychiatric: He has a normal mood and affect. His behavior is normal. Judgment and thought content normal.    BP 108/60 mmHg  Pulse 91  Temp(Src) 98.1 F (36.7 C) (Oral)  Ht 5' 8" (1.727 m)  Wt 231 lb (104.781 kg)  BMI 35.13 kg/m2    Results for orders placed or performed in visit on 05/01/15  POCT glycosylated hemoglobin (Hb A1C)  Result Value Ref Range   Hemoglobin A1C 6.8            Assessment & Plan:   1. Type 2 diabetes mellitus without complication Continue to watch carbs in diet - POCT glycosylated hemoglobin (Hb A1C)  2. Hyperlipidemia Low fat diet - NMR, lipoprofile - rosuvastatin (CRESTOR) 5 MG tablet; Take 1 tablet (5 mg total) by mouth daily.  Dispense: 30 tablet; Refill: 5  3. Essential hypertension Do not add salt to diet - CMP14+EGFR  4. Prostate cancer Keep follow up with urologist - PSA, total and free  5. DVT (deep venous thrombosis), unspecified laterality  6. Coronary artery disease involving nonautologous biological coronary bypass graft without angina pectoris Keep follow up appointment with cardiologist  7. Diabetic polyneuropathy associated with type 2 diabetes mellitus  8. Chronic back pain - fentaNYL (DURAGESIC) 50 MCG/HR; Place 1 patch (50 mcg total) onto the skin every 3 (three) days.  Dispense: 10 patch; Refill: 0  9. BMI 33.0-33.9,adult Discussed diet and exercise for person with  BMI >25 Will recheck weight in 3-6 months  10. Anxiety **stress management - citalopram (CELEXA) 20 MG tablet; Take 1 tablet (20 mg total) by mouth daily.  Dispense: 30 tablet; Refill: 5   prevnar 13 today Labs pending Health maintenance reviewed Diet and exercise encouraged Continue all meds Follow up  In 3 month   [place], FNP

## 2015-05-02 LAB — CMP14+EGFR
ALT: 15 IU/L (ref 0–44)
AST: 18 IU/L (ref 0–40)
Albumin/Globulin Ratio: 1.8 (ref 1.1–2.5)
Albumin: 4.4 g/dL (ref 3.5–4.7)
Alkaline Phosphatase: 73 IU/L (ref 39–117)
BUN/Creatinine Ratio: 10 (ref 10–22)
BUN: 16 mg/dL (ref 8–27)
Bilirubin Total: 0.4 mg/dL (ref 0.0–1.2)
CO2: 25 mmol/L (ref 18–29)
Calcium: 9.6 mg/dL (ref 8.6–10.2)
Chloride: 99 mmol/L (ref 97–108)
Creatinine, Ser: 1.53 mg/dL — ABNORMAL HIGH (ref 0.76–1.27)
GFR calc Af Amer: 48 mL/min/{1.73_m2} — ABNORMAL LOW (ref >59)
GFR calc non Af Amer: 42 mL/min/{1.73_m2} — ABNORMAL LOW (ref >59)
Globulin, Total: 2.5 g/dL (ref 1.5–4.5)
Glucose: 119 mg/dL — ABNORMAL HIGH (ref 65–99)
Potassium: 5.4 mmol/L — ABNORMAL HIGH (ref 3.5–5.2)
Sodium: 141 mmol/L (ref 134–144)
Total Protein: 6.9 g/dL (ref 6.0–8.5)

## 2015-05-02 LAB — NMR, LIPOPROFILE
Cholesterol: 169 mg/dL (ref 100–199)
HDL Cholesterol by NMR: 50 mg/dL (ref >39)
HDL Particle Number: 29.2 umol/L — ABNORMAL LOW (ref >=30.5)
LDL PARTICLE NUMBER: 1295 nmol/L — AB (ref <1000)
LDL SIZE: 21 nm (ref >20.5)
LDL-C: 84 mg/dL (ref 0–99)
SMALL LDL PARTICLE NUMBER: 569 nmol/L — AB (ref <=527)
Triglycerides by NMR: 173 mg/dL — ABNORMAL HIGH (ref 0–149)

## 2015-05-02 LAB — PSA, TOTAL AND FREE
PSA FREE PCT: 11.9 %
PSA, Free: 0.62 ng/mL
Prostate Specific Ag, Serum: 5.2 ng/mL — ABNORMAL HIGH (ref 0.0–4.0)

## 2015-05-03 ENCOUNTER — Telehealth: Payer: Self-pay | Admitting: *Deleted

## 2015-05-03 ENCOUNTER — Telehealth: Payer: Self-pay | Admitting: Nurse Practitioner

## 2015-05-03 NOTE — Telephone Encounter (Signed)
Patient aware of results.

## 2015-05-03 NOTE — Telephone Encounter (Signed)
-----   Message from [facility] [place] sent at 05/02/2015  1:40 PM EDT ----- Hgba1c discussed at appointment Kidney and liver function stable- K slightly elevated- decrease K+ in diet Cholesterol looks good PSA elevated- need to forward to urologist ASAP Continue current meds- low fat diet and exercise and recheck in 3 months

## 2015-05-08 ENCOUNTER — Telehealth: Payer: Self-pay | Admitting: Nurse Practitioner

## 2015-05-16 ENCOUNTER — Other Ambulatory Visit: Payer: Self-pay | Admitting: Nurse Practitioner

## 2015-05-19 ENCOUNTER — Telehealth: Payer: Self-pay | Admitting: Nurse Practitioner

## 2015-05-19 ENCOUNTER — Other Ambulatory Visit: Payer: Self-pay | Admitting: *Deleted

## 2015-05-19 MED ORDER — HUGO ROLLING WALKER ELITE MISC: 1.0000 | Freq: Every day | Status: DC

## 2015-05-19 NOTE — Telephone Encounter (Signed)
rx for walker ready for pick up

## 2015-06-06 ENCOUNTER — Other Ambulatory Visit: Payer: Self-pay | Admitting: Nurse Practitioner

## 2015-06-06 DIAGNOSIS — G8929 Other chronic pain: Secondary | ICD-10-CM

## 2015-06-06 DIAGNOSIS — M549 Dorsalgia, unspecified: Principal | ICD-10-CM

## 2015-06-06 MED ORDER — FENTANYL 50 MCG/HR TD PT72: 50.0000 ug | MEDICATED_PATCH | TRANSDERMAL | Status: DC

## 2015-06-06 NOTE — Telephone Encounter (Signed)
Rx ready for pick up. 

## 2015-06-06 NOTE — Telephone Encounter (Signed)
Last filled 5/6

## 2015-06-08 NOTE — Telephone Encounter (Signed)
Pt notified RX for Fentanyl is ready for pick up Verbalizes understanding

## 2015-07-11 ENCOUNTER — Telehealth: Payer: Self-pay | Admitting: Nurse Practitioner

## 2015-07-11 DIAGNOSIS — G8929 Other chronic pain: Secondary | ICD-10-CM

## 2015-07-11 DIAGNOSIS — M549 Dorsalgia, unspecified: Principal | ICD-10-CM

## 2015-07-12 MED ORDER — FENTANYL 50 MCG/HR TD PT72: 50.0000 ug | MEDICATED_PATCH | TRANSDERMAL | Status: DC

## 2015-07-12 NOTE — Telephone Encounter (Signed)
Fentanyl rx ready for pick up

## 2015-07-12 NOTE — Telephone Encounter (Signed)
Pt aware of written Rx is at front desk ready for pickup

## 2015-08-01 ENCOUNTER — Other Ambulatory Visit: Payer: Self-pay | Admitting: Nurse Practitioner

## 2015-08-04 ENCOUNTER — Encounter: Payer: Self-pay | Admitting: Nurse Practitioner

## 2015-08-04 ENCOUNTER — Ambulatory Visit (INDEPENDENT_AMBULATORY_CARE_PROVIDER_SITE_OTHER): Payer: Commercial Managed Care - HMO | Admitting: Nurse Practitioner

## 2015-08-04 VITALS — BP 129/81 | HR 94 | Temp 96.1°F | Ht 68.0 in | Wt 234.0 lb

## 2015-08-04 DIAGNOSIS — I10 Essential (primary) hypertension: Secondary | ICD-10-CM | POA: Diagnosis not present

## 2015-08-04 DIAGNOSIS — E1142 Type 2 diabetes mellitus with diabetic polyneuropathy: Secondary | ICD-10-CM | POA: Diagnosis not present

## 2015-08-04 DIAGNOSIS — Z6833 Body mass index (BMI) 33.0-33.9, adult: Secondary | ICD-10-CM | POA: Diagnosis not present

## 2015-08-04 DIAGNOSIS — K219 Gastro-esophageal reflux disease without esophagitis: Secondary | ICD-10-CM | POA: Diagnosis not present

## 2015-08-04 DIAGNOSIS — N400 Enlarged prostate without lower urinary tract symptoms: Secondary | ICD-10-CM

## 2015-08-04 DIAGNOSIS — M549 Dorsalgia, unspecified: Secondary | ICD-10-CM | POA: Diagnosis not present

## 2015-08-04 DIAGNOSIS — F419 Anxiety disorder, unspecified: Secondary | ICD-10-CM

## 2015-08-04 DIAGNOSIS — E11359 Type 2 diabetes mellitus with proliferative diabetic retinopathy without macular edema: Secondary | ICD-10-CM

## 2015-08-04 DIAGNOSIS — N289 Disorder of kidney and ureter, unspecified: Secondary | ICD-10-CM

## 2015-08-04 DIAGNOSIS — I25810 Atherosclerosis of coronary artery bypass graft(s) without angina pectoris: Secondary | ICD-10-CM

## 2015-08-04 DIAGNOSIS — R609 Edema, unspecified: Secondary | ICD-10-CM

## 2015-08-04 DIAGNOSIS — E113599 Type 2 diabetes mellitus with proliferative diabetic retinopathy without macular edema, unspecified eye: Secondary | ICD-10-CM

## 2015-08-04 DIAGNOSIS — E785 Hyperlipidemia, unspecified: Secondary | ICD-10-CM

## 2015-08-04 DIAGNOSIS — R079 Chest pain, unspecified: Secondary | ICD-10-CM | POA: Diagnosis not present

## 2015-08-04 DIAGNOSIS — G8929 Other chronic pain: Secondary | ICD-10-CM

## 2015-08-04 DIAGNOSIS — E119 Type 2 diabetes mellitus without complications: Secondary | ICD-10-CM

## 2015-08-04 LAB — POCT GLYCOSYLATED HEMOGLOBIN (HGB A1C): Hemoglobin A1C: 7.3

## 2015-08-04 MED ORDER — AMLODIPINE BESYLATE 5 MG PO TABS: ORAL_TABLET | ORAL | Status: DC

## 2015-08-04 MED ORDER — FINASTERIDE 5 MG PO TABS: ORAL_TABLET | ORAL | Status: DC

## 2015-08-04 MED ORDER — FUROSEMIDE 40 MG PO TABS: 40.0000 mg | ORAL_TABLET | Freq: Every day | ORAL | Status: DC

## 2015-08-04 MED ORDER — OMEPRAZOLE 20 MG PO CPDR: 20.0000 mg | DELAYED_RELEASE_CAPSULE | Freq: Every day | ORAL | Status: DC

## 2015-08-04 MED ORDER — FENTANYL 50 MCG/HR TD PT72: 50.0000 ug | MEDICATED_PATCH | TRANSDERMAL | Status: DC

## 2015-08-04 MED ORDER — HUGO ROLLING WALKER ELITE MISC: 1.0000 | Freq: Every day | Status: DC

## 2015-08-04 MED ORDER — ROSUVASTATIN CALCIUM 5 MG PO TABS: 5.0000 mg | ORAL_TABLET | Freq: Every day | ORAL | Status: DC

## 2015-08-04 MED ORDER — METFORMIN HCL ER 500 MG PO TB24: 500.0000 mg | ORAL_TABLET | Freq: Every day | ORAL | Status: DC

## 2015-08-04 MED ORDER — TERAZOSIN HCL 2 MG PO CAPS: 2.0000 mg | ORAL_CAPSULE | Freq: Every day | ORAL | Status: DC

## 2015-08-04 MED ORDER — CITALOPRAM HYDROBROMIDE 20 MG PO TABS: 20.0000 mg | ORAL_TABLET | Freq: Every day | ORAL | Status: DC

## 2015-08-04 MED ORDER — LOSARTAN POTASSIUM 50 MG PO TABS: 50.0000 mg | ORAL_TABLET | Freq: Every day | ORAL | Status: DC

## 2015-08-04 NOTE — Progress Notes (Signed)
Subjective:    Patient ID: [name], male    DOB: Nov 22, 1930, 81 y.o.   MRN: [id number]  Patient here today for follow up of chronic medical problems.   *patient  Was cleaning sliding glass door window yesterday and he started having chest pain. He stopped and took osome baby asa and pain eased off.  Hypertension This is a chronic problem. The current episode started more than 1 year ago. The problem is controlled. Associated symptoms include chest pain and shortness of breath (occasional). Pertinent negatives include no headaches or palpitations. Risk factors for coronary artery disease include diabetes mellitus, dyslipidemia, male gender and obesity. Past treatments include calcium channel blockers, diuretics and angiotensin blockers. There are no compliance problems.  Hypertensive end-organ damage includes CAD/MI.  Hyperlipidemia This is a chronic problem. The current episode started more than 1 year ago. The problem is controlled. Recent lipid tests were reviewed and are high. Factors aggravating his hyperlipidemia include thiazides. Associated symptoms include chest pain and shortness of breath (occasional). The current treatment provides moderate improvement of lipids. Compliance problems include adherence to diet and adherence to exercise.  Risk factors for coronary artery disease include diabetes mellitus, dyslipidemia, hypertension and obesity.  Diabetes He presents for his follow-up diabetic visit. He has type 2 diabetes mellitus. No MedicAlert identification noted. His disease course has been stable. Pertinent negatives for hypoglycemia include no headaches. Associated symptoms include chest pain. Pertinent negatives for diabetes include no polydipsia, no polyphagia, no polyuria and no visual change. There are no hypoglycemic complications. Symptoms are stable. There are no diabetic complications. Risk factors for coronary artery disease include diabetes mellitus, dyslipidemia and  hypertension. Current diabetic treatment includes oral agent (monotherapy). His weight is stable. When asked about meal planning, he reported none. He has not had a previous visit with a dietitian. His breakfast blood glucose is taken between 8-9 am. His breakfast blood glucose range is generally 140-180 mg/dl. His overall blood glucose range is 140-180 mg/dl. An ACE inhibitor/angiotensin II receptor blocker is being taken. He does not see a podiatrist.Eye exam is not current.  BPH currrently on hytrin and proscar- no trouble passing his water- sees urologist. depression celexa working well to keep him from worrying all the time. GERD/Hialtal hernia Omeprazole working well to keep symptoms under control B12 anemia injections monthly- no c/o fatigue Chronic back pain and neck pain Fentanyl patch and percocet- patient has been on for awhile - is not a candidate for surgery.     Review of Systems  Constitutional: Negative.   HENT: Negative.   Respiratory: Positive for shortness of breath (occasional).   Cardiovascular: Positive for chest pain. Negative for palpitations.  Endocrine: Negative for polydipsia, polyphagia and polyuria.  Genitourinary: Positive for frequency and difficulty urinating.  Skin: Negative.   Neurological: Negative for headaches.  Psychiatric/Behavioral: Negative.   All other systems reviewed and are negative.      Objective:   Physical Exam  Constitutional: He is oriented to person, place, and time. He appears well-developed and well-nourished.  HENT:  Head: Normocephalic.  Right Ear: External ear normal.  Left Ear: External ear normal.  Nose: Nose normal.  Mouth/Throat: Oropharynx is clear and moist.  Eyes: EOM are normal. Pupils are equal, round, and reactive to light.  Neck: Normal range of motion. Neck supple. No JVD present. No thyromegaly present.  Cardiovascular: Normal rate, regular rhythm, normal heart sounds and intact distal pulses.  Exam reveals  no gallop and no friction rub.  No murmur heard. Pulmonary/Chest: Effort normal and breath sounds normal. No respiratory distress. He has no wheezes. He has no rales. He exhibits no tenderness.  Shallow breathing  Abdominal: Soft. Bowel sounds are normal. He exhibits no mass. There is no tenderness.  Genitourinary:  Prostate exam deferred to urologist  Musculoskeletal: Normal range of motion. He exhibits no edema.  Lymphadenopathy:    He has no cervical adenopathy.  Neurological: He is alert and oriented to person, place, and time. No cranial nerve deficit.  Skin: Skin is warm and dry.  Psychiatric: He has a normal mood and affect. His behavior is normal. Judgment and thought content normal.    BP 129/81 mmHg  Pulse 94  Temp(Src) 96.1 F (35.6 C) (Oral)  Ht '5\' 8"'  (1.727 m)  Wt 234 lb (106.142 kg)  BMI 35.59 kg/m2    Results for orders placed or performed in visit on 08/04/15  POCT glycosylated hemoglobin (Hb A1C)  Result Value Ref Range   Hemoglobin A1C 7.3     EKG- sinus rhythym with occasional Rhys Martini, FNP        Assessment & Plan:   1. Type 2 diabetes mellitus without complication Stricter carbs counting - POCT glycosylated hemoglobin (Hb A1C) - metFORMIN (GLUCOPHAGE-XR) 500 MG 24 hr tablet; Take 1 tablet (500 mg total) by mouth daily with breakfast.  Dispense: 180 tablet; Refill: 1  2. Hyperlipidemia Low fat diet - Lipid panel - rosuvastatin (CRESTOR) 5 MG tablet; Take 1 tablet (5 mg total) by mouth daily.  Dispense: 30 tablet; Refill: 5  3. Essential hypertension Do not add salt to diet - CMP14+EGFR - losartan (COZAAR) 50 MG tablet; Take 1 tablet (50 mg total) by mouth daily.  Dispense: 90 tablet; Refill: 1 - amLODipine (NORVASC) 5 MG tablet; Take 1 tablet (5 mg total) by mouth daily.  Dispense: 90 tablet; Refill: 1  4. Chest pain, unspecified chest pain type To EAR if chest oain reoccurs - EKG 12-Lead  5. Coronary artery disease  involving nonautologous biological coronary bypass graft without angina pectoris Keep folllow up with cardiologist  6. Diabetic polyneuropathy associated with type 2 diabetes mellitus Do not go barefooted  7. Kidney disease 8. Proliferative diabetic retinopathy without macular edema associated with type 2 diabetes mellitus Eye exam yearly  9. BMI 33.0-33.9,adult Discussed diet and exercise for person with BMI >25 Will recheck weight in 3-6 months   10. Chronic back pain - fentaNYL (DURAGESIC) 50 MCG/HR; Place 1 patch (50 mcg total) onto the skin every 3 (three) days.  Dispense: 10 patch; Refill: 0  11. BPH (benign prostatic hyperplasia) Keep follow up with urologist - finasteride (PROSCAR) 5 MG tablet; Take 1 tablet (5 mg total) by mouth daily.  Dispense: 90 tablet; Refill: 1  12. Gastroesophageal reflux disease without esophagitis Avoid spicy foods Do not eat 2 hours prior to bedtime  - omeprazole (PRILOSEC) 20 MG capsule; Take 1 capsule (20 mg total) by mouth daily.  Dispense: 90 capsule; Refill: 1  13. Anxiety Stress management - citalopram (CELEXA) 20 MG tablet; Take 1 tablet (20 mg total) by mouth daily.  Dispense: 30 tablet; Refill: 5  14. Peripheral edema elevate legs when sitting - furosemide (LASIX) 40 MG tablet; Take 1 tablet (40 mg total) by mouth daily.  Dispense: 90 tablet; Refill: 1    Labs pending Health maintenance reviewed Diet and exercise encouraged Continue all meds Follow up  In 3 months   [place], FNP

## 2015-08-04 NOTE — Addendum Note (Signed)
Addended by: [name] on: 08/04/2015 02:43 PM   Modules accepted: Orders

## 2015-08-04 NOTE — Patient Instructions (Signed)

## 2015-08-05 LAB — CMP14+EGFR
ALT: 20 IU/L (ref 0–44)
AST: 18 IU/L (ref 0–40)
Albumin/Globulin Ratio: 1.7 (ref 1.1–2.5)
Albumin: 4 g/dL (ref 3.5–4.7)
Alkaline Phosphatase: 62 IU/L (ref 39–117)
BUN / CREAT RATIO: 15 (ref 10–22)
BUN: 21 mg/dL (ref 8–27)
Bilirubin Total: 0.3 mg/dL (ref 0.0–1.2)
CHLORIDE: 99 mmol/L (ref 97–108)
CO2: 30 mmol/L — ABNORMAL HIGH (ref 18–29)
Calcium: 8.9 mg/dL (ref 8.6–10.2)
Creatinine, Ser: 1.38 mg/dL — ABNORMAL HIGH (ref 0.76–1.27)
GFR, EST AFRICAN AMERICAN: 55 mL/min/{1.73_m2} — AB (ref >59)
GFR, EST NON AFRICAN AMERICAN: 47 mL/min/{1.73_m2} — AB (ref >59)
Globulin, Total: 2.3 g/dL (ref 1.5–4.5)
Glucose: 235 mg/dL — ABNORMAL HIGH (ref 65–99)
POTASSIUM: 4.7 mmol/L (ref 3.5–5.2)
Sodium: 140 mmol/L (ref 134–144)
Total Protein: 6.3 g/dL (ref 6.0–8.5)

## 2015-08-05 LAB — LIPID PANEL
CHOL/HDL RATIO: 3.3 ratio (ref 0.0–5.0)
Cholesterol, Total: 167 mg/dL (ref 100–199)
HDL: 51 mg/dL (ref >39)
LDL Calculated: 84 mg/dL (ref 0–99)
Triglycerides: 159 mg/dL — ABNORMAL HIGH (ref 0–149)
VLDL Cholesterol Cal: 32 mg/dL (ref 5–40)

## 2015-09-13 ENCOUNTER — Encounter: Payer: Self-pay | Admitting: Nurse Practitioner

## 2015-09-13 ENCOUNTER — Ambulatory Visit (HOSPITAL_COMMUNITY)
Admission: RE | Admit: 2015-09-13 | Discharge: 2015-09-13 | Disposition: A | Discharge status: Home / Self Care | Payer: Commercial Managed Care - HMO | Source: Ambulatory Visit | Attending: Nurse Practitioner | Admitting: Nurse Practitioner

## 2015-09-13 ENCOUNTER — Emergency Department (HOSPITAL_COMMUNITY)
Admission: EM | Admit: 2015-09-13 | Discharge: 2015-09-13 | Disposition: A | Discharge status: Home / Self Care | Payer: Commercial Managed Care - HMO | Attending: Emergency Medicine | Admitting: Emergency Medicine

## 2015-09-13 ENCOUNTER — Ambulatory Visit (INDEPENDENT_AMBULATORY_CARE_PROVIDER_SITE_OTHER): Payer: Commercial Managed Care - HMO | Admitting: Nurse Practitioner

## 2015-09-13 ENCOUNTER — Encounter (HOSPITAL_COMMUNITY): Payer: Self-pay

## 2015-09-13 VITALS — BP 107/67 | HR 98 | Temp 96.8°F | Ht 68.0 in | Wt 235.0 lb

## 2015-09-13 DIAGNOSIS — M791 Myalgia: Secondary | ICD-10-CM | POA: Diagnosis not present

## 2015-09-13 DIAGNOSIS — Z87891 Personal history of nicotine dependence: Secondary | ICD-10-CM | POA: Insufficient documentation

## 2015-09-13 DIAGNOSIS — N189 Chronic kidney disease, unspecified: Secondary | ICD-10-CM | POA: Insufficient documentation

## 2015-09-13 DIAGNOSIS — I2510 Atherosclerotic heart disease of native coronary artery without angina pectoris: Secondary | ICD-10-CM | POA: Diagnosis not present

## 2015-09-13 DIAGNOSIS — M79662 Pain in left lower leg: Secondary | ICD-10-CM | POA: Diagnosis not present

## 2015-09-13 DIAGNOSIS — Z8739 Personal history of other diseases of the musculoskeletal system and connective tissue: Secondary | ICD-10-CM | POA: Diagnosis not present

## 2015-09-13 DIAGNOSIS — G8929 Other chronic pain: Secondary | ICD-10-CM | POA: Diagnosis not present

## 2015-09-13 DIAGNOSIS — Z7982 Long term (current) use of aspirin: Secondary | ICD-10-CM | POA: Insufficient documentation

## 2015-09-13 DIAGNOSIS — Z79899 Other long term (current) drug therapy: Secondary | ICD-10-CM | POA: Diagnosis not present

## 2015-09-13 DIAGNOSIS — E119 Type 2 diabetes mellitus without complications: Secondary | ICD-10-CM | POA: Diagnosis not present

## 2015-09-13 DIAGNOSIS — G4730 Sleep apnea, unspecified: Secondary | ICD-10-CM | POA: Insufficient documentation

## 2015-09-13 DIAGNOSIS — M79605 Pain in left leg: Secondary | ICD-10-CM | POA: Diagnosis present

## 2015-09-13 DIAGNOSIS — Z8546 Personal history of malignant neoplasm of prostate: Secondary | ICD-10-CM | POA: Diagnosis not present

## 2015-09-13 DIAGNOSIS — I129 Hypertensive chronic kidney disease with stage 1 through stage 4 chronic kidney disease, or unspecified chronic kidney disease: Secondary | ICD-10-CM | POA: Insufficient documentation

## 2015-09-13 DIAGNOSIS — I82402 Acute embolism and thrombosis of unspecified deep veins of left lower extremity: Secondary | ICD-10-CM | POA: Diagnosis not present

## 2015-09-13 DIAGNOSIS — M549 Dorsalgia, unspecified: Secondary | ICD-10-CM | POA: Diagnosis not present

## 2015-09-13 DIAGNOSIS — E785 Hyperlipidemia, unspecified: Secondary | ICD-10-CM | POA: Insufficient documentation

## 2015-09-13 DIAGNOSIS — K219 Gastro-esophageal reflux disease without esophagitis: Secondary | ICD-10-CM | POA: Diagnosis not present

## 2015-09-13 LAB — CBC WITH DIFFERENTIAL/PLATELET
BASOS ABS: 0 10*3/uL (ref 0.0–0.1)
Basophils Relative: 1 %
EOS ABS: 0.1 10*3/uL (ref 0.0–0.7)
Eosinophils Relative: 1 %
HCT: 39 % (ref 39.0–52.0)
HEMOGLOBIN: 12.8 g/dL — AB (ref 13.0–17.0)
Lymphocytes Relative: 47 %
Lymphs Abs: 3.8 10*3/uL (ref 0.7–4.0)
MCH: 28.1 pg (ref 26.0–34.0)
MCHC: 32.8 g/dL (ref 30.0–36.0)
MCV: 85.7 fL (ref 78.0–100.0)
MONOS PCT: 4 %
Monocytes Absolute: 0.3 10*3/uL (ref 0.1–1.0)
Neutro Abs: 3.8 10*3/uL (ref 1.7–7.7)
Neutrophils Relative %: 47 %
Platelets: 157 10*3/uL (ref 150–400)
RBC: 4.55 MIL/uL (ref 4.22–5.81)
RDW: 14.9 % (ref 11.5–15.5)
WBC: 8 10*3/uL (ref 4.0–10.5)

## 2015-09-13 LAB — BASIC METABOLIC PANEL
Anion gap: 7 (ref 5–15)
BUN: 21 mg/dL — ABNORMAL HIGH (ref 6–20)
CO2: 30 mmol/L (ref 22–32)
Calcium: 8.9 mg/dL (ref 8.9–10.3)
Chloride: 101 mmol/L (ref 101–111)
Creatinine, Ser: 1.72 mg/dL — ABNORMAL HIGH (ref 0.61–1.24)
GFR calc Af Amer: 41 mL/min — ABNORMAL LOW (ref >60)
GFR calc non Af Amer: 35 mL/min — ABNORMAL LOW (ref >60)
Glucose, Bld: 125 mg/dL — ABNORMAL HIGH (ref 65–99)
Potassium: 4.8 mmol/L (ref 3.5–5.1)
Sodium: 138 mmol/L (ref 135–145)

## 2015-09-13 MED ORDER — WARFARIN - PHARMACIST DOSING INPATIENT
Freq: Every day | Status: DC
  Administered 2015-09-13: 16:00:00

## 2015-09-13 MED ORDER — ENOXAPARIN SODIUM 150 MG/ML ~~LOC~~ SOLN
150.0000 mg | SUBCUTANEOUS | Status: DC
  Administered 2015-09-13: 150 mg via SUBCUTANEOUS
  Filled 2015-09-13: qty 1

## 2015-09-13 MED ORDER — WARFARIN SODIUM 5 MG PO TABS: ORAL_TABLET | ORAL | Status: DC

## 2015-09-13 MED ORDER — FENTANYL 50 MCG/HR TD PT72: 50.0000 ug | MEDICATED_PATCH | TRANSDERMAL | Status: DC

## 2015-09-13 MED ORDER — ENOXAPARIN SODIUM 100 MG/ML ~~LOC~~ SOLN: 150.0000 mg | SUBCUTANEOUS | Status: DC

## 2015-09-13 MED ORDER — WARFARIN SODIUM 5 MG PO TABS
5.0000 mg | ORAL_TABLET | Freq: Once | ORAL | Status: AC
  Administered 2015-09-13: 5 mg via ORAL
  Filled 2015-09-13: qty 1

## 2015-09-13 NOTE — Progress Notes (Signed)
   Subjective:    Patient ID: Kaius JAHMARION POPOFF, male    DOB: 12/08/1930, 81 y.o.   MRN: [id number]  HPI Patient in c/o left calf pain that started 2 weeks ago- pain can get as bad as 10/10-Pain is intermittent pain worse at night. Has history of blood clot in that calf over 10 years ago.  * Chronic back pain- uses fentanyl patch - helps with pain  Review of Systems  Constitutional: Negative.   HENT: Negative.   Respiratory: Negative.   Cardiovascular: Negative.   Gastrointestinal: Negative.   Genitourinary: Negative.   Neurological: Negative.   Psychiatric/Behavioral: Negative.   All other systems reviewed and are negative.      Objective:   Physical Exam  Constitutional: He is oriented to person, place, and time. He appears well-developed and well-nourished.  Cardiovascular: Normal rate and normal heart sounds.   Pulmonary/Chest: Effort normal and breath sounds normal.  Musculoskeletal:  Left calf pain on palpation- mild edema- negative homan sign  Neurological: He is alert and oriented to person, place, and time.  Skin: Skin is warm.  Psychiatric: He has a normal mood and affect. His behavior is normal. Judgment and thought content normal.    BP 107/67 mmHg  Pulse 98  Temp(Src) 96.8 F (36 C) (Oral)  Ht 5\' 8"  (1.727 m)  Wt 235 lb (106.595 kg)  BMI 35.74 kg/m2       Assessment & Plan:  1. Calf pain, left Will wait on report - US Venous Img Lower Unilateral Left; Future  2. Chronic back pain Moist heat to back No heavy lifting - fentaNYL (DURAGESIC) 50 MCG/HR; Place 1 patch (50 mcg total) onto the skin every 3 (three) days.  Dispense: 10 patch; Refill: 0  Mary-Margaret Hassell Done, FNP

## 2015-09-13 NOTE — Progress Notes (Addendum)
CM informed by ED MD [place] that pt cannot discharge on Xarelto due to kidney function. Pt now to discharge home with lovenox and coumadin. ED RN to demonstrate lovenox administration and administer dose prior to discharge from ED. [place] RN arranged with [facility] (per pts choice) to start on 09/14/15 for daily PT/INR and lovenox administration. CM called pts PCP and arranged follow up appt for pt and notified pt and pts nurse. [place] RN referral called to Romualdo Bolk of [facility] who will collect pts information from the chart.

## 2015-09-13 NOTE — Discharge Instructions (Addendum)
Information on my medicine - Coumadin®   (Warfarin) ° °This medication education was reviewed with me or my healthcare representative as part of my discharge preparation. ° °Why was Coumadin prescribed for you? °Coumadin was prescribed for you because you have a blood clot or a medical condition that can cause an increased risk of forming blood clots. Blood clots can cause serious health problems by blocking the flow of blood to the heart, lung, or brain. Coumadin can prevent harmful blood clots from forming. °As a reminder your indication for Coumadin is:   Deep Vein Thrombosis Treatment ° °What test will check on my response to Coumadin? °While on Coumadin (warfarin) you will need to have an INR test regularly to ensure that your dose is keeping you in the desired range. The INR (international normalized ratio) number is calculated from the result of the laboratory test called prothrombin time (PT). ° °If an INR APPOINTMENT HAS NOT ALREADY BEEN MADE FOR YOU please schedule an appointment to have this lab work done by your health care provider within 7 days. °Your INR goal is usually a number between:  2 to 3 or your provider may give you a more narrow range like 2-2.5.  Ask your health care provider during an office visit what your goal INR is. ° °What  do you need to  know  About  COUMADIN? °Take Coumadin (warfarin) exactly as prescribed by your healthcare provider about the same time each day.  DO NOT stop taking without talking to the doctor who prescribed the medication.  Stopping without other blood clot prevention medication to take the place of Coumadin may increase your risk of developing a new clot or stroke.  Get refills before you run out. ° °What do you do if you miss a dose? °If you miss a dose, take it as soon as you remember on the same day then continue your regularly scheduled regimen the next day.  Do not take two doses of Coumadin at the same time. ° °Important Safety Information °A possible  side effect of Coumadin (Warfarin) is an increased risk of bleeding. You should call your healthcare provider right away if you experience any of the following: °? Bleeding from an injury or your nose that does not stop. °? Unusual colored urine (red or dark brown) or unusual colored stools (red or black). °? Unusual bruising for unknown reasons. °? A serious fall or if you hit your head (even if there is no bleeding). ° °Some foods or medicines interact with Coumadin® (warfarin) and might alter your response to warfarin. To help avoid this: °? Eat a balanced diet, maintaining a consistent amount of Vitamin K. °? Notify your provider about major diet changes you plan to make. °? Avoid alcohol or limit your intake to 1 drink for women and 2 drinks for men per day. °(1 drink is 5 oz. wine, 12 oz. beer, or 1.5 oz. liquor.) ° °Make sure that ANY health care provider who prescribes medication for you knows that you are taking Coumadin (warfarin).  Also make sure the healthcare provider who is monitoring your Coumadin knows when you have started a new medication including herbals and non-prescription products. ° °Coumadin® (Warfarin)  Major Drug Interactions  °Increased Warfarin Effect Decreased Warfarin Effect  °Alcohol (large quantities) °Antibiotics (esp. Septra/Bactrim, Flagyl, Cipro) °Amiodarone (Cordarone) °Aspirin (ASA) °Cimetidine (Tagamet) °Megestrol (Megace) °NSAIDs (ibuprofen, naproxen, etc.) °Piroxicam (Feldene) °Propafenone (Rythmol SR) °Propranolol (Inderal) °Isoniazid (INH) °Posaconazole (Noxafil) Barbiturates (Phenobarbital) °Carbamazepine (Tegretol) °Chlordiazepoxide (Librium) °  Cholestyramine (Questran) Griseofulvin Oral Contraceptives Rifampin Sucralfate (Carafate) Vitamin K   Coumadin (Warfarin) Major Herbal Interactions  Increased Warfarin Effect Decreased Warfarin Effect  Garlic Ginseng Ginkgo biloba Coenzyme Q10 Green tea St. Johns wort    Coumadin (Warfarin) FOOD Interactions  Eat  a consistent number of servings per week of foods HIGH in Vitamin K (1 serving =  cup)  Collards (cooked, or boiled & drained) Kale (cooked, or boiled & drained) Mustard greens (cooked, or boiled & drained) Parsley *serving size only =  cup Spinach (cooked, or boiled & drained) Swiss chard (cooked, or boiled & drained) Turnip greens (cooked, or boiled & drained)  Eat a consistent number of servings per week of foods MEDIUM-HIGH in Vitamin K (1 serving = 1 cup)  Asparagus (cooked, or boiled & drained) Broccoli (cooked, boiled & drained, or raw & chopped) Brussel sprouts (cooked, or boiled & drained) *serving size only =  cup Lettuce, raw (green leaf, endive, romaine) Spinach, raw Turnip greens, raw & chopped   These websites have more information on Coumadin (warfarin):  FailFactory.se; VeganReport.com.au;   Deep Vein Thrombosis A deep vein thrombosis (DVT) is a blood clot that develops in the deep, larger veins of the leg, arm, or pelvis. These are more dangerous than clots that might form in veins near the surface of the body. A DVT can lead to serious and even life-threatening complications if the clot breaks off and travels in the bloodstream to the lungs.  A DVT can damage the valves in your leg veins so that instead of flowing upward, the blood pools in the lower leg. This is called post-thrombotic syndrome, and it can result in pain, swelling, discoloration, and sores on the leg. CAUSES Usually, several things contribute to the formation of blood clots. Contributing factors include:  The flow of blood slows down.  The inside of the vein is damaged in some way.  You have a condition that makes blood clot more easily. RISK FACTORS Some people are more likely than others to develop blood clots. Risk factors include:   Smoking.  Being overweight (obese).  Sitting or lying still for a long time. This includes long-distance travel, paralysis, or recovery  from an illness or surgery. Other factors that increase risk are:   Older age, especially over 37 years of age.  Having a family history of blood clots or if you have already had a blot clot.  Having major or lengthy surgery. This is especially true for surgery on the hip, knee, or belly (abdomen). Hip surgery is particularly high risk.  Having a long, thin tube (catheter) placed inside a vein during a medical procedure.  Breaking a hip or leg.  Having cancer or cancer treatment.  Pregnancy and childbirth.  Hormone changes make the blood clot more easily during pregnancy.  The fetus puts pressure on the veins of the pelvis.  There is a risk of injury to veins during delivery or a caesarean delivery. The risk is highest just after childbirth.  Medicines containing the male hormone estrogen. This includes birth control pills and hormone replacement therapy.  Other circulation or heart problems.  SIGNS AND SYMPTOMS When a clot forms, it can either partially or totally block the blood flow in that vein. Symptoms of a DVT can include:  Swelling of the leg or arm, especially if one side is much worse.  Warmth and redness of the leg or arm, especially if one side is much worse.  Pain in an arm  or leg. If the clot is in the leg, symptoms may be more noticeable or worse when standing or walking. The symptoms of a DVT that has traveled to the lungs (pulmonary embolism, PE) usually start suddenly and include:  Shortness of breath.  Coughing.  Coughing up blood or blood-tinged mucus.  Chest pain. The chest pain is often worse with deep breaths.  Rapid heartbeat. Anyone with these symptoms should get emergency medical treatment right away. Do not wait to see if the symptoms will go away. Call your local emergency services (911 in the [place] if you have these symptoms. Do not drive yourself to the hospital. DIAGNOSIS If a DVT is suspected, your health care provider will take a full  medical history and perform a physical exam. Tests that also may be required include:  Blood tests, including studies of the clotting properties of the blood.  Ultrasound to see if you have clots in your legs or lungs.  X-rays to show the flow of blood when dye is injected into the veins (venogram).  Studies of your lungs if you have any chest symptoms. PREVENTION  Exercise the legs regularly. Take a brisk 30-minute walk every day.  Maintain a weight that is appropriate for your height.  Avoid sitting or lying in bed for long periods of time without moving your legs.  Women, particularly those over the age of 80 years, should consider the risks and benefits of taking estrogen medicines, including birth control pills.  Do not smoke, especially if you take estrogen medicines.  Long-distance travel can increase your risk of DVT. You should exercise your legs by walking or pumping the muscles every hour.  Many of the risk factors above relate to situations that exist with hospitalization, either for illness, injury, or elective surgery. Prevention may include medical and nonmedical measures.  Your health care provider will assess you for the need for venous thromboembolism prevention when you are admitted to the hospital. If you are having surgery, your surgeon will assess you the day of or day after surgery. TREATMENT Once identified, a DVT can be treated. It can also be prevented in some circumstances. Once you have had a DVT, you may be at increased risk for a DVT in the future. The most common treatment for DVT is blood-thinning (anticoagulant) medicine, which reduces the blood's tendency to clot. Anticoagulants can stop new blood clots from forming and stop old clots from growing. They cannot dissolve existing clots. Your body does this by itself over time. Anticoagulants can be given by mouth, through an IV tube, or by injection. Your health care provider will determine the best program  for you. Other medicines or treatments that may be used are:  Heparin or related medicines (low molecular weight heparin) are often the first treatment for a blood clot. They act quickly. However, they cannot be taken orally and must be given either in shot form or by IV tube.  Heparin can cause a fall in a component of blood that stops bleeding and forms blood clots (platelets). You will be monitored with blood tests to be sure this does not occur.  Warfarin is an anticoagulant that can be swallowed. It takes a few days to start working, so usually heparin or related medicines are used in combination. Once warfarin is working, heparin is usually stopped.  Factor Xa inhibitor medicines, such as rivaroxaban and apixaban, also reduce blood clotting. These medicines are taken orally and can often be used without heparin or related  medicines.  Less commonly, clot dissolving drugs (thrombolytics) are used to dissolve a DVT. They carry a high risk of bleeding, so they are used mainly in severe cases where your life or a part of your body is threatened.  Very rarely, a blood clot in the leg needs to be removed surgically.  If you are unable to take anticoagulants, your health care provider may arrange for you to have a filter placed in a main vein in your abdomen. This filter prevents clots from traveling to your lungs. HOME CARE INSTRUCTIONS  Take all medicines as directed by your health care provider.  Learn as much as you can about DVT.  Wear a medical alert bracelet or carry a medical alert card.  Ask your health care provider how soon you can go back to normal activities. It is important to stay active to prevent blood clots. If you are on anticoagulant medicine, avoid contact sports.  It is very important to exercise. This is especially important while traveling, sitting, or standing for long periods of time. Exercise your legs by walking or by tightening and relaxing your leg muscles  regularly. Take frequent walks.  You may need to wear compression stockings. These are tight elastic stockings that apply pressure to the lower legs. This pressure can help keep the blood in the legs from clotting. Taking Warfarin Warfarin is a daily medicine that is taken by mouth. Your health care provider will advise you on the length of treatment (usually 3-6 months, sometimes lifelong). If you take warfarin:  Understand how to take warfarin and foods that can affect how warfarin works in Veterinary surgeon.  Too much and too little warfarin are both dangerous. Too much warfarin increases the risk of bleeding. Too little warfarin continues to allow the risk for blood clots. Warfarin and Regular Blood Testing While taking warfarin, you will need to have regular blood tests to measure your blood clotting time. These blood tests usually include both the prothrombin time (PT) and international normalized ratio (INR) tests. The PT and INR results allow your health care provider to adjust your dose of warfarin. It is very important that you have your PT and INR tested as often as directed by your health care provider.  Warfarin and Your Diet Avoid major changes in your diet, or notify your health care provider before changing your diet. Arrange a visit with a registered dietitian to answer your questions. Many foods, especially foods high in vitamin K, can interfere with warfarin and affect the PT and INR results. You should eat a consistent amount of foods high in vitamin K. Foods high in vitamin K include:   Spinach, kale, broccoli, cabbage, collard and turnip greens, Brussels sprouts, peas, cauliflower, seaweed, and parsley.  Beef and pork liver.  Green tea.  Soybean oil. Warfarin with Other Medicines Many medicines can interfere with warfarin and affect the PT and INR results. You must:  Tell your health care provider about any and all medicines, vitamins, and supplements you take, including  aspirin and other over-the-counter anti-inflammatory medicines. Be especially cautious with aspirin and anti-inflammatory medicines. Ask your health care provider before taking these.  Do not take or discontinue any prescribed or over-the-counter medicine except on the advice of your health care provider or pharmacist. Warfarin Side Effects Warfarin can have side effects, such as easy bruising and difficulty stopping bleeding. Ask your health care provider or pharmacist about other side effects of warfarin. You will need to:  Hold pressure over  cuts for longer than usual.  Notify your dentist and other health care providers that you are taking warfarin before you undergo any procedures where bleeding may occur. Warfarin with Alcohol and Tobacco   Drinking alcohol frequently can increase the effect of warfarin, leading to excess bleeding. It is best to avoid alcoholic drinks or to consume only very small amounts while taking warfarin. Notify your health care provider if you change your alcohol intake.   Do not use any tobacco products including cigarettes, chewing tobacco, or electronic cigarettes. If you smoke, quit. Ask your health care provider for help with quitting smoking. Alternative Medicines to Warfarin: Factor Xa Inhibitor Medicines  These blood-thinning medicines are taken by mouth, usually for several weeks or longer. It is important to take the medicine every single day at the same time each day.  There are no regular blood tests required when using these medicines.  There are fewer food and drug interactions than with warfarin.  The side effects of this class of medicine are similar to those of warfarin, including excessive bruising or bleeding. Ask your health care provider or pharmacist about other potential side effects. SEEK MEDICAL CARE IF:  You notice a rapid heartbeat.  You feel weaker or more tired than usual.  You feel faint.  You notice increased  bruising.  You feel your symptoms are not getting better in the time expected.  You believe you are having side effects of medicine. SEEK IMMEDIATE MEDICAL CARE IF:  You have chest pain.  You have trouble breathing.  You have new or increased swelling or pain in one leg.  You cough up blood.  You notice blood in vomit, in a bowel movement, or in urine. MAKE SURE YOU:  Understand these instructions.  Will watch your condition.  Will get help right away if you are not doing well or get worse. Document Released: 12/16/2005 Document Revised: 05/02/2014 Document Reviewed: 08/23/2013 [facility] Patient Information 2015 [place], [place]. This information is not intended to replace advice given to you by your health care provider. Make sure you discuss any questions you have with your health care provider.

## 2015-09-13 NOTE — ED Notes (Addendum)
Pt sent there from PCP for eval of pain in the  left leg lower leg. Per pt he has a clot and his doctor wants him eval in the ED

## 2015-09-13 NOTE — Progress Notes (Signed)
CM called to Ed in regards to medication assistance. Pt has DVT and has been ordered Xarelto. Pt given 30 day free Xarelto card. CM did explain to pt that the card would only help for 1 month. CM also explained to pt that if copay amounts after the 30 days free, pt could call the drug company for medication assistance or contact PCP for a cheaper medication. Pt verbalized understanding. Cm also explained above to pts bedside RN. No furthur CM needs noted.

## 2015-09-13 NOTE — Progress Notes (Signed)
ANTICOAGULATION CONSULT NOTE - Initial Consult  Pharmacy Consult for Lovenox and Coumadin Indication: DVT  Allergies  Allergen Reactions  . Actos [Pioglitazone]     Allergic reaction  . Bactrim   . Celebrex [Celecoxib]   . Doxycycline     Caused black spots in his eyes  . Flagyl [Metronidazole Hcl]   . Gabapentin   . Lyrica [Pregabalin]   . Lyrica [Pregabalin]     Increase appetite   . Morphine And Related   . Motrin [Ibuprofen]   . Nsaids   . Nsaids     Swelling    . Ultram [Tramadol Hcl]   . Ultram [Tramadol Hcl]     Itching / rash     . Vioxx [Rofecoxib]     Swelling    . Celebrex [Celecoxib] Rash    Patient Measurements:   Weight ~ 100-105Kg  Vital Signs: Temp: 98.1 F (36.7 C) (09/14 1439) Temp Source: Oral (09/14 1439) BP: 130/57 mmHg (09/14 1439) Pulse Rate: 84 (09/14 1439)  Labs:  Recent Labs  09/13/15 1445  HGB 12.8*  HCT 39.0  PLT 157  CREATININE 1.72*    Estimated Creatinine Clearance: 38.5 mL/min (by C-G formula based on Cr of 1.72). Due to obesity, Normalized ClCr ~ 30ml/min  Medical History: Past Medical History  Diagnosis Date  . DVT of leg (deep venous thrombosis) 1999    RLE   . Hypertension   . Hiatal hernia   . Thyroid nodule   . Goiter     stable x many year - last endo exam 08/2010  . Sleep apnea   . Type 2 diabetes mellitus   . Chronic kidney disease   . Diabetic retinopathy     right eye   . Diabetic neuropathy   . Osteopenia   . CAD (coronary artery disease)     (Left main normal, LAD 30-40% mid stenosis, diagonal 30-40% stenosis, circumflex 30-40% stenosis, right coronary artery 30-40% stenosis. 2003.)  . Sleep apnea     CPAP  . GERD (gastroesophageal reflux disease)   . Diabetes mellitus   . Hyperlipidemia   . HTN (hypertension)   . Prostate cancer   . Chronic back pain   . DVT (deep venous thrombosis)   . Lumbar spondylosis    Assessment: 81yo male who presented to ED with DVT.  SCr elevated and ClCr  borderline for Xarelto Rx therefore decided to initiate Lovenox and Coumadin for OP treatment.    Goal of Therapy:  INR 2-3 Monitor platelets by anticoagulation protocol: Yes   Plan:  Lovenox 1.5mg /Kg (150mg ) SQ once daily (q24h) Coumadin 5mg  po today x 1 INR daily starting tomorrow, to be evaluated by PCP and Coumadin dosed by PCP Overlap Lovenox / Coumadin at least 5 days or until INR > 2 x 24hrs  Monitor for s/sx of bleeding complications  Hart Robinsons A 09/13/2015,4:03 PM

## 2015-09-13 NOTE — ED Notes (Signed)
Pt c/o left lower leg pain that starts behind the knee and goes down into the ankle for several weeks now. LLE is swollen as compared to the RLE. LLE pulses are 3+. Pt says the pain is worse with certain movements. Pt has hx of DVT in right leg years ago and feels that's what he has in his left leg now.

## 2015-09-13 NOTE — ED Provider Notes (Signed)
CSN: [id number]     Arrival date & time 09/13/15  1423 History   First MD Initiated Contact with Patient 09/13/15 1430     Chief Complaint  Patient presents with  . DVT    Patient is a 81 y.o. male presenting with leg pain. The history is provided by the patient.  Leg Pain Location:  Leg Leg location:  L leg Pain details:    Quality:  Aching   Radiates to:  Does not radiate   Severity:  Moderate   Onset quality:  Gradual   Duration:  2 weeks   Timing:  Rai   Progression:  Worsening Chronicity:  New Relieved by:  Rest Exacerbated by: movement. Associated symptoms: no fever   pt report pain/swelling to left LE for 2 weeks He does not recall any injury to leg No falls/trauma No CP No new SOB is reported   Past Medical History  Diagnosis Date  . DVT of leg (deep venous thrombosis) 1999    RLE   . Hypertension   . Hiatal hernia   . Thyroid nodule   . Goiter     stable x many year - last endo exam 08/2010  . Sleep apnea   . Type 2 diabetes mellitus   . Chronic kidney disease   . Diabetic retinopathy     right eye   . Diabetic neuropathy   . Osteopenia   . CAD (coronary artery disease)     (Left main normal, LAD 30-40% mid stenosis, diagonal 30-40% stenosis, circumflex 30-40% stenosis, right coronary artery 30-40% stenosis. 2003.)  . Sleep apnea     CPAP  . GERD (gastroesophageal reflux disease)   . Diabetes mellitus   . Hyperlipidemia   . HTN (hypertension)   . Prostate cancer   . Chronic back pain   . DVT (deep venous thrombosis)   . Lumbar spondylosis    Past Surgical History  Procedure Laterality Date  . Tonsilectomy, adenoidectomy, bilateral myringotomy and tubes    . Inguinal hernia repair      bilateral  . Knee arthroscopy      left   . Biopsy of right ear    . Cholecystectomy    . Cataract extraction, bilateral    . Tonsillectomy and adenoidectomy    . Appendectomy    . Inguinal hernia repair      Bilateral   Family History  Problem  Relation Age of Onset  . Hyperlipidemia Mother   . Hypertension Mother   . Deep vein thrombosis Mother   . Prostate cancer Father   . Colon cancer Sister   . Cancer Father 28  . Pulmonary embolism Mother   . Diabetes Mother    Social History  Substance Use Topics  . Smoking status: Former Smoker    Types: Cigarettes, Pipe, Cigars    Quit date: 11/08/2003  . Smokeless tobacco: Former Systems developer    Quit date: 11/08/2003  . Alcohol Use: No    Review of Systems  Constitutional: Negative for fever.  Cardiovascular: Negative for chest pain.  Gastrointestinal: Negative for blood in stool.  Musculoskeletal: Positive for myalgias and arthralgias.  Neurological: Negative for weakness.  All other systems reviewed and are negative.     Allergies  Actos; Bactrim; Celebrex; Doxycycline; Flagyl; Gabapentin; Lyrica; Lyrica; Morphine and related; Motrin; Nsaids; Nsaids; Ultram; Ultram; Vioxx; and Celebrex  Home Medications   Prior to Admission medications   Medication Sig Start Date End Date Taking? Authorizing Provider  amLODipine (  NORVASC) 5 MG tablet Take 1 tablet (5 mg total) by mouth daily. 08/04/15   Mary-Margaret Hassell Done, FNP  aspirin (ASPIRIN CHILDRENS) 81 MG chewable tablet Chew 81 mg by mouth daily.      Historical Provider, MD  Cholecalciferol (VITAMIN D3) 2000 UNITS capsule Take 2,000 Units by mouth 2 (two) times daily.     Historical Provider, MD  citalopram (CELEXA) 20 MG tablet Take 1 tablet (20 mg total) by mouth daily. 08/04/15   Mary-Margaret Hassell Done, FNP  docusate sodium (STOOL SOFTENER) 100 MG capsule Take 300 mg by mouth 2 (two) times daily as needed for mild constipation.     Historical Provider, MD  fentaNYL (DURAGESIC) 50 MCG/HR Place 1 patch (50 mcg total) onto the skin every 3 (three) days. 09/13/15   Mary-Margaret Hassell Done, FNP  finasteride (PROSCAR) 5 MG tablet Take 1 tablet (5 mg total) by mouth daily. 08/04/15   Mary-Margaret Hassell Done, FNP  furosemide (LASIX) 40 MG tablet Take  1 tablet (40 mg total) by mouth daily. 08/04/15   Mary-Margaret Hassell Done, FNP  glucose blood (ACCU-CHEK AVIVA PLUS) test strip Test 2-3 X a day and prn   Dx. E11.9 01/03/15   Mary-Margaret Hassell Done, FNP  losartan (COZAAR) 50 MG tablet Take 1 tablet (50 mg total) by mouth daily. 08/04/15   Mary-Margaret Hassell Done, FNP  metFORMIN (GLUCOPHAGE-XR) 500 MG 24 hr tablet Take 1 tablet (500 mg total) by mouth daily with breakfast. 08/04/15   Mary-Margaret Hassell Done, FNP  Misc. Devices (HUGO ROLLING WALKER ELITE) MISC 1 each by Does not apply route daily. 08/04/15   Mary-Margaret Hassell Done, FNP  Omega-3 Fatty Acids (FISH OIL) 1200 MG CAPS Take 1 capsule by mouth 5 (five) times daily.    Historical Provider, MD  omeprazole (PRILOSEC) 20 MG capsule Take 1 capsule (20 mg total) by mouth daily. 08/04/15   Mary-Margaret Hassell Done, FNP  rosuvastatin (CRESTOR) 5 MG tablet Take 1 tablet (5 mg total) by mouth daily. 08/04/15   Mary-Margaret Hassell Done, FNP  Specialty Vitamins Products (MAGNESIUM, AMINO ACID CHELATE,) 133 MG tablet Take 1 tablet by mouth 2 (two) times daily.    Historical Provider, MD  terazosin (HYTRIN) 2 MG capsule Take 1 capsule (2 mg total) by mouth at bedtime. 08/04/15   Mary-Margaret Hassell Done, FNP  vitamin B-12 (CYANOCOBALAMIN) 1000 MCG tablet Take 1,000 mcg by mouth daily.    Historical Provider, MD   BP 130/57 mmHg  Pulse 84  Temp(Src) 98.1 F (36.7 C) (Oral)  Resp 18  SpO2 99% Physical Exam CONSTITUTIONAL: Well developed/well nourished HEAD: Normocephalic/atraumatic EYES: EOMI ENMT: Mucous membranes moist NECK: supple no meningeal signs CV: S1/S2 noted LUNGS: Lungs are clear to auscultation bilaterally, no apparent distress ABDOMEN: soft, nontender, no rebound or guarding, bowel sounds noted throughout abdomen NEURO: Pt is awake/alert/appropriate, moves all extremitiesx4.  No facial droop.   EXTREMITIES: pulses normal/equal, full ROM, tenderness to left calf without erythema/crepitus, pitting edema noted to left  LE SKIN: warm, color normal PSYCH: no abnormalities of mood noted, alert and oriented to situation  ED Course  Procedures  2:54 PM Pt found to have acute DVT on DVT study today D/w pharmacy will see patient Call to case management placed 4:39 PM Pt not a candidate for xarelto due to renal insufficiency D/w pharmacy recommend lovenox/coumadin Case management has arranged PCP Followup and home health to check INR tomorrow and given him lovenox daily Pt has been shown how to use meds at home Pt stable Pt awake/alert Appropriate for d/c home Discussed strict  return precautions and also signs of GI bleed were discussed  Labs Review Labs Reviewed  BASIC METABOLIC PANEL - Abnormal; Notable for the following:    Glucose, Bld 125 (*)    BUN 21 (*)    Creatinine, Ser 1.72 (*)    GFR calc non Af Amer 35 (*)    GFR calc Af Amer 41 (*)    All other components within normal limits  CBC WITH DIFFERENTIAL/PLATELET - Abnormal; Notable for the following:    Hemoglobin 12.8 (*)    All other components within normal limits    Imaging Review US Venous Img Lower Unilateral Left  09/13/2015   CLINICAL DATA:  81 year old male with left calf pain  EXAM: LEFT LOWER EXTREMITY VENOUS DOPPLER ULTRASOUND  TECHNIQUE: Gray-scale sonography with graded compression, as well as color Doppler and duplex ultrasound were performed to evaluate the lower extremity deep venous systems from the level of the common femoral vein and including the common femoral, femoral, profunda femoral, popliteal and calf veins including the posterior tibial, peroneal and gastrocnemius veins when visible. The superficial great saphenous vein was also interrogated. Spectral Doppler was utilized to evaluate flow at rest and with distal augmentation maneuvers in the common femoral, femoral and popliteal veins.  COMPARISON:  Prior duplex venous ultrasound 06/23/2014  FINDINGS: Contralateral Common Femoral Vein: Respiratory phasicity is normal  and symmetric with the symptomatic side. No evidence of thrombus. Normal compressibility.  Common Femoral Vein: No evidence of thrombus. Normal compressibility, respiratory phasicity and response to augmentation.  Saphenofemoral Junction: No evidence of thrombus. Normal compressibility and flow on color Doppler imaging.  Profunda Femoral Vein: No evidence of thrombus. Normal compressibility and flow on color Doppler imaging.  Femoral Vein: No evidence of thrombus. Normal compressibility, respiratory phasicity and response to augmentation.  Popliteal Vein: No evidence of thrombus. Normal compressibility, respiratory phasicity and response to augmentation.  Calf Veins: Gastrocnemius veins in the mid calf are noncompressible and demonstrate eccentric wall thickening. Findings are concerning for acute DVT. There is some evidence of internal flow on color Doppler imaging. The posterior tibial veins appear patent. The peroneal veins are not well seen.  Superficial Great Saphenous Vein: No evidence of thrombus. Normal compressibility and flow on color Doppler imaging.  Venous Reflux:  None.  Other Findings:  None.  IMPRESSION: 1. Positive for nonocclusive thrombus in the deep gastrocnemius veins of the mid calf. No evidence of thrombus extension into the popliteal vein, or above the knee.   Electronically Signed   By: Jacqulynn Cadet M.D.   On: 09/13/2015 14:06     MDM   Final diagnoses:  DVT (deep venous thrombosis), left    Nursing notes including past medical history and social history reviewed and considered in documentation Previous records reviewed and considered Labs/vital reviewed myself and considered during evaluation     Ripley Fraise, MD 09/13/15 1641

## 2015-09-13 NOTE — ED Notes (Signed)
MD at bedside. 

## 2015-09-13 NOTE — Patient Instructions (Signed)

## 2015-09-13 NOTE — ED Notes (Signed)
Pharmacist at bedside.

## 2015-09-14 ENCOUNTER — Telehealth: Payer: Self-pay | Admitting: *Deleted

## 2015-09-14 ENCOUNTER — Other Ambulatory Visit: Payer: Self-pay | Admitting: Pharmacist

## 2015-09-14 DIAGNOSIS — I82402 Acute embolism and thrombosis of unspecified deep veins of left lower extremity: Secondary | ICD-10-CM | POA: Insufficient documentation

## 2015-09-14 NOTE — Telephone Encounter (Signed)
[name] from [facility] called and INR 1.1   Patient was given lovenox 150mg  given now  And he takes warfarin 5mg  daily and took last night

## 2015-09-14 NOTE — Telephone Encounter (Signed)
Recommend take warfarin 5mg  - 2 tablets today.   Recheck INR tomorrow Will then advise about continued Lovenox and warfarin dosing.  Cheryl notified of recommendation - she was still at patient's home so she instructed him on above recommendations.

## 2015-09-15 ENCOUNTER — Telehealth: Payer: Self-pay | Admitting: Pharmacist

## 2015-09-15 ENCOUNTER — Ambulatory Visit (INDEPENDENT_AMBULATORY_CARE_PROVIDER_SITE_OTHER): Payer: Commercial Managed Care - HMO | Admitting: Pharmacist

## 2015-09-15 DIAGNOSIS — I82402 Acute embolism and thrombosis of unspecified deep veins of left lower extremity: Secondary | ICD-10-CM | POA: Diagnosis not present

## 2015-09-15 LAB — POCT INR: INR: 1.1

## 2015-09-15 NOTE — Telephone Encounter (Signed)
INR is 1.1 today.  Continue Lovenox 150mg  Inject once daily through weekend Also take warfarin 5mg  - 2 tablets (=10mg ) daily.  Recheck INR on Monday, September 19th.  Instructions given to home health nurse - Angie who was still at patient's home.  She gave instruction to patient.

## 2015-09-18 ENCOUNTER — Ambulatory Visit (INDEPENDENT_AMBULATORY_CARE_PROVIDER_SITE_OTHER): Payer: Commercial Managed Care - HMO | Admitting: Pharmacist

## 2015-09-18 ENCOUNTER — Telehealth: Payer: Self-pay | Admitting: *Deleted

## 2015-09-18 DIAGNOSIS — I82402 Acute embolism and thrombosis of unspecified deep veins of left lower extremity: Secondary | ICD-10-CM

## 2015-09-18 LAB — POCT INR: INR: 2.1

## 2015-09-18 MED ORDER — WARFARIN SODIUM 5 MG PO TABS: ORAL_TABLET | ORAL | Status: DC

## 2015-09-18 NOTE — Telephone Encounter (Signed)
INR 2.1  Will need coumadin called in at [facility]

## 2015-09-18 NOTE — Telephone Encounter (Signed)
OK to stop Lovenox.  Continue warfarin 5mg  at new dose of 1 tablet daily.  Recheck INR in 2 days.  Nurse with [facility] notified.  Tried to call patient but no answer.

## 2015-09-19 ENCOUNTER — Emergency Department (HOSPITAL_COMMUNITY): Payer: Commercial Managed Care - HMO

## 2015-09-19 ENCOUNTER — Encounter (HOSPITAL_COMMUNITY): Payer: Self-pay | Admitting: *Deleted

## 2015-09-19 ENCOUNTER — Encounter: Payer: Self-pay | Admitting: Family Medicine

## 2015-09-19 ENCOUNTER — Emergency Department (HOSPITAL_COMMUNITY)
Admission: EM | Admit: 2015-09-19 | Discharge: 2015-09-19 | Disposition: A | Discharge status: Home / Self Care | Payer: Commercial Managed Care - HMO | Attending: Emergency Medicine | Admitting: Emergency Medicine

## 2015-09-19 ENCOUNTER — Ambulatory Visit (INDEPENDENT_AMBULATORY_CARE_PROVIDER_SITE_OTHER): Payer: Commercial Managed Care - HMO | Admitting: Family Medicine

## 2015-09-19 VITALS — BP 112/70 | HR 97

## 2015-09-19 DIAGNOSIS — E119 Type 2 diabetes mellitus without complications: Secondary | ICD-10-CM | POA: Insufficient documentation

## 2015-09-19 DIAGNOSIS — I509 Heart failure, unspecified: Secondary | ICD-10-CM | POA: Insufficient documentation

## 2015-09-19 DIAGNOSIS — I2510 Atherosclerotic heart disease of native coronary artery without angina pectoris: Secondary | ICD-10-CM | POA: Diagnosis not present

## 2015-09-19 DIAGNOSIS — Z7982 Long term (current) use of aspirin: Secondary | ICD-10-CM | POA: Insufficient documentation

## 2015-09-19 DIAGNOSIS — R42 Dizziness and giddiness: Secondary | ICD-10-CM | POA: Diagnosis not present

## 2015-09-19 DIAGNOSIS — I129 Hypertensive chronic kidney disease with stage 1 through stage 4 chronic kidney disease, or unspecified chronic kidney disease: Secondary | ICD-10-CM | POA: Diagnosis not present

## 2015-09-19 DIAGNOSIS — R0789 Other chest pain: Secondary | ICD-10-CM

## 2015-09-19 DIAGNOSIS — M7989 Other specified soft tissue disorders: Secondary | ICD-10-CM | POA: Insufficient documentation

## 2015-09-19 DIAGNOSIS — K219 Gastro-esophageal reflux disease without esophagitis: Secondary | ICD-10-CM | POA: Insufficient documentation

## 2015-09-19 DIAGNOSIS — G8929 Other chronic pain: Secondary | ICD-10-CM | POA: Diagnosis not present

## 2015-09-19 DIAGNOSIS — Z87891 Personal history of nicotine dependence: Secondary | ICD-10-CM | POA: Insufficient documentation

## 2015-09-19 DIAGNOSIS — Z79899 Other long term (current) drug therapy: Secondary | ICD-10-CM | POA: Diagnosis not present

## 2015-09-19 DIAGNOSIS — I499 Cardiac arrhythmia, unspecified: Secondary | ICD-10-CM | POA: Insufficient documentation

## 2015-09-19 DIAGNOSIS — Z86718 Personal history of other venous thrombosis and embolism: Secondary | ICD-10-CM | POA: Insufficient documentation

## 2015-09-19 DIAGNOSIS — G4730 Sleep apnea, unspecified: Secondary | ICD-10-CM | POA: Diagnosis not present

## 2015-09-19 DIAGNOSIS — Z9981 Dependence on supplemental oxygen: Secondary | ICD-10-CM | POA: Diagnosis not present

## 2015-09-19 DIAGNOSIS — N189 Chronic kidney disease, unspecified: Secondary | ICD-10-CM | POA: Diagnosis not present

## 2015-09-19 DIAGNOSIS — Z8546 Personal history of malignant neoplasm of prostate: Secondary | ICD-10-CM | POA: Diagnosis not present

## 2015-09-19 DIAGNOSIS — E785 Hyperlipidemia, unspecified: Secondary | ICD-10-CM | POA: Insufficient documentation

## 2015-09-19 DIAGNOSIS — Z7901 Long term (current) use of anticoagulants: Secondary | ICD-10-CM | POA: Insufficient documentation

## 2015-09-19 DIAGNOSIS — R079 Chest pain, unspecified: Secondary | ICD-10-CM | POA: Diagnosis not present

## 2015-09-19 DIAGNOSIS — R0602 Shortness of breath: Secondary | ICD-10-CM | POA: Diagnosis not present

## 2015-09-19 DIAGNOSIS — R140 Abdominal distension (gaseous): Secondary | ICD-10-CM | POA: Diagnosis not present

## 2015-09-19 HISTORY — DX: Heart failure, unspecified: I50.9

## 2015-09-19 LAB — BRAIN NATRIURETIC PEPTIDE: B Natriuretic Peptide: 141.1 pg/mL — ABNORMAL HIGH (ref 0.0–100.0)

## 2015-09-19 LAB — BASIC METABOLIC PANEL
ANION GAP: 9 (ref 5–15)
BUN: 13 mg/dL (ref 6–20)
CALCIUM: 8.8 mg/dL — AB (ref 8.9–10.3)
CO2: 29 mmol/L (ref 22–32)
CREATININE: 1.4 mg/dL — AB (ref 0.61–1.24)
Chloride: 101 mmol/L (ref 101–111)
GFR, EST AFRICAN AMERICAN: 52 mL/min — AB (ref >60)
GFR, EST NON AFRICAN AMERICAN: 45 mL/min — AB (ref >60)
Glucose, Bld: 141 mg/dL — ABNORMAL HIGH (ref 65–99)
Potassium: 4.8 mmol/L (ref 3.5–5.1)
SODIUM: 139 mmol/L (ref 135–145)

## 2015-09-19 LAB — CBC WITH DIFFERENTIAL/PLATELET
BASOS ABS: 0 10*3/uL (ref 0.0–0.1)
BASOS PCT: 0 %
EOS ABS: 0.1 10*3/uL (ref 0.0–0.7)
EOS PCT: 1 %
HCT: 36.4 % — ABNORMAL LOW (ref 39.0–52.0)
Hemoglobin: 12.1 g/dL — ABNORMAL LOW (ref 13.0–17.0)
Lymphocytes Relative: 55 %
Lymphs Abs: 3.5 10*3/uL (ref 0.7–4.0)
MCH: 28.3 pg (ref 26.0–34.0)
MCHC: 33.2 g/dL (ref 30.0–36.0)
MCV: 85 fL (ref 78.0–100.0)
MONO ABS: 0.4 10*3/uL (ref 0.1–1.0)
MONOS PCT: 6 %
Neutro Abs: 2.4 10*3/uL (ref 1.7–7.7)
Neutrophils Relative %: 38 %
PLATELETS: 144 10*3/uL — AB (ref 150–400)
RBC: 4.28 MIL/uL (ref 4.22–5.81)
RDW: 14.9 % (ref 11.5–15.5)
WBC: 6.4 10*3/uL (ref 4.0–10.5)

## 2015-09-19 LAB — PROTIME-INR
INR: 2 — AB (ref 0.00–1.49)
PROTHROMBIN TIME: 22.6 s — AB (ref 11.6–15.2)

## 2015-09-19 LAB — I-STAT TROPONIN, ED
TROPONIN I, POC: 0.01 ng/mL (ref 0.00–0.08)
TROPONIN I, POC: 0.01 ng/mL (ref 0.00–0.08)

## 2015-09-19 MED ORDER — IOHEXOL 350 MG/ML SOLN
80.0000 mL | Freq: Once | INTRAVENOUS | Status: AC | PRN
  Administered 2015-09-19: 80 mL via INTRAVENOUS

## 2015-09-19 MED ORDER — FENTANYL 25 MCG/HR TD PT72
50.0000 ug | MEDICATED_PATCH | TRANSDERMAL | Status: DC
  Administered 2015-09-19: 50 ug via TRANSDERMAL
  Filled 2015-09-19: qty 2

## 2015-09-19 NOTE — ED Notes (Signed)
Pt arrives from [facility]CP via [facility]. Pt states he began having right sided CP today that wraps around to his left epigastric region. Pt states this is a new pain for him. Pt was recently admitted to the hospital rt DVTs in lower legs. Pt received 2 nitro SL and 324mg  of ASA PTA.

## 2015-09-19 NOTE — ED Notes (Signed)
Pt is in stable condition upon d/c and is escorted from ED via wheelchair. 

## 2015-09-19 NOTE — ED Notes (Signed)
Patient transported to CT 

## 2015-09-19 NOTE — ED Notes (Deleted)
EMS-patient was in waiting room at [facility] orthopedics when she had episode of diaphoresis and sob, no loc, denies dizziness or feeling like she was going to pass out. On EMS arrival BP 968 systolic. Patient given 400 of NS. 20g(L)chest. CBG 124. EKG unremarkable.

## 2015-09-19 NOTE — Progress Notes (Signed)
   HPI  Patient presents today here with chest pain  Patient and his daughter explained that he was at the pharmacy this morning when he had a sudden onset midline chest pain described as nonradiating tightness. He was given a nitroglycerin tablet at the pharmacy and his pain improved. He was brought here by car for evaluation.  He states that the pain started suddenly and has improved with nitroglycerin. It then began coming on again and he had another nitroglycerin which helped again.  He has been given 4 baby aspirin here.  He states his breathing is improved since arrival. He was previously hospitalized with a DVT and has been bridged to Coumadin, finishing his last heparin injection one day ago.  PMH: Smoking status noted ROS: Per HPI  Objective: BP 112/70 mmHg  Pulse 97  SpO2 86% Gen: NAD, alert, cooperative with exam HEENT: NCAT CV: Slightly tachycardia, no murmur Resp: CTABL, no wheezes, non-labored Abd: Generalized tenderness throughout Ext: Left lower extremity with 2+ pitting edema, tenderness to palpation, right lower extremity with trace pitting edema Neuro: Alert and oriented, No gross deficits  Assessment and plan:  # Chest pain Considering CAD, diabetes, and recent DVT we cannot rule out serious etiology here today area Improvement with nitroglycerin indicates likely cardiac strain EKG today without ischemic findings Transfer to the ER via ambulance   Orders Placed This Encounter  Procedures  . EKG 12-Lead    Laroy Apple, MD [facility] 09/19/2015, 11:28 AM

## 2015-09-19 NOTE — ED Notes (Signed)
CT informed pt has adequate IV access for CTA.

## 2015-09-19 NOTE — ED Notes (Signed)
Requested meds from pharmacy  

## 2015-09-19 NOTE — ED Provider Notes (Signed)
Assumed care of pt from Dr. Maryan Rued.  Briefly, pt is a 81 y.o. male presenting with chest pain.  Pt has a ho recently dx DVT.  On assumption of care PE scan negative.  Initial trop negative.  Awaiting delta trop at 1700.  Delta trop negative.  DC home in stable condition  1. Chest pain      Debby Freiberg, MD 09/20/15 [phone]

## 2015-09-19 NOTE — ED Provider Notes (Signed)
CSN: [id number]     Arrival date & time 09/19/15  1235 History   First MD Initiated Contact with Patient 09/19/15 1254     Chief Complaint  Patient presents with  . Chest Pain     (Consider location/radiation/quality/duration/timing/severity/associated sxs/prior Treatment) HPI [name] is an 81 yo male with HTN, HLD, DMII, CAD, GERD, DVT, and h/o prostate cancer, presenting with one day h/o chest pain.  Pain is right sided, starting this morning while getting dressed, and unrelieved with rest.  He describes the pain as aching, radiating to the abdomen.  He endorses SOB, which is his baseline.  He endorses dizziness and feeling unsteady on his feet, which is also his baseline.  He denies diaphoresis, nausea, or vomiting associated with the pain.    He was diagnosed with LLE DVT last week, and started on Lovenox.  His last Lovenox injection was Sunday. He went to the pharmacist today to pick up his Warfarin prescription after coagulation visit yesterday at PCP.  INR at that time was therapeutic at 2.1.    He has a h/o prostate cancer, s/p hormone therapy.  He reports he has been in remission for 3-4 years.  He regularly follows up with his urologist.  He has a h/o CAD, but denies MI.  Last echo was 2015, with EF 60-65% and grade 1 diastolic dysfunction.  Stress test at that time was negative.  Past Medical History  Diagnosis Date  . DVT of leg (deep venous thrombosis) 1999    RLE   . Hypertension   . Hiatal hernia   . Thyroid nodule   . Goiter     stable x many year - last endo exam 08/2010  . Sleep apnea   . Type 2 diabetes mellitus   . Chronic kidney disease   . Diabetic retinopathy     right eye   . Diabetic neuropathy   . Osteopenia   . CAD (coronary artery disease)     (Left main normal, LAD 30-40% mid stenosis, diagonal 30-40% stenosis, circumflex 30-40% stenosis, right coronary artery 30-40% stenosis. 2003.)  . Sleep apnea     CPAP  . GERD (gastroesophageal reflux  disease)   . Diabetes mellitus   . Hyperlipidemia   . HTN (hypertension)   . Prostate cancer   . Chronic back pain   . DVT (deep venous thrombosis)   . Lumbar spondylosis   . CHF (congestive heart failure)    Past Surgical History  Procedure Laterality Date  . Tonsilectomy, adenoidectomy, bilateral myringotomy and tubes    . Inguinal hernia repair      bilateral  . Knee arthroscopy      left   . Biopsy of right ear    . Cholecystectomy    . Cataract extraction, bilateral    . Tonsillectomy and adenoidectomy    . Appendectomy    . Inguinal hernia repair      Bilateral   Family History  Problem Relation Age of Onset  . Hyperlipidemia Mother   . Hypertension Mother   . Deep vein thrombosis Mother   . Prostate cancer Father   . Colon cancer Sister   . Cancer Father 13  . Pulmonary embolism Mother   . Diabetes Mother    Social History  Substance Use Topics  . Smoking status: Former Smoker    Types: Cigarettes, Pipe, Cigars    Quit date: 11/08/2003  . Smokeless tobacco: Former Systems developer    Quit date: 11/08/2003  .  Alcohol Use: No    Review of Systems  Constitutional: Negative for fever, chills, diaphoresis, appetite change and fatigue.  Respiratory: Positive for shortness of breath. Negative for cough, choking, chest tightness and wheezing.   Cardiovascular: Positive for chest pain and leg swelling. Negative for palpitations.  Gastrointestinal: Positive for abdominal distention. Negative for nausea, vomiting, abdominal pain, diarrhea and constipation.  Endocrine: Negative for polyuria.  Genitourinary: Negative for dysuria and difficulty urinating.  Musculoskeletal: Positive for myalgias, back pain and gait problem.  Skin: Negative for rash.  Neurological: Positive for dizziness and light-headedness. Negative for headaches.      Allergies  Actos; Bactrim; Doxycycline; Flagyl; Gabapentin; Lyrica; Lyrica; Motrin; Nsaids; Nsaids; Ultram; Ultram; Vioxx; Celebrex;  Celebrex; and Morphine and related  Home Medications   Prior to Admission medications   Medication Sig Start Date End Date Taking? Authorizing Provider  aspirin (ASPIRIN CHILDRENS) 81 MG chewable tablet Chew 81 mg by mouth daily.     Yes Historical Provider, MD  Cholecalciferol (VITAMIN D3) 2000 UNITS capsule Take 2,000 Units by mouth 2 (two) times daily.    Yes Historical Provider, MD  citalopram (CELEXA) 20 MG tablet Take 1 tablet (20 mg total) by mouth daily. 08/04/15  Yes Mary-Margaret Hassell Done, FNP  docusate sodium (STOOL SOFTENER) 100 MG capsule Take 300 mg by mouth 2 (two) times daily as needed for mild constipation.    Yes Historical Provider, MD  enoxaparin (LOVENOX) 100 MG/ML injection Inject 1.5 mLs (150 mg total) into the skin daily. 09/13/15  Yes Ripley Fraise, MD  fentaNYL (DURAGESIC) 50 MCG/HR Place 1 patch (50 mcg total) onto the skin every 3 (three) days. 09/13/15  Yes Mary-Margaret Hassell Done, FNP  finasteride (PROSCAR) 5 MG tablet Take 1 tablet (5 mg total) by mouth daily. 08/04/15  Yes Mary-Margaret Hassell Done, FNP  furosemide (LASIX) 40 MG tablet Take 1 tablet (40 mg total) by mouth daily. 08/04/15  Yes Mary-Margaret Hassell Done, FNP  losartan (COZAAR) 50 MG tablet Take 1 tablet (50 mg total) by mouth daily. 08/04/15  Yes Mary-Margaret Hassell Done, FNP  metFORMIN (GLUCOPHAGE-XR) 500 MG 24 hr tablet Take 1 tablet (500 mg total) by mouth daily with breakfast. 08/04/15  Yes Mary-Margaret Hassell Done, FNP  Misc. Devices (HUGO ROLLING WALKER ELITE) MISC 1 each by Does not apply route daily. 08/04/15  Yes Mary-Margaret Hassell Done, FNP  Omega-3 Fatty Acids (FISH OIL) 1200 MG CAPS Take 1 capsule by mouth 5 (five) times daily.   Yes Historical Provider, MD  omeprazole (PRILOSEC) 20 MG capsule Take 1 capsule (20 mg total) by mouth daily. 08/04/15  Yes Mary-Margaret Hassell Done, FNP  rosuvastatin (CRESTOR) 5 MG tablet Take 1 tablet (5 mg total) by mouth daily. 08/04/15  Yes Mary-Margaret Hassell Done, FNP  Specialty Vitamins Products  (MAGNESIUM, AMINO ACID CHELATE,) 133 MG tablet Take 1 tablet by mouth 2 (two) times daily.   Yes Historical Provider, MD  terazosin (HYTRIN) 2 MG capsule Take 1 capsule (2 mg total) by mouth at bedtime. 08/04/15  Yes Mary-Margaret Hassell Done, FNP  vitamin B-12 (CYANOCOBALAMIN) 1000 MCG tablet Take 1,000 mcg by mouth daily.   Yes Historical Provider, MD  warfarin (COUMADIN) 5 MG tablet Take 1 to 2 tablets (= 5 to 10 mg) Patient taking differently: Take 5 mg by mouth. Take 1 to 2 tablets (= 5 to 10 mg) 09/18/15  Yes Tammy Eckard, PHARMD  amLODipine (NORVASC) 5 MG tablet Take 1 tablet (5 mg total) by mouth daily. 08/04/15   Mary-Margaret Hassell Done, FNP  glucose blood (ACCU-CHEK AVIVA PLUS)  test strip Test 2-3 X a day and prn   Dx. E11.9 01/03/15   Mary-Margaret Hassell Done, FNP   BP 143/82 mmHg  Pulse 99  Temp(Src) 98 F (36.7 C) (Oral)  Resp 21  SpO2 98% Physical Exam  Constitutional: He is oriented to person, place, and time. He appears well-developed and well-nourished. No distress.  HENT:  Head: Normocephalic and atraumatic.  Eyes: EOM are normal. No scleral icterus.  Neck: Normal range of motion. No JVD present. No tracheal deviation present.  Cardiovascular: Normal rate, normal heart sounds and intact distal pulses.   Irregular rhythm  Pulmonary/Chest: Effort normal and breath sounds normal. No respiratory distress. He has no wheezes.  Abdominal: Soft. He exhibits distension. There is no rebound and no guarding.  Minimally TTP diffusely  Musculoskeletal: Normal range of motion.  1+ edema to knee bilaterally.  Neurological: He is alert and oriented to person, place, and time.  Skin: Skin is warm and dry. No rash noted. He is not diaphoretic.    ED Course  Procedures (including critical care time) Labs Review Labs Reviewed  CBC WITH DIFFERENTIAL/PLATELET - Abnormal; Notable for the following:    Hemoglobin 12.1 (*)    HCT 36.4 (*)    Platelets 144 (*)    All other components within normal limits   BASIC METABOLIC PANEL - Abnormal; Notable for the following:    Glucose, Bld 141 (*)    Creatinine, Ser 1.40 (*)    Calcium 8.8 (*)    GFR calc non Af Amer 45 (*)    GFR calc Af Amer 52 (*)    All other components within normal limits  PROTIME-INR - Abnormal; Notable for the following:    Prothrombin Time 22.6 (*)    INR 2.00 (*)    All other components within normal limits  BRAIN NATRIURETIC PEPTIDE - Abnormal; Notable for the following:    B Natriuretic Peptide 141.1 (*)    All other components within normal limits  I-STAT TROPOININ, ED    Imaging Review Dg Chest Port 1 View  09/19/2015   CLINICAL DATA:  Elevated D-dimer  EXAM: PORTABLE CHEST - 1 VIEW  COMPARISON:  07/12/2014  FINDINGS: Heart size is enlarged. No pleural effusion or edema identified. No airspace consolidation noted. The visualized osseous structures are unremarkable.  IMPRESSION: 1. No acute cardiopulmonary abnormality.   Electronically Signed   By: Kerby Moors M.D.   On: 09/19/2015 14:37   I have personally reviewed and evaluated these images and lab results as part of my medical decision-making.   EKG Interpretation None      MDM   Final diagnoses:  Chest pain   Chest pain: Patient with newly diagnosed DVT, recently started anticoagulation, and h/o prostate cancer presenting with new onset chest pain c/f PE.  CTA negative.  CXR negative for PNA.  Troponin negative.  BNP minimally elevated, without pleural edema.  Will repeat troponins to r/o ACS.  Patient will need to continue current Warfarin regimen. - Troponin at [place], MD 09/20/15 [place], MD 09/23/15 2147

## 2015-09-19 NOTE — Discharge Instructions (Signed)
Do not resume Metformin until Thursday, 09/21/15.  Chest Pain (Nonspecific) It is often hard to give a specific diagnosis for the cause of chest pain. There is always a chance that your pain could be related to something serious, such as a heart attack or a blood clot in the lungs. You need to follow up with your health care provider for further evaluation. CAUSES   Heartburn.  Pneumonia or bronchitis.  Anxiety or stress.  Inflammation around your heart (pericarditis) or lung (pleuritis or pleurisy).  A blood clot in the lung.  A collapsed lung (pneumothorax). It can develop suddenly on its own (spontaneous pneumothorax) or from trauma to the chest.  Shingles infection (herpes zoster virus). The chest wall is composed of bones, muscles, and cartilage. Any of these can be the source of the pain.  The bones can be bruised by injury.  The muscles or cartilage can be strained by coughing or overwork.  The cartilage can be affected by inflammation and become sore (costochondritis). DIAGNOSIS  Lab tests or other studies may be needed to find the cause of your pain. Your health care provider may have you take a test called an ambulatory electrocardiogram (ECG). An ECG records your heartbeat patterns over a 24-hour period. You may also have other tests, such as:  Transthoracic echocardiogram (TTE). During echocardiography, sound waves are used to evaluate how blood flows through your heart.  Transesophageal echocardiogram (TEE).  Cardiac monitoring. This allows your health care provider to monitor your heart rate and rhythm in real time.  Holter monitor. This is a portable device that records your heartbeat and can help diagnose heart arrhythmias. It allows your health care provider to track your heart activity for several days, if needed.  Stress tests by exercise or by giving medicine that makes the heart beat faster. TREATMENT   Treatment depends on what may be causing your chest  pain. Treatment may include:  Acid blockers for heartburn.  Anti-inflammatory medicine.  Pain medicine for inflammatory conditions.  Antibiotics if an infection is present.  You may be advised to change lifestyle habits. This includes stopping smoking and avoiding alcohol, caffeine, and chocolate.  You may be advised to keep your head raised (elevated) when sleeping. This reduces the chance of acid going backward from your stomach into your esophagus. Most of the time, nonspecific chest pain will improve within 2-3 days with rest and mild pain medicine.  HOME CARE INSTRUCTIONS   If antibiotics were prescribed, take them as directed. Finish them even if you start to feel better.  For the next few days, avoid physical activities that bring on chest pain. Continue physical activities as directed.  Do not use any tobacco products, including cigarettes, chewing tobacco, or electronic cigarettes.  Avoid drinking alcohol.  Only take medicine as directed by your health care provider.  Follow your health care provider's suggestions for further testing if your chest pain does not go away.  Keep any follow-up appointments you made. If you do not go to an appointment, you could develop lasting (chronic) problems with pain. If there is any problem keeping an appointment, call to reschedule. SEEK MEDICAL CARE IF:   Your chest pain does not go away, even after treatment.  You have a rash with blisters on your chest.  You have a fever. SEEK IMMEDIATE MEDICAL CARE IF:   You have increased chest pain or pain that spreads to your arm, neck, jaw, back, or abdomen.  You have shortness of breath.  You have an increasing cough, or you cough up blood.  You have severe back or abdominal pain.  You feel nauseous or vomit.  You have severe weakness.  You faint.  You have chills. This is an emergency. Do not wait to see if the pain will go away. Get medical help at once. Call your local  emergency services (911 in [place] Do not drive yourself to the hospital. MAKE SURE YOU:   Understand these instructions.  Will watch your condition.  Will get help right away if you are not doing well or get worse. Document Released: 09/25/2005 Document Revised: 12/21/2013 Document Reviewed: 07/21/2008 [facility] Patient Information 2015 [place], [place]. This information is not intended to replace advice given to you by your health care provider. Make sure you discuss any questions you have with your health care provider.

## 2015-09-21 ENCOUNTER — Telehealth: Payer: Self-pay | Admitting: *Deleted

## 2015-09-21 ENCOUNTER — Ambulatory Visit (INDEPENDENT_AMBULATORY_CARE_PROVIDER_SITE_OTHER): Payer: Commercial Managed Care - HMO | Admitting: Pharmacist

## 2015-09-21 ENCOUNTER — Other Ambulatory Visit (INDEPENDENT_AMBULATORY_CARE_PROVIDER_SITE_OTHER): Payer: Commercial Managed Care - HMO

## 2015-09-21 DIAGNOSIS — N289 Disorder of kidney and ureter, unspecified: Secondary | ICD-10-CM | POA: Diagnosis not present

## 2015-09-21 DIAGNOSIS — I82402 Acute embolism and thrombosis of unspecified deep veins of left lower extremity: Secondary | ICD-10-CM

## 2015-09-21 LAB — BMP8+EGFR
BUN/Creatinine Ratio: 13 (ref 10–22)
BUN: 17 mg/dL (ref 8–27)
CHLORIDE: 102 mmol/L (ref 96–108)
CO2: 30 mmol/L — ABNORMAL HIGH (ref 18–29)
Calcium: 8.6 mg/dL (ref 8.6–10.2)
Creatinine, Ser: 1.28 mg/dL — ABNORMAL HIGH (ref 0.76–1.27)
GFR, EST AFRICAN AMERICAN: 59 mL/min/{1.73_m2} — AB (ref >59)
GFR, EST NON AFRICAN AMERICAN: 51 mL/min/{1.73_m2} — AB (ref >59)
Glucose: 175 mg/dL — ABNORMAL HIGH (ref 65–99)
POTASSIUM: 5 mmol/L (ref 3.5–5.2)
SODIUM: 141 mmol/L (ref 134–144)

## 2015-09-21 LAB — POCT INR: INR: 2

## 2015-09-21 NOTE — Telephone Encounter (Signed)
Recommend patient increase warfarin dose to 7.5mg  on Thursday and 5mg  all other days. Recheck INR in 1 week  Angie with [facility] notified Patient notified.

## 2015-09-21 NOTE — Progress Notes (Signed)
Pt advised to have stat lab for the hospitial to check kidney's to see if he can start back he metformin

## 2015-09-21 NOTE — Telephone Encounter (Signed)
INR: 2.0

## 2015-09-26 ENCOUNTER — Telehealth: Payer: Self-pay | Admitting: *Deleted

## 2015-09-26 NOTE — Telephone Encounter (Signed)
Spoke with Angie.  Patient stated he did not miss any doses or have medication changes, however RN states that patient is very forgetfull.  I instructed her to have him take 7.5mg  for 3 days in a row and then 5mg  qd until he is rechecked on Monday or Tuesday of next week.

## 2015-09-26 NOTE — Telephone Encounter (Signed)
INR 1.5

## 2015-09-28 NOTE — Telephone Encounter (Signed)
Referral done

## 2015-10-02 ENCOUNTER — Ambulatory Visit (INDEPENDENT_AMBULATORY_CARE_PROVIDER_SITE_OTHER): Payer: Commercial Managed Care - HMO | Admitting: Pharmacist

## 2015-10-02 ENCOUNTER — Telehealth: Payer: Self-pay | Admitting: *Deleted

## 2015-10-02 DIAGNOSIS — I82402 Acute embolism and thrombosis of unspecified deep veins of left lower extremity: Secondary | ICD-10-CM | POA: Diagnosis not present

## 2015-10-02 LAB — POCT INR: INR: 1.3

## 2015-10-02 NOTE — Telephone Encounter (Signed)
Last week patient took 7.5mg  for 3 days and then 5mg  daily.  INR has continued to decrease.  Had long discussion about compliance and patient does not believe that he has missed any warfarin doses but he cannot be completely sure.  He usually gets meds in pill cards from his pharmacy but has not started doing this for warfarin due to frequent changes.   Recommend patient take 2 tablets daily for next 2 days, then 1.5 tablet daily.  Will order recheck of INR Wednesday 10/04/15. Patient and Angie with [facility] aware of changes and when INR recheck is due.

## 2015-10-02 NOTE — Telephone Encounter (Signed)
INR = 1.3

## 2015-10-02 NOTE — Progress Notes (Signed)
INR checked by home health nurse in patient's home.   Billing once per month interupertation fee.  Patient diagnosis - venous thromboembolism / DVT Procedure code if [id number]

## 2015-10-03 ENCOUNTER — Encounter: Payer: Self-pay | Admitting: Nurse Practitioner

## 2015-10-03 ENCOUNTER — Ambulatory Visit (INDEPENDENT_AMBULATORY_CARE_PROVIDER_SITE_OTHER): Payer: Commercial Managed Care - HMO | Admitting: Nurse Practitioner

## 2015-10-03 VITALS — BP 119/73 | HR 92 | Temp 98.2°F | Ht 68.0 in | Wt 237.6 lb

## 2015-10-03 DIAGNOSIS — I82402 Acute embolism and thrombosis of unspecified deep veins of left lower extremity: Secondary | ICD-10-CM | POA: Diagnosis not present

## 2015-10-03 DIAGNOSIS — Z23 Encounter for immunization: Secondary | ICD-10-CM | POA: Diagnosis not present

## 2015-10-03 DIAGNOSIS — Z09 Encounter for follow-up examination after completed treatment for conditions other than malignant neoplasm: Secondary | ICD-10-CM

## 2015-10-03 NOTE — Patient Instructions (Signed)

## 2015-10-03 NOTE — Progress Notes (Signed)
   Subjective:    Patient ID: [name], male    DOB: [birth year], 81 y.o.   MRN: [id number]  HPI Patient here today for hospital follow up-He went to hospital on 09/19/15 with chest pain- his troponin's were negative. They said it was gastric n nature. He feels fine now. He was then seen in office on 10/03/15 with leg pain and a doppler was ordered and he was positive for DVT. His coumadin levels were to thick so adjustments ar4e being made to coumadin and he is to have INR recheck tomorrow.  * he says that his blood pressure fluctuates- he checks it every  Morning when he checks his blood sugar- When explaining what he does it sounds like heart rate is fluctuating not blood pressure.  Review of Systems  Constitutional: Negative.   HENT: Negative.   Respiratory: Negative for shortness of breath.   Cardiovascular: Negative for chest pain and palpitations.  Gastrointestinal: Negative.   Genitourinary: Negative.   Neurological: Negative.   Psychiatric/Behavioral: Negative.   All other systems reviewed and are negative.      Objective:   Physical Exam  Constitutional: He is oriented to person, place, and time. He appears well-developed and well-nourished.  Cardiovascular: Normal rate, regular rhythm and normal heart sounds.   Pulmonary/Chest: Effort normal and breath sounds normal.  Musculoskeletal: He exhibits no edema.  (-) homan sign  Neurological: He is alert and oriented to person, place, and time.  Skin: Skin is warm and dry.  Psychiatric: He has a normal mood and affect. His behavior is normal. Judgment and thought content normal.   BP 119/73 mmHg  Pulse 92  Temp(Src) 98.2 F (36.8 C) (Oral)  Ht 5\' 8"  (1.727 m)  Wt 237 lb 9.6 oz (107.775 kg)  BMI 36.14 kg/m2        Assessment & Plan:   1. Hospital discharge follow-up   2. DVT (deep venous thrombosis), left    Continue all meds Avoid caffeine Keep diary of heart rate RTO in 1 month follow up  [place], FNP

## 2015-10-04 ENCOUNTER — Telehealth: Payer: Self-pay | Admitting: *Deleted

## 2015-10-04 ENCOUNTER — Ambulatory Visit (INDEPENDENT_AMBULATORY_CARE_PROVIDER_SITE_OTHER): Payer: Commercial Managed Care - HMO | Admitting: Pharmacist

## 2015-10-04 DIAGNOSIS — I82409 Acute embolism and thrombosis of unspecified deep veins of unspecified lower extremity: Secondary | ICD-10-CM | POA: Insufficient documentation

## 2015-10-04 LAB — POCT INR: INR: 1.6

## 2015-10-04 NOTE — Telephone Encounter (Signed)
INR improved but still subtherapeutic.  Recommend warfarin 5mg  - take 2 tablets today and tomorrow.  Recheck INR Friday 10/06/15.  Angie with [facility] to recheck Friday.  Patient notified.

## 2015-10-04 NOTE — Telephone Encounter (Signed)
INR-1.6 

## 2015-10-06 ENCOUNTER — Telehealth: Payer: Self-pay | Admitting: *Deleted

## 2015-10-06 ENCOUNTER — Ambulatory Visit (INDEPENDENT_AMBULATORY_CARE_PROVIDER_SITE_OTHER): Payer: Commercial Managed Care - HMO | Admitting: Pharmacist

## 2015-10-06 LAB — POCT INR: INR: 1.9

## 2015-10-06 NOTE — Telephone Encounter (Signed)
INR 1.9  °

## 2015-10-06 NOTE — Telephone Encounter (Signed)
INR improved but still not at goal.   Recommend - Take 2 tablets today - Friday, October 8th.   Then start new warfarin dose of 2 tablets alternating with 1 and 1/2 tablets.   Recheck INR either 10/11 or 10/12 Patient notified and voiced understanding.  Left message with Angie about changes in dose and when to recheck INR.

## 2015-10-06 NOTE — Progress Notes (Signed)
Patient's INR checked by homoe health nurse in his home.  Reported to our office by phone.

## 2015-10-09 ENCOUNTER — Telehealth: Payer: Self-pay | Admitting: *Deleted

## 2015-10-09 ENCOUNTER — Ambulatory Visit (INDEPENDENT_AMBULATORY_CARE_PROVIDER_SITE_OTHER): Payer: Commercial Managed Care - HMO | Admitting: Pharmacist

## 2015-10-09 LAB — POCT INR: INR: 2.1

## 2015-10-09 NOTE — Telephone Encounter (Signed)
INR therapeutic.  Recommend continue current warfarin dose of 5mg  - take 2 tablets alternating with 1.5 tablets.  Recheck INR in 4 days.   Notified Angie of recommendations and when to recheck INR.  Tried to call patient but not answer.

## 2015-10-09 NOTE — Progress Notes (Signed)
No charge for visit or INR. INR checked in patient's home by [facility] nurse and report to our office.

## 2015-10-09 NOTE — Telephone Encounter (Signed)
INR 2.1

## 2015-10-12 ENCOUNTER — Other Ambulatory Visit: Payer: Self-pay | Admitting: Nurse Practitioner

## 2015-10-12 DIAGNOSIS — G8929 Other chronic pain: Secondary | ICD-10-CM

## 2015-10-12 DIAGNOSIS — M549 Dorsalgia, unspecified: Principal | ICD-10-CM

## 2015-10-12 MED ORDER — FENTANYL 50 MCG/HR TD PT72: 50.0000 ug | MEDICATED_PATCH | TRANSDERMAL | Status: DC

## 2015-10-13 ENCOUNTER — Ambulatory Visit (INDEPENDENT_AMBULATORY_CARE_PROVIDER_SITE_OTHER): Payer: Commercial Managed Care - HMO | Admitting: Pharmacist

## 2015-10-13 ENCOUNTER — Telehealth: Payer: Self-pay | Admitting: *Deleted

## 2015-10-13 LAB — POCT INR: INR: 2

## 2015-10-13 NOTE — Telephone Encounter (Signed)
Therapeutic anticoagulation.  Continue to take warfarin 5mg  tablet at same dose - 10mg  (2 tablets) alternating with 7.5mg  (1.5 tablets).  Recheck INR in 1 week.  Otila Kluver with [facility] notified and patient notified.

## 2015-10-13 NOTE — Telephone Encounter (Signed)
Protime 24.2,  INR 2.0  Alternating 10 mg with 7.5 mg daily

## 2015-10-19 ENCOUNTER — Telehealth: Payer: Self-pay | Admitting: *Deleted

## 2015-10-19 ENCOUNTER — Ambulatory Visit (INDEPENDENT_AMBULATORY_CARE_PROVIDER_SITE_OTHER): Payer: Commercial Managed Care - HMO | Admitting: Pharmacist

## 2015-10-19 LAB — POCT INR: INR: 1.7

## 2015-10-19 NOTE — Telephone Encounter (Signed)
[place] is going to package warfarin for him in separate package for this week.

## 2015-10-19 NOTE — Telephone Encounter (Signed)
Subtherapeutic anticoagulation  Recommend Take 2 tablets today and tomorrow. Then increase to 1.5 tablets on mondays, wednesdays and fridays and 2 tablets all other days.   Recheck INR in 5 to 7 days.

## 2015-10-19 NOTE — Telephone Encounter (Signed)
INR 1.7

## 2015-10-19 NOTE — Progress Notes (Signed)
INR checked in patient's home by home health nurse.  NO charge for lab or visit.

## 2015-10-26 ENCOUNTER — Telehealth: Payer: Self-pay | Admitting: Pharmacist

## 2015-10-26 ENCOUNTER — Ambulatory Visit (INDEPENDENT_AMBULATORY_CARE_PROVIDER_SITE_OTHER): Payer: Commercial Managed Care - HMO | Admitting: Pharmacist

## 2015-10-26 LAB — POCT INR: INR: 2.4

## 2015-10-26 NOTE — Progress Notes (Signed)
No charge for lab or visit - INR checked by home health in patient's home.

## 2015-10-26 NOTE — Telephone Encounter (Signed)
INR was 2.4 today [facility].  Instructed patient to continue warfarn 5mg  - take 1 and 1/2 tablets on MWF and 2 tablets all other days.  Recheck INR in 1 week.  Angie aware of recommendations and order for INR given to her.

## 2015-11-02 ENCOUNTER — Telehealth: Payer: Self-pay | Admitting: *Deleted

## 2015-11-02 ENCOUNTER — Ambulatory Visit (INDEPENDENT_AMBULATORY_CARE_PROVIDER_SITE_OTHER): Payer: Commercial Managed Care - HMO | Admitting: Pharmacist

## 2015-11-02 DIAGNOSIS — I82409 Acute embolism and thrombosis of unspecified deep veins of unspecified lower extremity: Secondary | ICD-10-CM

## 2015-11-02 LAB — POCT INR: INR: 2

## 2015-11-02 NOTE — Telephone Encounter (Signed)
Therapeutic anticoagulation Continue current warfarin dose of 7.5mg  MWF and 10 mg all other days.  Recheck INR in 1 week . Patient and Angie with [facility] notified.

## 2015-11-02 NOTE — Telephone Encounter (Signed)
INR: 2.0

## 2015-11-02 NOTE — Progress Notes (Signed)
INR checked in patient's home by home health agency - [facility].  Billing once monthly interpertation fee.  Dx:  DVT - recurrent

## 2015-11-06 ENCOUNTER — Other Ambulatory Visit: Payer: Self-pay | Admitting: Pharmacist

## 2015-11-06 IMAGING — US US EXTREM LOW VENOUS*L*
1 series · 13 of 24 positions shown · non-contrast
Comparison: None.

CLINICAL DATA: Left lower extremity pain and edema



[Series 1: us extrem low venous*left* · 0.07mm/px · 13 of 39 slices shown]
[im 1/39]
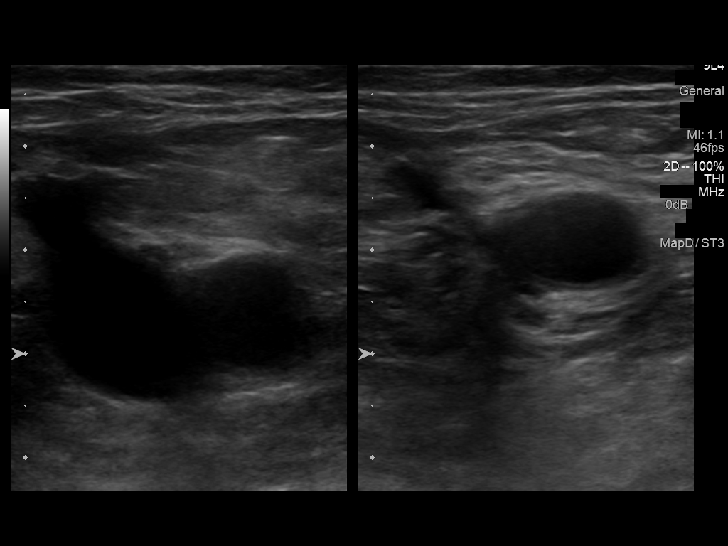
[im 4/39]
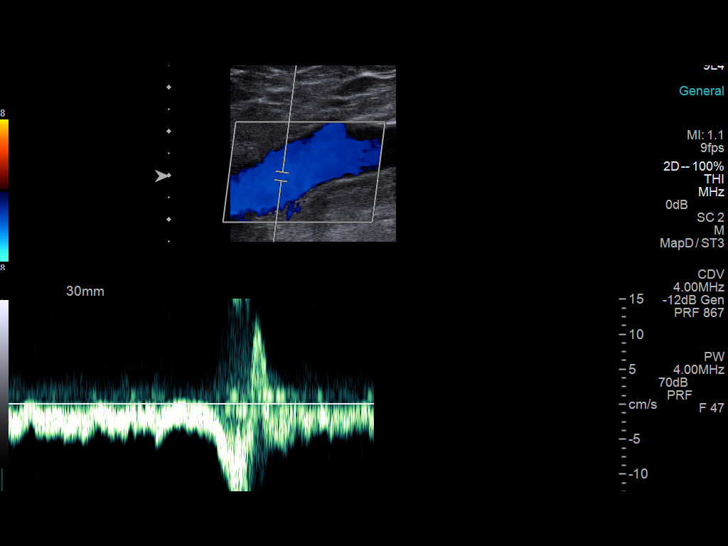
[im 7/39]
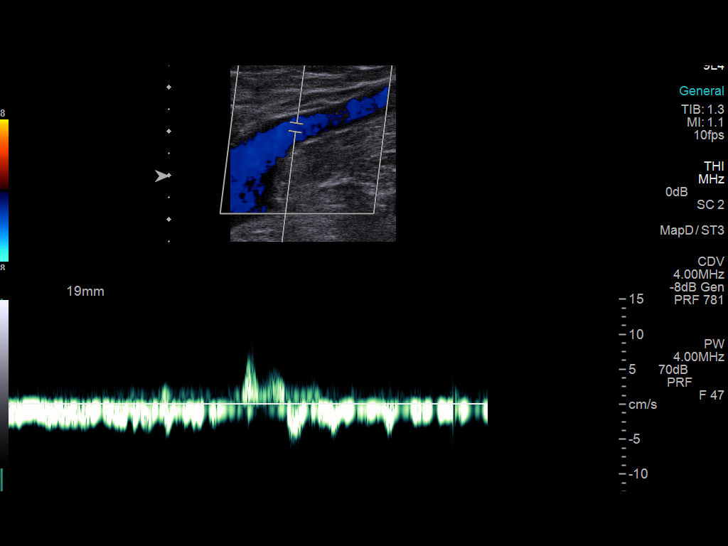
[im 10/39]
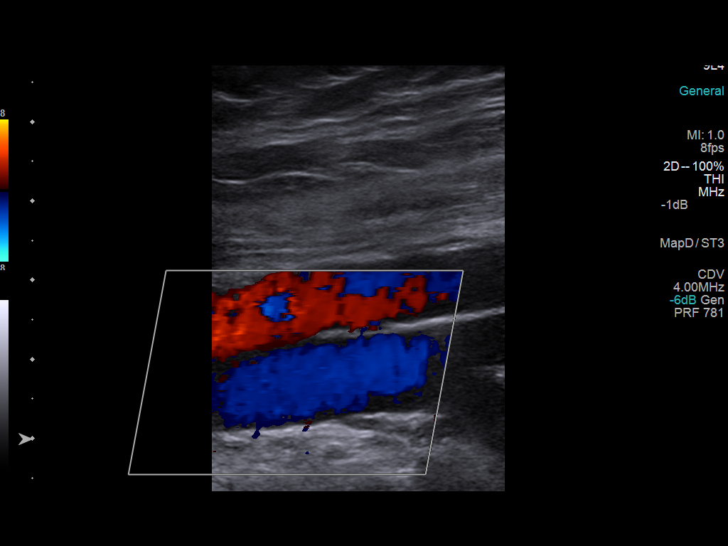
[im 14/39]
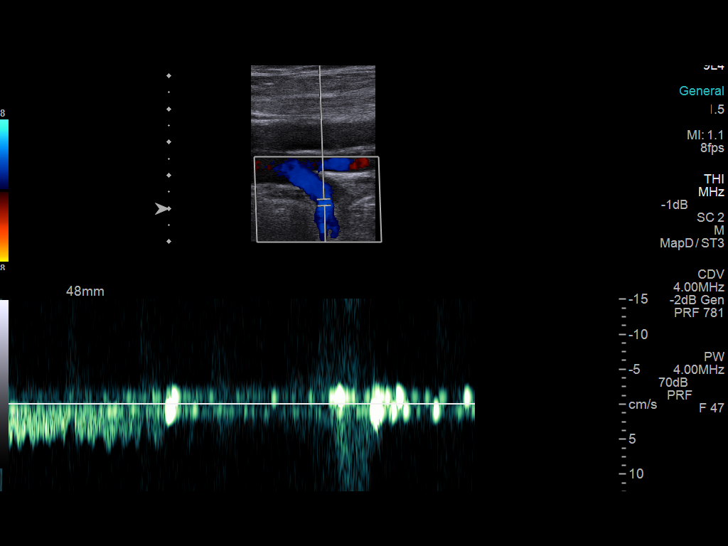
[im 17/39]
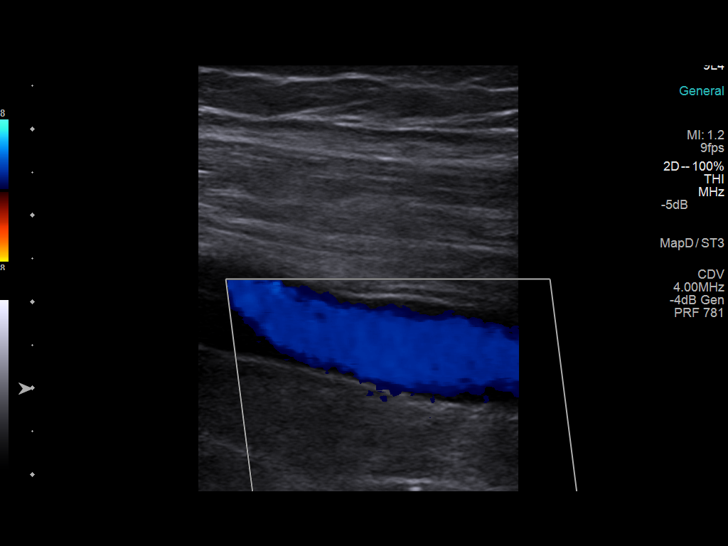
[im 20/39]
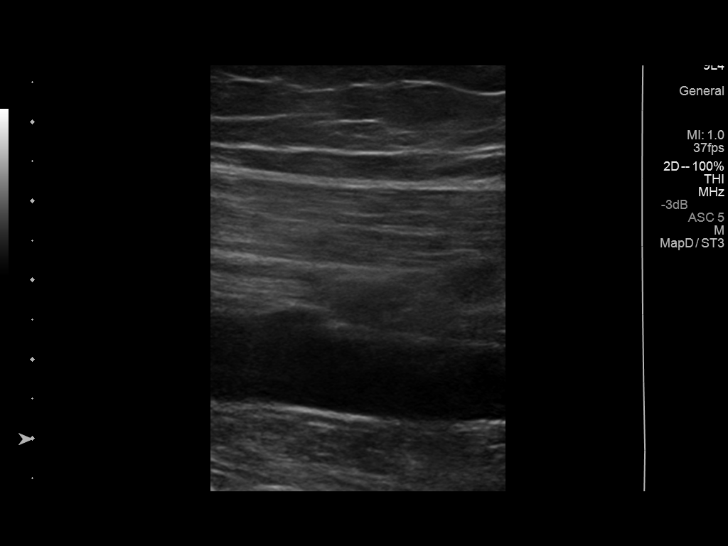
[im 22/39]
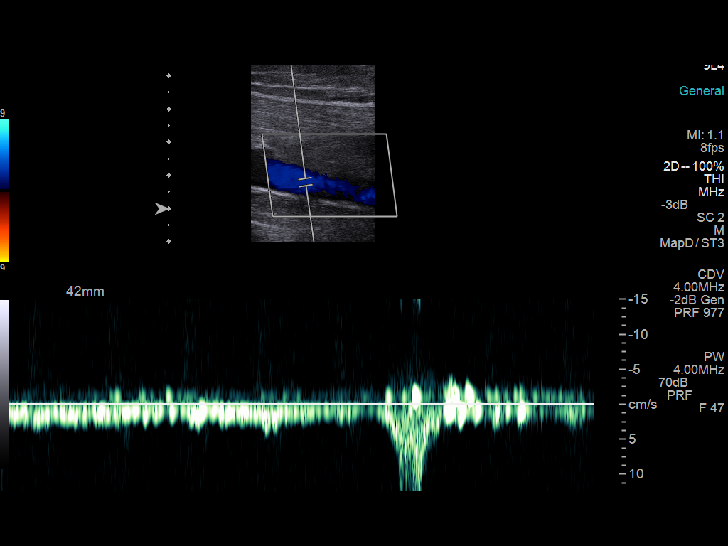
[im 25/39]
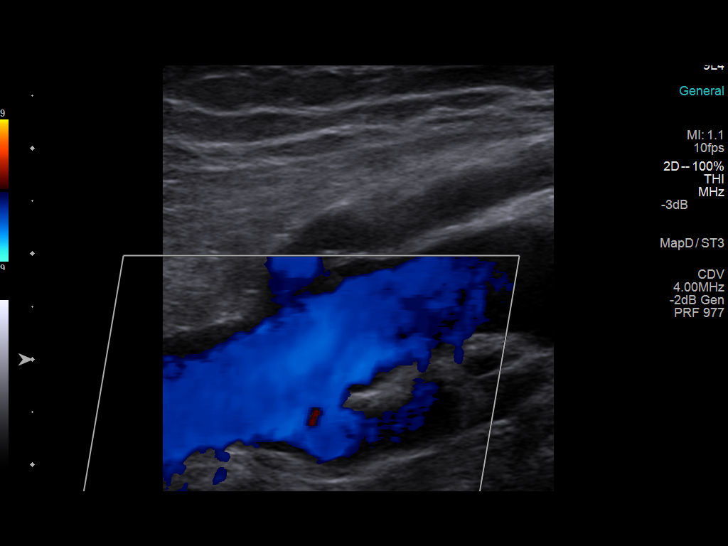
[im 29/39]
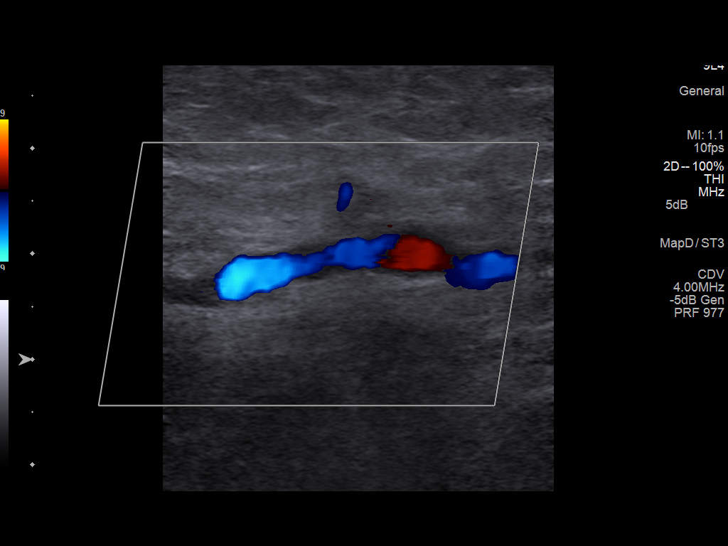
[im 32/39]
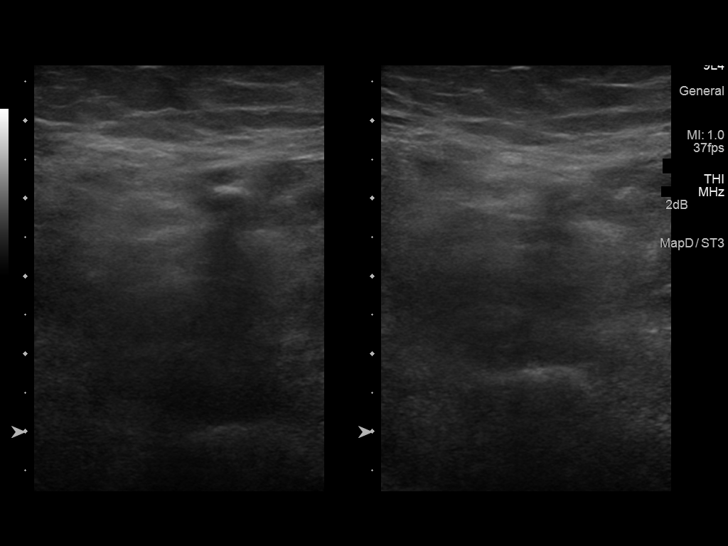
[im 35/39]
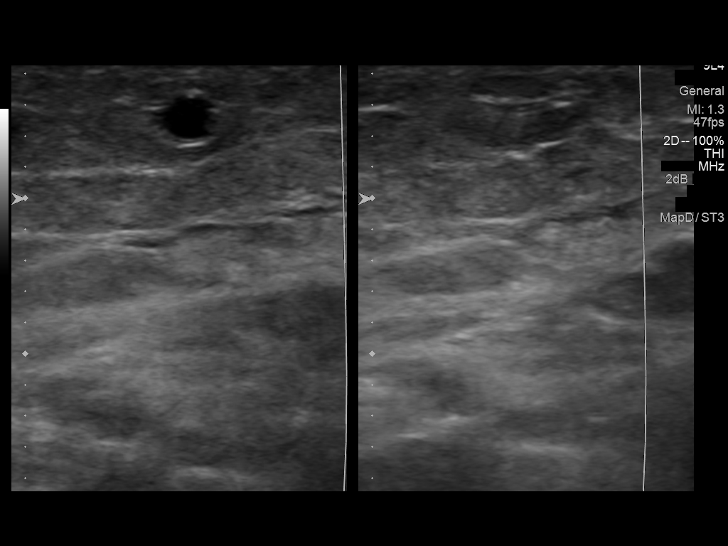
[im 39/39]
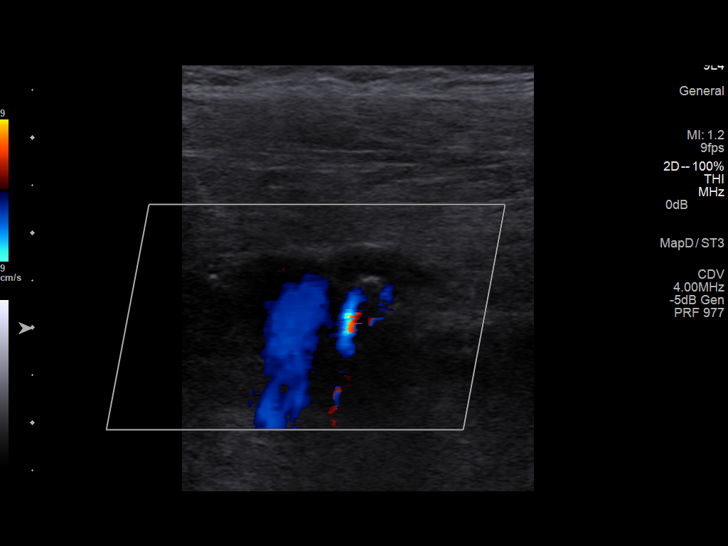

[13 of 24 positions shown; findings below may reference images not displayed]

FINDINGS: Common Femoral Vein: No evidence of thrombus. Normal
compressibility, respiratory phasicity and response to augmentation.

Saphenofemoral Junction: No evidence of thrombus. Normal
compressibility and flow on color Doppler imaging.

Profunda Femoral Vein: No evidence of thrombus. Normal
compressibility and flow on color Doppler imaging.

Femoral Vein: No evidence of thrombus. Normal compressibility,
respiratory phasicity and response to augmentation.

Popliteal Vein: No evidence of thrombus. Normal compressibility,
respiratory phasicity and response to augmentation.

Calf Veins: No evidence of thrombus. Normal compressibility and flow
on color Doppler imaging.

Superficial Great Saphenous Vein: No evidence of thrombus. Normal
compressibility and flow on color Doppler imaging.

Venous Reflux:  None.

Other Findings:  None.
IMPRESSION: No evidence of deep venous thrombosis.

## 2015-11-09 ENCOUNTER — Telehealth: Payer: Self-pay | Admitting: *Deleted

## 2015-11-09 ENCOUNTER — Ambulatory Visit: Payer: Self-pay | Admitting: Pharmacist

## 2015-11-09 LAB — POCT INR: INR: 1.8

## 2015-11-09 NOTE — Telephone Encounter (Signed)
INR 1.8, he thinks he missed one dose.

## 2015-11-09 NOTE — Telephone Encounter (Signed)
Take 2 and 1/2 tablet today, then increase dose to 2 tablets daily except 1 and 1/2 tablet on mondays and fridays.  Recheck INR in 1 week.  Patient notified, pharmacy notified of changes and Angie with [facility] notified.  Also will recheck INR on Wed. 11/16 so that we will get INR results prior to meds being delivered.

## 2015-11-13 ENCOUNTER — Ambulatory Visit: Payer: Medicare HMO | Admitting: Nurse Practitioner

## 2015-11-15 ENCOUNTER — Ambulatory Visit (INDEPENDENT_AMBULATORY_CARE_PROVIDER_SITE_OTHER): Payer: Commercial Managed Care - HMO | Admitting: Pharmacist

## 2015-11-15 ENCOUNTER — Telehealth: Payer: Self-pay | Admitting: *Deleted

## 2015-11-15 ENCOUNTER — Other Ambulatory Visit: Payer: Self-pay | Admitting: Nurse Practitioner

## 2015-11-15 LAB — POCT INR: INR: 2.2

## 2015-11-15 NOTE — Telephone Encounter (Signed)
Therapeutic anticoagulation - continue current warfarin dose of 5mg  - take 1 and 1/2 tablets on mondays and fridays and 2 tablets all other days.  Angie with [facility] notified and she was still at patient's home so she advised patient.  ALso called Rhonda at [facility] with instructions to continue current dose per above.

## 2015-11-15 NOTE — Telephone Encounter (Signed)
Done a week ago, pharmacy does have

## 2015-11-15 NOTE — Telephone Encounter (Signed)
INR 2.2

## 2015-11-15 NOTE — Progress Notes (Signed)
Patient checks INR at home by [facility]  No charge for INR or visit

## 2015-11-16 ENCOUNTER — Other Ambulatory Visit: Payer: Self-pay | Admitting: Nurse Practitioner

## 2015-11-17 NOTE — Telephone Encounter (Signed)
MMM pt, last filled 10/12/15, last seen 10/03/15. Rx will print

## 2015-11-17 NOTE — Telephone Encounter (Signed)
Forwarding to MMM

## 2015-11-18 ENCOUNTER — Other Ambulatory Visit: Payer: Self-pay | Admitting: Nurse Practitioner

## 2015-11-20 ENCOUNTER — Ambulatory Visit (INDEPENDENT_AMBULATORY_CARE_PROVIDER_SITE_OTHER): Payer: Commercial Managed Care - HMO | Admitting: Pharmacist

## 2015-11-20 ENCOUNTER — Telehealth: Payer: Self-pay | Admitting: *Deleted

## 2015-11-20 LAB — POCT INR: INR: 1.9

## 2015-11-20 NOTE — Progress Notes (Signed)
No charge for labs or visit - INR check through home monitoring system  

## 2015-11-20 NOTE — Telephone Encounter (Signed)
rx ready for pickup 

## 2015-11-20 NOTE — Telephone Encounter (Signed)
Protime 22.3  INR 1.9 

## 2015-11-20 NOTE — Telephone Encounter (Signed)
Rx has already been printed and given to pt's daughter.

## 2015-11-20 NOTE — Telephone Encounter (Signed)
Slightly subtherapeutic anticoagulation.   Increase warfarin dose to 2 tablets or 10mg  daily except 7.5mg  on Mondays  Patient, [facility] nurse and patient's pharmacy notified.

## 2015-11-21 ENCOUNTER — Ambulatory Visit: Payer: Commercial Managed Care - HMO | Admitting: Nurse Practitioner

## 2015-11-29 ENCOUNTER — Telehealth: Payer: Self-pay | Admitting: *Deleted

## 2015-11-29 ENCOUNTER — Encounter (INDEPENDENT_AMBULATORY_CARE_PROVIDER_SITE_OTHER): Payer: Commercial Managed Care - HMO | Admitting: Family Medicine

## 2015-11-29 ENCOUNTER — Ambulatory Visit (INDEPENDENT_AMBULATORY_CARE_PROVIDER_SITE_OTHER): Payer: Commercial Managed Care - HMO | Admitting: Pharmacist

## 2015-11-29 DIAGNOSIS — E1142 Type 2 diabetes mellitus with diabetic polyneuropathy: Secondary | ICD-10-CM

## 2015-11-29 DIAGNOSIS — I2510 Atherosclerotic heart disease of native coronary artery without angina pectoris: Secondary | ICD-10-CM

## 2015-11-29 DIAGNOSIS — I10 Essential (primary) hypertension: Secondary | ICD-10-CM | POA: Diagnosis not present

## 2015-11-29 DIAGNOSIS — I82402 Acute embolism and thrombosis of unspecified deep veins of left lower extremity: Secondary | ICD-10-CM

## 2015-11-29 LAB — POCT INR: INR: 2.6

## 2015-11-29 NOTE — Progress Notes (Signed)
Patient checks INR at home by [facility] health [facility] with [facility]  No charge for lab or visit.

## 2015-11-29 NOTE — Telephone Encounter (Signed)
Patient and [facility] notified of warfarin dose - continue same.

## 2015-11-29 NOTE — Telephone Encounter (Signed)
INR 2.6

## 2015-11-29 NOTE — Telephone Encounter (Signed)
Therapeutic anticoagulation.  Continue current warfarin dose of 10mg  daily except 7.5mg  on Mondays.  Recheck INR in 1 week.  Angie with [facility] notified and order given Tried to call patient - no answer and unable to leave message

## 2015-12-04 ENCOUNTER — Ambulatory Visit (INDEPENDENT_AMBULATORY_CARE_PROVIDER_SITE_OTHER): Payer: Commercial Managed Care - HMO | Admitting: Pharmacist

## 2015-12-04 ENCOUNTER — Telehealth: Payer: Self-pay

## 2015-12-04 DIAGNOSIS — I82409 Acute embolism and thrombosis of unspecified deep veins of unspecified lower extremity: Secondary | ICD-10-CM | POA: Diagnosis not present

## 2015-12-04 LAB — POCT INR: INR: 2.9

## 2015-12-04 NOTE — Telephone Encounter (Signed)
[name] with [facility] check INR this am with a reading of 2.9. Please refer to previous note for dosing as nurse did not have computer with her when she called in report. Please advise and call [name] at [phone]

## 2015-12-04 NOTE — Telephone Encounter (Signed)
INR was therapeutic.  Recommend continue current dose of warfarin 5mg  - take 1 and 1/2 tablet on Mondays and 2 tablets all other days.  Patient notified and Angie with [facility] notified.  Ordered recheck protime in 1 week.

## 2015-12-04 NOTE — Progress Notes (Signed)
INR checked in patient's home by home health agency.   Billing once per month interupertation fee.  Patient diagnosis - venous thromboembolism.  Procedure code if [id number]

## 2015-12-06 ENCOUNTER — Ambulatory Visit (INDEPENDENT_AMBULATORY_CARE_PROVIDER_SITE_OTHER): Payer: Commercial Managed Care - HMO | Admitting: Nurse Practitioner

## 2015-12-06 ENCOUNTER — Encounter: Payer: Self-pay | Admitting: Nurse Practitioner

## 2015-12-06 VITALS — BP 136/74 | HR 81 | Temp 97.8°F | Ht 68.0 in | Wt 236.0 lb

## 2015-12-06 DIAGNOSIS — E1142 Type 2 diabetes mellitus with diabetic polyneuropathy: Secondary | ICD-10-CM | POA: Diagnosis not present

## 2015-12-06 DIAGNOSIS — H5789 Other specified disorders of eye and adnexa: Secondary | ICD-10-CM

## 2015-12-06 DIAGNOSIS — E785 Hyperlipidemia, unspecified: Secondary | ICD-10-CM

## 2015-12-06 DIAGNOSIS — I10 Essential (primary) hypertension: Secondary | ICD-10-CM | POA: Diagnosis not present

## 2015-12-06 DIAGNOSIS — Z6833 Body mass index (BMI) 33.0-33.9, adult: Secondary | ICD-10-CM | POA: Diagnosis not present

## 2015-12-06 DIAGNOSIS — N400 Enlarged prostate without lower urinary tract symptoms: Secondary | ICD-10-CM | POA: Diagnosis not present

## 2015-12-06 DIAGNOSIS — R972 Elevated prostate specific antigen [PSA]: Secondary | ICD-10-CM | POA: Diagnosis not present

## 2015-12-06 DIAGNOSIS — F419 Anxiety disorder, unspecified: Secondary | ICD-10-CM | POA: Diagnosis not present

## 2015-12-06 DIAGNOSIS — I25810 Atherosclerosis of coronary artery bypass graft(s) without angina pectoris: Secondary | ICD-10-CM

## 2015-12-06 DIAGNOSIS — H578 Other specified disorders of eye and adnexa: Secondary | ICD-10-CM

## 2015-12-06 DIAGNOSIS — N289 Disorder of kidney and ureter, unspecified: Secondary | ICD-10-CM

## 2015-12-06 DIAGNOSIS — E119 Type 2 diabetes mellitus without complications: Secondary | ICD-10-CM

## 2015-12-06 DIAGNOSIS — K219 Gastro-esophageal reflux disease without esophagitis: Secondary | ICD-10-CM | POA: Diagnosis not present

## 2015-12-06 DIAGNOSIS — R609 Edema, unspecified: Secondary | ICD-10-CM

## 2015-12-06 LAB — POCT GLYCOSYLATED HEMOGLOBIN (HGB A1C): Hemoglobin A1C: 7.3

## 2015-12-06 MED ORDER — METFORMIN HCL ER 500 MG PO TB24: 500.0000 mg | ORAL_TABLET | Freq: Every day | ORAL | Status: DC

## 2015-12-06 MED ORDER — FINASTERIDE 5 MG PO TABS: ORAL_TABLET | ORAL | Status: AC

## 2015-12-06 MED ORDER — OMEPRAZOLE 20 MG PO CPDR: 20.0000 mg | DELAYED_RELEASE_CAPSULE | Freq: Every day | ORAL | Status: AC

## 2015-12-06 MED ORDER — FENTANYL 50 MCG/HR TD PT72: MEDICATED_PATCH | TRANSDERMAL | Status: DC

## 2015-12-06 MED ORDER — CITALOPRAM HYDROBROMIDE 20 MG PO TABS: 20.0000 mg | ORAL_TABLET | Freq: Every day | ORAL | Status: DC

## 2015-12-06 MED ORDER — LOSARTAN POTASSIUM 50 MG PO TABS: 50.0000 mg | ORAL_TABLET | Freq: Every day | ORAL | Status: DC

## 2015-12-06 MED ORDER — FUROSEMIDE 40 MG PO TABS: 40.0000 mg | ORAL_TABLET | Freq: Every day | ORAL | Status: DC

## 2015-12-06 MED ORDER — AMLODIPINE BESYLATE 5 MG PO TABS: ORAL_TABLET | ORAL | Status: DC

## 2015-12-06 MED ORDER — TERAZOSIN HCL 2 MG PO CAPS: 2.0000 mg | ORAL_CAPSULE | Freq: Every day | ORAL | Status: DC

## 2015-12-06 NOTE — Progress Notes (Signed)
Subjective:    Patient ID: [name], male    DOB: 1930-01-12, 81 y.o.   MRN: [id number]  Patient here today for follow up of chronic medical problems.   Hypertension This is a chronic problem. The current episode started more than 1 year ago. The problem is controlled. Associated symptoms include chest pain and shortness of breath (occasional). Pertinent negatives include no headaches or palpitations. Risk factors for coronary artery disease include diabetes mellitus, dyslipidemia, male gender and obesity. Past treatments include calcium channel blockers, diuretics and angiotensin blockers. There are no compliance problems.  Hypertensive end-organ damage includes CAD/MI.  Hyperlipidemia This is a chronic problem. The current episode started more than 1 year ago. The problem is controlled. Recent lipid tests were reviewed and are high. Factors aggravating his hyperlipidemia include thiazides. Associated symptoms include chest pain and shortness of breath (occasional). The current treatment provides moderate improvement of lipids. Compliance problems include adherence to diet and adherence to exercise.  Risk factors for coronary artery disease include diabetes mellitus, dyslipidemia, hypertension and obesity.  Diabetes He presents for his follow-up diabetic visit. He has type 2 diabetes mellitus. No MedicAlert identification noted. His disease course has been stable. Pertinent negatives for hypoglycemia include no headaches. Associated symptoms include chest pain. Pertinent negatives for diabetes include no polydipsia, no polyphagia, no polyuria and no visual change. There are no hypoglycemic complications. Symptoms are stable. There are no diabetic complications. Risk factors for coronary artery disease include diabetes mellitus, dyslipidemia and hypertension. Current diabetic treatment includes oral agent (monotherapy). His weight is stable. When asked about meal planning, he reported none. He  has not had a previous visit with a dietitian. His breakfast blood glucose is taken between 8-9 am. His breakfast blood glucose range is generally 140-180 mg/dl. His overall blood glucose range is 140-180 mg/dl. An ACE inhibitor/angiotensin II receptor blocker is being taken. He does not see a podiatrist.Eye exam is not current.  BPH currrently on hytrin and proscar- no trouble passing his water- sees urologist. depression celexa working well to keep him from worrying all the time. GERD/Hialtal hernia Omeprazole working well to keep symptoms under control B12 anemia injections monthly- no c/o fatigue Chronic back pain and neck pain Fentanyl patch and percocet- patient has been on for awhile - is not a candidate for surgery. DVT And is now on coumadin- doing well- no bleeding ckd We are currently monitoring- has not seen nephrologist. Elevated PSA Was 5.2 at last visit- he says urologist wants it repeated.    Review of Systems  Constitutional: Negative.   HENT: Negative.   Respiratory: Positive for shortness of breath (occasional).   Cardiovascular: Positive for chest pain. Negative for palpitations.  Endocrine: Negative for polydipsia, polyphagia and polyuria.  Genitourinary: Positive for frequency and difficulty urinating.  Skin: Negative.   Neurological: Negative for headaches.  Psychiatric/Behavioral: Negative.   All other systems reviewed and are negative.      Objective:   Physical Exam  Constitutional: He is oriented to person, place, and time. He appears well-developed and well-nourished.  HENT:  Head: Normocephalic.  Right Ear: External ear normal.  Left Ear: External ear normal.  Nose: Nose normal.  Mouth/Throat: Oropharynx is clear and moist.  Eyes: EOM are normal. Pupils are equal, round, and reactive to light.  Left scleral injection- no discharge or blurred vision  Neck: Normal range of motion. Neck supple. No JVD present. No thyromegaly present.    Cardiovascular: Normal rate, regular rhythm, normal heart  sounds and intact distal pulses.  Exam reveals no gallop and no friction rub.   No murmur heard. Pulmonary/Chest: Effort normal and breath sounds normal. No respiratory distress. He has no wheezes. He has no rales. He exhibits no tenderness.  Shallow breathing  Abdominal: Soft. Bowel sounds are normal. He exhibits no mass. There is no tenderness.  Genitourinary:  Prostate exam deferred to urologist  Musculoskeletal: Normal range of motion. He exhibits no edema.  Lymphadenopathy:    He has no cervical adenopathy.  Neurological: He is alert and oriented to person, place, and time. No cranial nerve deficit.  Skin: Skin is warm and dry.  Psychiatric: He has a normal mood and affect. His behavior is normal. Judgment and thought content normal.   BP 136/74 mmHg  Pulse 81  Temp(Src) 97.8 F (36.6 C) (Oral)  Ht '5\' 8"'  (1.727 m)  Wt 236 lb (107.049 kg)  BMI 35.89 kg/m2    Results for orders placed or performed in visit on 12/06/15  POCT glycosylated hemoglobin (Hb A1C)  Result Value Ref Range   Hemoglobin A1C 7.3          Assessment & Plan:   1. Essential hypertension Do not add salt to diet - CMP14+EGFR - losartan (COZAAR) 50 MG tablet; Take 1 tablet (50 mg total) by mouth daily.  Dispense: 90 tablet; Refill: 1 - amLODipine (NORVASC) 5 MG tablet; Take 1 tablet (5 mg total) by mouth daily.  Dispense: 90 tablet; Refill: 1  2. 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 Type 2 diabetes mellitus with diabetic polyneuropathy, without long-term current use of insulin (HCC) Carb caounting encouraged - POCT glycosylated hemoglobin (Hb A1C) - metFORMIN (GLUCOPHAGE-XR) 500 MG 24 hr tablet; Take 1 tablet (500 mg total) by mouth daily with breakfast.  Dispense: 180 tablet; Refill: 1  4. Diabetic polyneuropathy associated with type 2  diabetes mellitus ([place]) Do not go barefooted  5. Kidney disease May do nephrology referral  6. Hyperlipidemia Low fat diet - Lipid panel  7. BMI 33.0-33.9,adult Discussed diet and exercise for person with BMI >25 Will recheck weight in 3-6 months   8. Elevated PSA Will send results to urologist - PSA, total and free  9. Redness of eye, left Do not rub eye- watch for changes- patient says eye has done this in past and resolved on its own  10. Anxiety Stress management - citalopram (CELEXA) 20 MG tablet; Take 1 tablet (20 mg total) by mouth daily.  Dispense: 30 tablet; Refill: 5  11. BPH (benign prostatic hyperplasia) - finasteride (PROSCAR) 5 MG tablet; Take 1 tablet (5 mg total) by mouth daily.  Dispense: 90 tablet; Refill: 1 - terazosin (HYTRIN) 2 MG capsule; Take 1 capsule (2 mg total) by mouth at bedtime.  Dispense: 90 capsule; Refill: 1  12. Gastroesophageal reflux disease without esophagitis Avoid spicy foods Do not eat 2 hours prior to bedtime  - omeprazole (PRILOSEC) 20 MG capsule; Take 1 capsule (20 mg total) by mouth daily.  Dispense: 90 capsule; Refill: 1  13. Peripheral edema Elevate legs when sitting - furosemide (LASIX) 40 MG tablet; Take 1 tablet (40 mg total) by mouth daily.  Dispense: 90 tablet; Refill: 1   Labs pending Health maintenance reviewed Diet and exercise encouraged Continue all meds Follow up  In 3 month   [place], FNP

## 2015-12-07 LAB — CMP14+EGFR
ALBUMIN: 4 g/dL (ref 3.5–4.7)
ALT: 20 IU/L (ref 0–44)
AST: 19 IU/L (ref 0–40)
Albumin/Globulin Ratio: 1.7 (ref 1.1–2.5)
Alkaline Phosphatase: 65 IU/L (ref 39–117)
BUN/Creatinine Ratio: 12 (ref 10–22)
BUN: 18 mg/dL (ref 8–27)
Bilirubin Total: 0.4 mg/dL (ref 0.0–1.2)
CALCIUM: 8.8 mg/dL (ref 8.6–10.2)
CHLORIDE: 97 mmol/L (ref 97–106)
CO2: 28 mmol/L (ref 18–29)
CREATININE: 1.47 mg/dL — AB (ref 0.76–1.27)
GFR calc non Af Amer: 43 mL/min/{1.73_m2} — ABNORMAL LOW (ref >59)
GFR, EST AFRICAN AMERICAN: 50 mL/min/{1.73_m2} — AB (ref >59)
GLUCOSE: 209 mg/dL — AB (ref 65–99)
Globulin, Total: 2.3 g/dL (ref 1.5–4.5)
POTASSIUM: 4.8 mmol/L (ref 3.5–5.2)
Sodium: 141 mmol/L (ref 136–144)
TOTAL PROTEIN: 6.3 g/dL (ref 6.0–8.5)

## 2015-12-07 LAB — LIPID PANEL
Chol/HDL Ratio: 3.5 ratio units (ref 0.0–5.0)
Cholesterol, Total: 163 mg/dL (ref 100–199)
HDL: 47 mg/dL (ref >39)
LDL Calculated: 79 mg/dL (ref 0–99)
TRIGLYCERIDES: 183 mg/dL — AB (ref 0–149)
VLDL CHOLESTEROL CAL: 37 mg/dL (ref 5–40)

## 2015-12-07 LAB — PSA, TOTAL AND FREE
PROSTATE SPECIFIC AG, SERUM: 7.7 ng/mL — AB (ref 0.0–4.0)
PSA, Free Pct: 15.3 %
PSA, Free: 1.18 ng/mL

## 2015-12-11 ENCOUNTER — Ambulatory Visit (INDEPENDENT_AMBULATORY_CARE_PROVIDER_SITE_OTHER): Payer: Commercial Managed Care - HMO | Admitting: Pharmacist

## 2015-12-11 ENCOUNTER — Telehealth: Payer: Self-pay | Admitting: *Deleted

## 2015-12-11 LAB — POCT INR: INR: 2.8

## 2015-12-11 NOTE — Telephone Encounter (Signed)
Therapeutic anticoagulation.  Patient to continue current warfarin dose of 2 tablets or 10mg  daily except 7.5mg  on Mondays.  Recommend recheck INR in 1 to 2 weeks.  Patient notified Left message on VM of Angie with [facility] Patient's pharmacy notified.

## 2015-12-11 NOTE — Telephone Encounter (Signed)
Angie called back and states that today was their last home health visit.  Appt made for patient to have INR checked in office.

## 2015-12-11 NOTE — Telephone Encounter (Signed)
Patient notified and appt made for protime and AWV for 12/27/2015 at 11:45am

## 2015-12-11 NOTE — Telephone Encounter (Signed)
INR 2.8

## 2015-12-27 ENCOUNTER — Encounter: Payer: Self-pay | Admitting: Pharmacist

## 2015-12-27 ENCOUNTER — Ambulatory Visit (INDEPENDENT_AMBULATORY_CARE_PROVIDER_SITE_OTHER): Payer: Commercial Managed Care - HMO | Admitting: Pharmacist

## 2015-12-27 VITALS — BP 130/80 | HR 80 | Ht 67.5 in | Wt 240.0 lb

## 2015-12-27 DIAGNOSIS — Z23 Encounter for immunization: Secondary | ICD-10-CM

## 2015-12-27 DIAGNOSIS — Z0000 Encounter for general adult medical examination without abnormal findings: Secondary | ICD-10-CM

## 2015-12-27 DIAGNOSIS — I82409 Acute embolism and thrombosis of unspecified deep veins of unspecified lower extremity: Secondary | ICD-10-CM | POA: Diagnosis not present

## 2015-12-27 DIAGNOSIS — Z1211 Encounter for screening for malignant neoplasm of colon: Secondary | ICD-10-CM

## 2015-12-27 LAB — POCT INR: INR: 2.5

## 2015-12-27 NOTE — Patient Instructions (Addendum)
[name] , Thank you for taking time to come for your Medicare Wellness Visit. I appreciate your ongoing commitment to your health goals. Please review the following plan we discussed and let me know if I can assist you in the future.   These are the goals we discussed:  To lower you triglycerides (and blood glucose) limit your sweets and other sugar containing foods.   Limit your white bread - (choose whole wheat instead), crackers, pasta and rice - serving size should be 1/2 cup. Increase non starchy vegetables such as carrots, lettuce, cucumbers (peeles) cauliflower, broccoli (ok to have 1 or 2 times per week) peppers and onions, zucchini and squash, tomotos Increase high fiber foods (high fiber cereal, oatmeal, fresh fruits) and lean proteins (fish, chicken and [place])  Remove all throw rug from home to help decrease falls Continue to use cane and walker  If you have a copy - consider bringing in a copy of Living Will and Vancleave  This is a list of the screening recommended for you and due dates:  Health Maintenance  Topic Date Due  . Shingles Vaccine  02/04/2016 - cost today would be $9.33  . Tetanus Vaccine  Done today  . Eye exam for diabetics  12/28/2015  . Hemoglobin A1C  06/05/2016  . Flu Shot  07/30/2016  . Complete foot exam   12/05/2016  . Pneumonia vaccines  Completed  *Topic was postponed. The date shown is not the original due date.     Fall Prevention in the Home  Falls can cause injuries and can affect people from all age groups. There are many simple things that you can do to make your home safe and to help prevent falls. WHAT CAN I DO ON THE OUTSIDE OF MY HOME?  Regularly repair the edges of walkways and driveways and fix any cracks.  Remove high doorway thresholds.  Trim any shrubbery on the main path into your home.  Use bright outdoor lighting.  Clear walkways of debris and clutter, including tools and rocks.  Regularly check  that handrails are securely fastened and in good repair. Both sides of any steps should have handrails.  Install guardrails along the edges of any raised decks or porches.  Have leaves, snow, and ice cleared regularly.  Use sand or salt on walkways during winter months.  In the garage, clean up any spills right away, including grease or oil spills. WHAT CAN I DO IN THE BATHROOM?  Use night lights.  Install grab bars by the toilet and in the tub and shower. Do not use towel bars as grab bars.  Use non-skid mats or decals on the floor of the tub or shower.  If you need to sit down while you are in the shower, use a plastic, non-slip stool.Marland Kitchen  Keep the floor dry. Immediately clean up any water that spills on the floor.  Remove soap buildup in the tub or shower on a regular basis.  Attach bath mats securely with double-sided non-slip rug tape.  Remove throw rugs and other tripping hazards from the floor. WHAT CAN I DO IN THE BEDROOM?  Use night lights.  Make sure that a bedside light is easy to reach.  Do not use oversized bedding that drapes onto the floor.  Have a firm chair that has side arms to use for getting dressed.  Remove throw rugs and other tripping hazards from the floor. WHAT CAN I DO IN THE KITCHEN?  Clean up any spills right away.  Avoid walking on wet floors.  Place frequently used items in easy-to-reach places.  If you need to reach for something above you, use a sturdy step stool that has a grab bar.  Keep electrical cables out of the way.  Do not use floor polish or wax that makes floors slippery. If you have to use wax, make sure that it is non-skid floor wax.  Remove throw rugs and other tripping hazards from the floor. WHAT CAN I DO IN THE STAIRWAYS?  Do not leave any items on the stairs.  Make sure that there are handrails on both sides of the stairs. Fix handrails that are broken or loose. Make sure that handrails are as long as the  stairways.  Check any carpeting to make sure that it is firmly attached to the stairs. Fix any carpet that is loose or worn.  Avoid having throw rugs at the top or bottom of stairways, or secure the rugs with carpet tape to prevent them from moving.  Make sure that you have a light switch at the top of the stairs and the bottom of the stairs. If you do not have them, have them installed. WHAT ARE SOME OTHER FALL PREVENTION TIPS?  Wear closed-toe shoes that fit well and support your feet. Wear shoes that have rubber soles or low heels.  When you use a stepladder, make sure that it is completely opened and that the sides are firmly locked. Have someone hold the ladder while you are using it. Do not climb a closed stepladder.  Add color or contrast paint or tape to grab bars and handrails in your home. Place contrasting color strips on the first and last steps.  Use mobility aids as needed, such as canes, walkers, scooters, and crutches.  Turn on lights if it is dark. Replace any light bulbs that burn out.  Set up furniture so that there are clear paths. Keep the furniture in the same spot.  Fix any uneven floor surfaces.  Choose a carpet design that does not hide the edge of steps of a stairway.  Be aware of any and all pets.  Review your medicines with your healthcare provider. Some medicines can cause dizziness or changes in blood pressure, which increase your risk of falling. Talk with your health care provider about other ways that you can decrease your risk of falls. This may include working with a physical therapist or trainer to improve your strength, balance, and endurance.   This information is not intended to replace advice given to you by your health care provider. Make sure you discuss any questions you have with your health care provider.   Document Released: 12/06/2002 Document Revised: 05/02/2015 Document Reviewed: 01/20/2015 Elsevier Interactive Patient Education [employer]

## 2015-12-27 NOTE — Progress Notes (Signed)
Patient ID: [name], male   DOB: 03-15-1930, 81 y.o.   MRN: [id number]    Subjective:   [name] is a 81 y.o. male who presents for an Initial Medicare Annual Wellness Visit and to recheck INR today.   He is widowed and lives in an apartment alone.  He has two daughter who check on him regularly.  [name] c/o of not feeling well for the last several years.  He states that he hurts all over but that duragesic patches help some.   Review of Systems  Review of Systems  Constitutional: Positive for malaise/fatigue. Negative for fever, chills, weight loss and diaphoresis.  HENT: Positive for hearing loss. Negative for congestion, ear discharge, ear pain, nosebleeds, sore throat and tinnitus.   Eyes: Negative.   Respiratory: Negative for cough, hemoptysis, sputum production, shortness of breath, wheezing and stridor.   Cardiovascular: Positive for orthopnea, claudication and leg swelling (1+ edema bilaterally). Negative for chest pain, palpitations and PND.  Gastrointestinal: Positive for constipation (likely related to opoid ). Negative for heartburn, nausea, vomiting, abdominal pain, diarrhea, blood in stool and melena.  Genitourinary: Negative.   Musculoskeletal: Positive for back pain, joint pain and falls. Negative for myalgias and neck pain.  Skin: Positive for itching (related to duragesic patches). Negative for rash.  Neurological: Positive for weakness. Negative for headaches.  Endo/Heme/Allergies: Negative for environmental allergies and polydipsia. Bruises/bleeds easily (patient taking warfarin).  Psychiatric/Behavioral: Positive for memory loss. Negative for suicidal ideas, hallucinations and substance abuse. The patient is not nervous/anxious.      Current Medications (verified) Outpatient Encounter Prescriptions as of 12/27/2015  Medication Sig  . amLODipine (NORVASC) 5 MG tablet Take 1 tablet (5 mg total) by mouth daily.  Marland Kitchen aspirin (ASPIRIN CHILDRENS) 81 MG  chewable tablet Chew 81 mg by mouth daily.    . Cholecalciferol (VITAMIN D3) 2000 UNITS capsule Take 2,000 Units by mouth 2 (two) times daily.   . citalopram (CELEXA) 20 MG tablet Take 1 tablet (20 mg total) by mouth daily.  Marland Kitchen docusate sodium (STOOL SOFTENER) 100 MG capsule Take 300 mg by mouth 2 (two) times daily as needed for mild constipation.   . fentaNYL (DURAGESIC - DOSED MCG/HR) 50 MCG/HR PLACE 1 PATCH ONTO THE SKIN EVERY 3 DAYS  . finasteride (PROSCAR) 5 MG tablet Take 1 tablet (5 mg total) by mouth daily.  . furosemide (LASIX) 40 MG tablet Take 1 tablet (40 mg total) by mouth daily.  Marland Kitchen glucose blood (ACCU-CHEK AVIVA PLUS) test strip Test 2-3 X a day and prn   Dx. E11.9  . losartan (COZAAR) 50 MG tablet Take 1 tablet (50 mg total) by mouth daily.  . metFORMIN (GLUCOPHAGE-XR) 500 MG 24 hr tablet Take 1 tablet (500 mg total) by mouth daily with breakfast.  . Misc. Devices (HUGO ROLLING WALKER ELITE) MISC 1 each by Does not apply route daily.  . nitroGLYCERIN (NITROSTAT) 0.4 MG SL tablet   . Omega-3 Fatty Acids (FISH OIL) 1200 MG CAPS Take 1 capsule by mouth 5 (five) times daily.  Marland Kitchen omeprazole (PRILOSEC) 20 MG capsule Take 1 capsule (20 mg total) by mouth daily.  . rosuvastatin (CRESTOR) 5 MG tablet Take 1 tablet (5 mg total) by mouth daily.  Marland Kitchen Specialty Vitamins Products (MAGNESIUM, AMINO ACID CHELATE,) 133 MG tablet Take 1 tablet by mouth 2 (two) times daily.  Marland Kitchen terazosin (HYTRIN) 2 MG capsule Take 1 capsule (2 mg total) by mouth at bedtime.  . vitamin B-12 (  CYANOCOBALAMIN) 1000 MCG tablet Take 1,000 mcg by mouth daily.  Marland Kitchen warfarin (COUMADIN) 5 MG tablet TAKE 1 OR 2 TABLETS DAILY AS DIRECTED   No facility-administered encounter medications on file as of 12/27/2015.    Allergies (verified) Actos; Bactrim; Doxycycline; Flagyl; Gabapentin; Lyrica; Motrin; Nsaids; Ultram; Vioxx; Celebrex; Celebrex; and Morphine and related   History: Past Medical History  Diagnosis Date  . DVT of leg  (deep venous thrombosis) ([place]) 1999    RLE   . Hypertension   . Hiatal hernia   . Thyroid nodule   . Goiter     stable x many year - last endo exam 08/2010  . Sleep apnea   . Type 2 diabetes mellitus ([place])   . Chronic kidney disease   . Diabetic retinopathy     right eye   . Diabetic neuropathy ([place])   . Osteopenia   . CAD (coronary artery disease)     (Left main normal, LAD 30-40% mid stenosis, diagonal 30-40% stenosis, circumflex 30-40% stenosis, right coronary artery 30-40% stenosis. 2003.)  . Sleep apnea     CPAP  . GERD (gastroesophageal reflux disease)   . Diabetes mellitus   . Hyperlipidemia   . HTN (hypertension)   . Prostate cancer ([place])   . Chronic back pain   . DVT (deep venous thrombosis) ([place])   . Lumbar spondylosis   . CHF (congestive heart failure) [facility])    Past Surgical History  Procedure Laterality Date  . Tonsilectomy, adenoidectomy, bilateral myringotomy and tubes    . Inguinal hernia repair      bilateral  . Knee arthroscopy      left   . Biopsy of right ear    . Cholecystectomy    . Cataract extraction, bilateral    . Tonsillectomy and adenoidectomy    . Appendectomy    . Inguinal hernia repair      Bilateral   Family History  Problem Relation Age of Onset  . Hyperlipidemia Mother   . Hypertension Mother   . Deep vein thrombosis Mother   . Pulmonary embolism Mother   . Diabetes Mother   . Prostate cancer Father   . Cancer Father 24  . Colon cancer Sister   . Depression Sister   . Dementia Sister   . Dementia Sister   . Scleroderma Brother    Social History   Occupational History  . Retired    Social History Main Topics  . Smoking status: Former Smoker    Types: Cigarettes, Pipe, Cigars    Quit date: 11/08/2003  . Smokeless tobacco: Former Systems developer    Quit date: 11/08/2003  . Alcohol Use: No  . Drug Use: No  . Sexual Activity: No    Do you feel safe at home?  Yes  Dietary issues and exercise activities discussed: Current  Exercise Habits:: Exercise is limited by, Limited by:: orthopedic condition(s)  Current Dietary habits:  Not following specific dietary recommendations currently   Cardiac Risk Factors include: advanced age (>45men, >65 women);diabetes mellitus;dyslipidemia;hypertension;male gender;obesity (BMI >30kg/m2);sedentary lifestyle  Objective:    Today's Vitals   12/27/15 1235  BP: 130/80  Pulse: 80  Height: 5' 7.5" (1.715 m)  Weight: 240 lb (108.863 kg)  PainSc: 6   PainLoc: Hip   Body mass index is 37.01 kg/(m^2).   INR was 2.5 today   Activities of Daily Living In your present state of health, do you have any difficulty performing the following activities: 12/27/2015  Hearing? Darreld Mclean  Vision? N  Difficulty concentrating or making decisions? Y  Walking or climbing stairs? Y  Dressing or bathing? N  Doing errands, shopping? Y  Preparing Food and eating ? N  Using the Toilet? N  In the past six months, have you accidently leaked urine? N  Do you have problems with loss of bowel control? N  Managing your Medications? Y  Managing your Finances? N  Housekeeping or managing your Housekeeping? Y    Are there smokers in your home (other than you)? No    Depression Screen PHQ 2/9 Scores 12/27/2015 10/03/2015 08/04/2015 05/01/2015  PHQ - 2 Score 2 2 0 0  PHQ- 9 Score 4 - - -    Fall Risk Fall Risk  12/27/2015 10/03/2015 08/04/2015 05/01/2015 01/30/2015  Falls in the past year? Yes No No No Yes  Number falls in past yr: 2 or more - - - 1  Injury with Fall? No - - - Yes  Risk Factor Category  High Fall Risk - - - High Fall Risk  Risk for fall due to : History of fall(s);Impaired balance/gait;Medication side effect;Impaired mobility - - - -  Follow up Falls prevention discussed - - - -    Cognitive Function: No flowsheet data found.  Immunizations and Health Maintenance Immunization History  Administered Date(s) Administered  . Influenza Whole 10/30/2009  . Influenza,inj,Quad PF,36+ Mos  10/28/2014, 10/03/2015  . Influenza-Unspecified 09/29/2013  . Pneumococcal Conjugate-13 05/01/2015  . Pneumococcal Polysaccharide-23 02/28/1999   There are no preventive care reminders to display for this patient.  Patient Care Team: [name], FNP as PCP - General (Nurse Practitioner) Fanny Skates, MD as Attending Physician (General Surgery) Minus Breeding, MD as Attending Physician (Cardiology) Sherlynn Stalls, MD as Attending Physician (Ophthalmology)  Indicate any recent Medical Services you may have received from other than [facility] providers in the past year (date may be approximate).    Assessment:    Annual Wellness Visit  Therapeutic anticoagulation Chronic Pain with constipation (mostly relieved with stool softner) Elevated triglycerides High fall risk  Depression - on treatment   Screening Tests Health Maintenance  Topic Date Due  . ZOSTAVAX  02/04/2016 (Originally 08/25/1992)  . TETANUS/TDAP  06/05/2016 (Originally 08/26/1949)  . OPHTHALMOLOGY EXAM  12/28/2015  . HEMOGLOBIN A1C  06/05/2016  . INFLUENZA VACCINE  07/30/2016  . FOOT EXAM  12/05/2016  . PNA vac Low Risk Adult  Completed        Plan:   During the course of the visit [name] was educated and counseled about the following appropriate screening and preventive services:   Reviewed vaccination records - offered Zostavax but patient declines b/c he doesn't want;  He did received Boostrix in office today  Colorectal cancer screening - colonoscopy is UTD - due to have FOBT checked.  GIven in office today  Quintana and Echo is UTD  Diabetes - last A1c was 7.3%;  Foot exam and eye exam UTD  Glaucoma screening / Diabetic Eye Exam - UTD  Nutrition counseling - discussed limiting high CHO foods and sweets;  Increase lean meats, non starchy vegetables and high fiber foods  Patient given Senokot-S plan for treating constipation as needed  Fall prevention discussed   Prostate cancer screening -  UTD  Advanced Directives - requested copy  Continue to use cane and walker.  Discussed coming earlier to follow up with PCP -  Patient wants to wait until March as scheduled.  He is encouraged to call if he wants to  be seen sooner.   Anticoagulation Dose Instructions as of 12/27/2015      [name] Tue Wed Thu Fri Sat   New Dose 10 mg 7.5 mg 10 mg 10 mg 10 mg 10 mg 10 mg    Description        Continue current warfarin dose of 2 tablets or 10mg  daily except 7.5mg  on Mondays      Recheck protime in 1 month  Patient Instructions (the written plan) were given to the patient.   [name], [facility]   12/27/2015

## 2016-01-12 ENCOUNTER — Other Ambulatory Visit: Payer: Self-pay | Admitting: Nurse Practitioner

## 2016-01-12 MED ORDER — FENTANYL 50 MCG/HR TD PT72: MEDICATED_PATCH | TRANSDERMAL | Status: DC

## 2016-01-12 NOTE — Telephone Encounter (Signed)
rx ready for pick up But cannot fill till 01/18/16

## 2016-01-12 NOTE — Telephone Encounter (Signed)
Pt aware written Rx is at front desk ready for pickup  

## 2016-01-22 ENCOUNTER — Other Ambulatory Visit: Payer: Self-pay | Admitting: Nurse Practitioner

## 2016-01-29 ENCOUNTER — Ambulatory Visit (INDEPENDENT_AMBULATORY_CARE_PROVIDER_SITE_OTHER): Payer: Commercial Managed Care - HMO | Admitting: Pharmacist

## 2016-01-29 DIAGNOSIS — I82409 Acute embolism and thrombosis of unspecified deep veins of unspecified lower extremity: Secondary | ICD-10-CM | POA: Diagnosis not present

## 2016-01-29 DIAGNOSIS — F419 Anxiety disorder, unspecified: Secondary | ICD-10-CM

## 2016-01-29 DIAGNOSIS — Z7901 Long term (current) use of anticoagulants: Secondary | ICD-10-CM

## 2016-01-29 LAB — POCT INR: INR: 3.6

## 2016-01-29 MED ORDER — CITALOPRAM HYDROBROMIDE 20 MG PO TABS: 10.0000 mg | ORAL_TABLET | Freq: Every day | ORAL | Status: DC

## 2016-01-29 NOTE — Patient Instructions (Addendum)
Anticoagulation Dose Instructions as of 01/29/2016      [name] Tue Wed Thu Fri Sat   New Dose 10 mg 7.5 mg 10 mg 10 mg 10 mg 7.5 mg 10 mg    Description        No warfarin today - Monday, January 30th.  Then decrease warfarin dose to 2 tablets or 10mg  daily except 7.5mg  on Mondays and Fridays.  [place] is going to start to package warfarin in the morning with other medication.       INR was 3.6 today

## 2016-01-29 NOTE — Progress Notes (Signed)
Subjective:     Indication: DVT Bleeding signs/symptoms: None Thromboembolic signs/symptoms: None  Missed Coumadin doses: None Medication changes: no Dietary changes: no Bacterial/viral infection: no Other concerns: yes - patient states that he does not feel that he need celexa any more.  Would like to stop.  Last month's depression screening score was 4.   The following portions of the patient's history were reviewed and updated as appropriate: allergies, current medications, past family history, past medical history and problem list.   Objective:    INR Today: 3.6 Current dose: warfarin 5mg  tablets - take 1 and 1/2 (= 7.5mg )on mondays and 2 (=10mg ) tablets all other days.  Assessment:    Supratherapeutic INR for goal of 2-3   Plan:    1. New dose: no warfarin today - then decrease dose to 7.5mg  on mondays and fridays and 10mg  all other days.   2. Next INR: 2 weeks   3.  Decreased citalopram to 20mg  1/2 tablet daily.  Patient to monitor for increase depression / anxiety.  If he continues feel that depression and anxiety are controlled then may try to discontinue in future.   Cherre Robins, PharmD, CPP  .

## 2016-01-30 ENCOUNTER — Ambulatory Visit (INDEPENDENT_AMBULATORY_CARE_PROVIDER_SITE_OTHER): Payer: Commercial Managed Care - HMO | Admitting: Family Medicine

## 2016-01-30 ENCOUNTER — Ambulatory Visit (INDEPENDENT_AMBULATORY_CARE_PROVIDER_SITE_OTHER): Payer: Commercial Managed Care - HMO

## 2016-01-30 ENCOUNTER — Encounter: Payer: Self-pay | Admitting: Family Medicine

## 2016-01-30 VITALS — BP 135/73 | HR 92 | Temp 97.0°F | Ht 67.5 in | Wt 237.6 lb

## 2016-01-30 DIAGNOSIS — M25572 Pain in left ankle and joints of left foot: Secondary | ICD-10-CM

## 2016-01-30 MED ORDER — OXYCODONE-ACETAMINOPHEN 5-325 MG PO TABS: 1.0000 | ORAL_TABLET | Freq: Three times a day (TID) | ORAL | Status: DC | PRN

## 2016-01-30 NOTE — Progress Notes (Signed)
   HPI  Patient presents today with left foot pain and bruising.  Patient explains that he's had foot pain and bruising on his left foot for about 4 days.  He also has persistent diabetic neuropathy in bilateral feet. He is on a fentanyl patch for chronic back pain, he also has prostate cancer.  Not remember any injury of his foot, however he states that he is very restless in bed and could've easily accidentally kicked something.  He denies any fever, chills, sweats, or sources of bleeding. His INR was 3.6 one day ago  PMH: Smoking status noted ROS: Per HPI  Objective: BP 135/73 mmHg  Pulse 92  Temp(Src) 97 F (36.1 C) (Oral)  Ht 5' 7.5" (1.715 m)  Wt 237 lb 9.6 oz (107.775 kg)  BMI 36.64 kg/m2 Gen: NAD, alert, cooperative with exam HEENT: NCAT CV: RRR, good S1/S2, no murmur Resp: CTABL, no wheezes, non-labored Abd: SNTND, BS present, no guarding or organomegaly Ext: 1-2+ pitting edema bilaterally lower extremities Neuro: Alert and oriented, No gross deficits HEENT: Several seborrheic keratoses scattered throughout bilateral arms and upper chest that is visible Sensation intact to monofilament throughout bilateral plantar feet  Musculoskeletal: Left foot with bruising along the distal metatarsals and proximal toes on his second through fifth digits, he has tenderness to palpation on the second third and fourth metatarsals, he also has pain with flexion of his second and third toes.   DG left foot No acute findings Atherosclerosis.  Assessment and plan:  # Foot pain and bruising Likely all due to hematoma, bruising easily due to supratherapeutic INR. Discussed warm compresses, elevation, and pain medications. I discussed giving him a small amount of oxycodone with his PCP, I discussed with him using it with great caution letting him understand that it could increase risk of fall, sedation, confusion Follow-up as needed, he has a follow-up scheduled in early March,  about 6 weeks away  Radiology with some concern for mineral line vs fracture of great toe- no tenderness of great toe and bruising is limited to 2-5th toes.  I Favor mineral line, clinically he does not have a great toe fracture   Orders Placed This Encounter  Procedures  . DG Foot Complete Left    Standing Status: Future     Number of Occurrences: 1     Standing Expiration Date: 03/29/2017    Order Specific Question:  Reason for Exam (SYMPTOM  OR DIAGNOSIS REQUIRED)    Answer:  bruising and pain, r/o fx    Order Specific Question:  Preferred imaging location?    Answer:  Internal    Meds ordered this encounter  Medications  . oxyCODONE-acetaminophen (ROXICET) 5-325 MG tablet    Sig: Take 1 tablet by mouth every 8 (eight) hours as needed for severe pain.    Dispense:  20 tablet    Refill:  0    Laroy Apple, MD [facility] 01/30/2016, 3:55 PM

## 2016-01-30 NOTE — Patient Instructions (Signed)
It looks like your pain is due to a large bruise  Use warm compresses or ice for 15-20 minutes 3-4 times daily and elevate the foot whenever routinely.   I have given you a small amount of oxycodone, Please use it very cautiously as it can cause sleepiness and make it easier to fall.   Hematoma A hematoma is a collection of blood under the skin, in an organ, in a body space, in a joint space, or in other tissue. The blood can clot to form a lump that you can see and feel. The lump is often firm and may sometimes become sore and tender. Most hematomas get better in a few days to weeks. However, some hematomas may be serious and require medical care. Hematomas can range in size from very small to very large. CAUSES  A hematoma can be caused by a blunt or penetrating injury. It can also be caused by spontaneous leakage from a blood vessel under the skin. Spontaneous leakage from a blood vessel is more likely to occur in older people, especially those taking blood thinners. Sometimes, a hematoma can develop after certain medical procedures. SIGNS AND SYMPTOMS   A firm lump on the body.  Possible pain and tenderness in the area.  Bruising.Blue, dark blue, purple-red, or yellowish skin may appear at the site of the hematoma if the hematoma is close to the surface of the skin. For hematomas in deeper tissues or body spaces, the signs and symptoms may be subtle. For example, an intra-abdominal hematoma may cause abdominal pain, weakness, fainting, and shortness of breath. An intracranial hematoma may cause a headache or symptoms such as weakness, trouble speaking, or a change in consciousness. DIAGNOSIS  A hematoma can usually be diagnosed based on your medical history and a physical exam. Imaging tests may be needed if your health care provider suspects a hematoma in deeper tissues or body spaces, such as the abdomen, head, or chest. These tests may include ultrasonography or a CT scan.  TREATMENT    Hematomas usually go away on their own over time. Rarely does the blood need to be drained out of the body. Large hematomas or those that may affect vital organs will sometimes need surgical drainage or monitoring. HOME CARE INSTRUCTIONS   Apply ice to the injured area:   Put ice in a plastic bag.   Place a towel between your skin and the bag.   Leave the ice on for 20 minutes, 2-3 times a day for the first 1 to 2 days.   After the first 2 days, switch to using warm compresses on the hematoma.   Elevate the injured area to help decrease pain and swelling. Wrapping the area with an elastic bandage may also be helpful. Compression helps to reduce swelling and promotes shrinking of the hematoma. Make sure the bandage is not wrapped too tight.   If your hematoma is on a lower extremity and is painful, crutches may be helpful for a couple days.   Only take over-the-counter or prescription medicines as directed by your health care provider. SEEK IMMEDIATE MEDICAL CARE IF:   You have increasing pain, or your pain is not controlled with medicine.   You have a fever.   You have worsening swelling or discoloration.   Your skin over the hematoma breaks or starts bleeding.   Your hematoma is in your chest or abdomen and you have weakness, shortness of breath, or a change in consciousness.  Your hematoma is  on your scalp (caused by a fall or injury) and you have a worsening headache or a change in alertness or consciousness. MAKE SURE YOU:   Understand these instructions.  Will watch your condition.  Will get help right away if you are not doing well or get worse.   This information is not intended to replace advice given to you by your health care provider. Make sure you discuss any questions you have with your health care provider.   Document Released: 07/30/2004 Document Revised: 08/18/2013 Document Reviewed: 05/26/2013 Elsevier Interactive Patient Education [employer]

## 2016-02-02 ENCOUNTER — Encounter: Payer: Self-pay | Admitting: Family Medicine

## 2016-02-02 ENCOUNTER — Ambulatory Visit (INDEPENDENT_AMBULATORY_CARE_PROVIDER_SITE_OTHER): Payer: Commercial Managed Care - HMO

## 2016-02-02 ENCOUNTER — Ambulatory Visit (INDEPENDENT_AMBULATORY_CARE_PROVIDER_SITE_OTHER): Payer: Commercial Managed Care - HMO | Admitting: Family Medicine

## 2016-02-02 VITALS — BP 124/71 | HR 85 | Temp 97.4°F | Ht 67.5 in | Wt 238.0 lb

## 2016-02-02 DIAGNOSIS — M79672 Pain in left foot: Secondary | ICD-10-CM

## 2016-02-02 NOTE — Assessment & Plan Note (Signed)
Patient has increased swelling and bruising and pain in that left foot. Concern for possible hairline fracture that could've been missed immediately will repeat x-ray to see if any hairline fracture show. If not is likely contusion and will continue to monitor INRs

## 2016-02-02 NOTE — Progress Notes (Signed)
BP 124/71 mmHg  Pulse 85  Temp(Src) 97.4 F (36.3 C) (Oral)  Ht 5' 7.5" (1.715 m)  Wt 238 lb (107.956 kg)  BMI 36.70 kg/m2   Subjective:    Patient ID: [name], male    DOB: [birth year], 82 y.o.   MRN: [id number]  HPI: [name] is a 82 y.o. male presenting on 02/02/2016 for Still having pain in left foot   HPI Increased foot pain left Patient comes in today with increased foot pain on that left side on the lateral aspect of the midfoot. He is also feels like his had more swelling and bruising since the other day. He was here on 01/30/2016 seen Dr. Wendi Snipes. It is next her at that time which did not show any fractures. He was ruled out contusion and likely because his Coumadin levels were causing him to bleed and cause excessive swelling. He says the pains just not improved. He denies any fevers or chills or warmth in the area. The bruising is mostly extended over through his toes. He does have pain with ambulation but is able to ambulate. He denies any numbness or weakness or inability to move his toes.  Relevant past medical, surgical, family and social history reviewed and updated as indicated. Interim medical history since our last visit reviewed. Allergies and medications reviewed and updated.  Review of Systems  Constitutional: Negative for fever.  HENT: Negative for ear discharge and ear pain.   Eyes: Negative for discharge and visual disturbance.  Respiratory: Negative for shortness of breath and wheezing.   Cardiovascular: Negative for chest pain and leg swelling.  Gastrointestinal: Negative for abdominal pain, diarrhea and constipation.  Genitourinary: Negative for difficulty urinating.  Musculoskeletal: Positive for joint swelling and arthralgias. Negative for back pain and gait problem.  Skin: Positive for color change. Negative for rash.  Neurological: Negative for syncope, light-headedness and headaches.  All other systems reviewed and are  negative.  Per HPI unless specifically indicated above     Medication List       This list is accurate as of: 02/02/16  1:47 PM.  Always use your most recent med list.               amLODipine 5 MG tablet  Commonly known as:  NORVASC  Take 1 tablet (5 mg total) by mouth daily.     ASPIRIN CHILDRENS 81 MG chewable tablet  Generic drug:  aspirin  Chew 81 mg by mouth daily.     citalopram 20 MG tablet  Commonly known as:  CELEXA  Take 0.5 tablets (10 mg total) by mouth daily.     fentaNYL 50 MCG/HR  Commonly known as:  DURAGESIC - dosed mcg/hr  PLACE 1 PATCH ONTO THE SKIN EVERY 3 DAYS     finasteride 5 MG tablet  Commonly known as:  PROSCAR  Take 1 tablet (5 mg total) by mouth daily.     Fish Oil 1200 MG Caps  Take 1 capsule by mouth 5 (five) times daily.     furosemide 40 MG tablet  Commonly known as:  LASIX  Take 1 tablet (40 mg total) by mouth daily.     glucose blood test strip  Commonly known as:  ACCU-CHEK AVIVA PLUS  Test 2-3 X a day and prn   Dx. E11.9     HUGO ROLLING WALKER ELITE Misc  1 each by Does not apply route daily.     losartan 50 MG tablet  Commonly known as:  COZAAR  Take 1 tablet (50 mg total) by mouth daily.     magnesium (amino acid chelate) 133 MG tablet  Take 1 tablet by mouth 2 (two) times daily.     metFORMIN 500 MG 24 hr tablet  Commonly known as:  GLUCOPHAGE-XR  Take 1 tablet (500 mg total) by mouth daily with breakfast.     nitroGLYCERIN 0.4 MG SL tablet  Commonly known as:  NITROSTAT     omeprazole 20 MG capsule  Commonly known as:  PRILOSEC  Take 1 capsule (20 mg total) by mouth daily.     oxyCODONE-acetaminophen 5-325 MG tablet  Commonly known as:  ROXICET  Take 1 tablet by mouth every 8 (eight) hours as needed for severe pain.     rosuvastatin 5 MG tablet  Commonly known as:  CRESTOR  Take 1 tablet (5 mg total) by mouth daily.     STOOL SOFTENER 100 MG capsule  Generic drug:  docusate sodium  Take 300 mg by  mouth 2 (two) times daily as needed for mild constipation.     terazosin 2 MG capsule  Commonly known as:  HYTRIN  Take 1 capsule (2 mg total) by mouth at bedtime.     vitamin B-12 1000 MCG tablet  Commonly known as:  CYANOCOBALAMIN  Take 1,000 mcg by mouth daily.     Vitamin D3 2000 units capsule  Take 2,000 Units by mouth 2 (two) times daily.     warfarin 5 MG tablet  Commonly known as:  COUMADIN  TAKE 1 OR 2 TABLETS DAILY AS DIRECTED           Objective:    BP 124/71 mmHg  Pulse 85  Temp(Src) 97.4 F (36.3 C) (Oral)  Ht 5' 7.5" (1.715 m)  Wt 238 lb (107.956 kg)  BMI 36.70 kg/m2  Wt Readings from Last 3 Encounters:  02/02/16 238 lb (107.956 kg)  01/30/16 237 lb 9.6 oz (107.775 kg)  12/27/15 240 lb (108.863 kg)    Physical Exam  Constitutional: He is oriented to person, place, and time. He appears well-developed and well-nourished. No distress.  Eyes: Conjunctivae and EOM are normal. Pupils are equal, round, and reactive to light. Right eye exhibits no discharge. No scleral icterus.  Cardiovascular: Normal rate, regular rhythm, normal heart sounds and intact distal pulses.   No murmur heard. Pulmonary/Chest: Effort normal and breath sounds normal. No respiratory distress. He has no wheezes.  Musculoskeletal: Normal range of motion. He exhibits no edema.       Left foot: There is tenderness (Tenderness is worse over the lateral aspect of the midfoot over the fourth and fifth metatarsals.) and swelling (swelling and bruising especially over the lateral aspect of his foot.). There is normal range of motion, normal capillary refill and no crepitus.  Neurological: He is alert and oriented to person, place, and time. Coordination normal.  Skin: Skin is warm and dry. No rash noted. He is not diaphoretic.  Psychiatric: He has a normal mood and affect. His behavior is normal.  Vitals reviewed.   Results for orders placed or performed in visit on 01/29/16  POCT INR  Result  Value Ref Range   INR 3.6    Left foot x-ray:no fractures or bony abnormalities are noted. Await radiologist's final read. No changes from previous x-ray that I can see    Assessment & Plan:   Problem List Items Addressed This Visit      Other  Foot pain, left - Primary    Patient has increased swelling and bruising and pain in that left foot. Concern for possible hairline fracture that could've been missed immediately will repeat x-ray to see if any hairline fracture show. If not is likely contusion and will continue to monitor INRs      Relevant Orders   DG Foot Complete Left       Follow up plan: Return if symptoms worsen or fail to improve.  Counseling provided for all of the vaccine components Orders Placed This Encounter  Procedures  . DG Foot Complete Left    Caryl Pina, MD [facility] 02/02/2016, 1:47 PM

## 2016-02-09 ENCOUNTER — Ambulatory Visit (INDEPENDENT_AMBULATORY_CARE_PROVIDER_SITE_OTHER): Payer: Commercial Managed Care - HMO | Admitting: Pharmacist

## 2016-02-09 DIAGNOSIS — Z7901 Long term (current) use of anticoagulants: Secondary | ICD-10-CM

## 2016-02-09 DIAGNOSIS — I82409 Acute embolism and thrombosis of unspecified deep veins of unspecified lower extremity: Secondary | ICD-10-CM | POA: Diagnosis not present

## 2016-02-09 LAB — POCT INR: INR: 2.6

## 2016-02-09 NOTE — Patient Instructions (Signed)
Anticoagulation Dose Instructions as of 02/09/2016      [name] Tue Wed Thu Fri Sat   New Dose 10 mg 7.5 mg 10 mg 10 mg 10 mg 7.5 mg 10 mg    Description        Continue currrent warfarin dose of 2 tablets or 10mg  daily except 7.5mg  on Mondays and Fridays.       INR was 2.6 today

## 2016-02-16 ENCOUNTER — Other Ambulatory Visit: Payer: Self-pay | Admitting: Nurse Practitioner

## 2016-02-16 MED ORDER — FENTANYL 50 MCG/HR TD PT72: MEDICATED_PATCH | TRANSDERMAL | Status: DC

## 2016-02-16 NOTE — Telephone Encounter (Signed)
Fentanyl rx ready for pick up  

## 2016-02-16 NOTE — Telephone Encounter (Signed)
Patient aware that rx is ready to be picked up.  

## 2016-02-23 ENCOUNTER — Ambulatory Visit (INDEPENDENT_AMBULATORY_CARE_PROVIDER_SITE_OTHER): Payer: Commercial Managed Care - HMO | Admitting: Pharmacist

## 2016-02-23 DIAGNOSIS — I82409 Acute embolism and thrombosis of unspecified deep veins of unspecified lower extremity: Secondary | ICD-10-CM

## 2016-02-23 DIAGNOSIS — Z7901 Long term (current) use of anticoagulants: Secondary | ICD-10-CM | POA: Diagnosis not present

## 2016-02-23 LAB — POCT INR: INR: 2.3

## 2016-02-23 NOTE — Patient Instructions (Signed)
Anticoagulation Dose Instructions as of 02/23/2016      [name] Tue Wed Thu Fri Sat   New Dose 10 mg 7.5 mg 10 mg 10 mg 10 mg 7.5 mg 10 mg    Description        Continue currrent warfarin dose of 2 tablets or 10mg  daily except 7.5mg  on Mondays and Fridays.       INR was 2.3 today

## 2016-03-07 ENCOUNTER — Ambulatory Visit: Payer: Commercial Managed Care - HMO | Admitting: Family Medicine

## 2016-03-11 ENCOUNTER — Ambulatory Visit (INDEPENDENT_AMBULATORY_CARE_PROVIDER_SITE_OTHER): Payer: Commercial Managed Care - HMO | Admitting: Family Medicine

## 2016-03-11 ENCOUNTER — Encounter: Payer: Self-pay | Admitting: Family Medicine

## 2016-03-11 VITALS — BP 136/82 | HR 80 | Temp 97.1°F | Ht 67.5 in | Wt 239.6 lb

## 2016-03-11 DIAGNOSIS — I10 Essential (primary) hypertension: Secondary | ICD-10-CM

## 2016-03-11 DIAGNOSIS — C61 Malignant neoplasm of prostate: Secondary | ICD-10-CM

## 2016-03-11 DIAGNOSIS — G8929 Other chronic pain: Secondary | ICD-10-CM | POA: Diagnosis not present

## 2016-03-11 DIAGNOSIS — F1190 Opioid use, unspecified, uncomplicated: Secondary | ICD-10-CM

## 2016-03-11 DIAGNOSIS — M549 Dorsalgia, unspecified: Secondary | ICD-10-CM

## 2016-03-11 DIAGNOSIS — E1142 Type 2 diabetes mellitus with diabetic polyneuropathy: Secondary | ICD-10-CM | POA: Diagnosis not present

## 2016-03-11 DIAGNOSIS — Z1211 Encounter for screening for malignant neoplasm of colon: Secondary | ICD-10-CM

## 2016-03-11 LAB — BAYER DCA HB A1C WAIVED: HB A1C (BAYER DCA - WAIVED): 7.9 % — ABNORMAL HIGH (ref <7.0)

## 2016-03-11 MED ORDER — FENTANYL 50 MCG/HR TD PT72: 50.0000 ug | MEDICATED_PATCH | TRANSDERMAL | Status: DC

## 2016-03-11 MED ORDER — FENTANYL 50 MCG/HR TD PT72: MEDICATED_PATCH | TRANSDERMAL | Status: DC

## 2016-03-11 NOTE — Progress Notes (Signed)
   HPI  Patient presents today here for f/u chornic pain, DM2, and protatae cancer  S/p Protate Cancer D xfor several years, no surgical interventions, no metastatic disease, hasnt seen uro since June 2016  Some memorly loss, states has been going on for 2+ years Foot pain esolved.  Diabetes Average fasting blood sgar is125-145 Good metformin complia side effects Watches his diet minimally.  States he has chronic pan from low back, neck, and BL shoulder OA Hs been on fentanyl for 3+ years.   PMH: Smoking status noted ROS: Per HPI  Objective: BP 136/82 mmHg  Pulse 80  Temp(Src) 97.1 F (36.2 C) (Oral)  Ht 5' 7.5" (1.715 m)  Wt 239 lb 9.6 oz (108.682 kg)  BMI 36.95 kg/m2 Gen: NAD, alert, cooperative with exam HEENT: NCAT CV: RRR, good S1/S2, no murmur Resp: CTABL, no wheezes, non-labored Ext: No edema, warm Neuro: Alert and oriented, No gross deficits  Assessment and plan:  # Dm2 Control slipping, continue metformin Monitor closely his renal function HAs optho  # Chronic pain Refilled fentanyl X 3 months [facility]  [place] database reviewed - no abbarences Discussed pain contract which he will sign next visit  # Prostate cancer Last PSA elevated from baseline, [facility] Repeat in 3 months     Orders Placed This Encounter  Procedures  . Fecal occult blood, imunochemical    Standing Status: Future     Number of Occurrences:      Standing Expiration Date: 03/12/2016  . Bayer DCA Hb A1c Waived  . Microalbumin / creatinine urine ratio  . ToxASSURE Select 13 (MW), Urine  . CMP14+EGFR    Meds ordered this encounter  Medications  . fentaNYL (DURAGESIC - DOSED MCG/HR) 50 MCG/HR    Sig: PLACE 1 PATCH ONTO THE SKIN EVERY 3 DAYS    Dispense:  10 patch    Refill:  0    Please do not fill until 03/16/2016  . fentaNYL (DURAGESIC - DOSED MCG/HR) 50 MCG/HR    Sig: Place 1 patch (50 mcg total) onto the skin every 3 (three) days.    Dispense:  10 patch   Refill:  0    Please do not fill until 04/16/2016  . fentaNYL (DURAGESIC - DOSED MCG/HR) 50 MCG/HR    Sig: Place 1 patch (50 mcg total) onto the skin every 3 (three) days.    Dispense:  10 patch    Refill:  0    Please do not fill until 05/16/2016    Laroy Apple, MD [facility] 03/11/2016, 2:25 PM

## 2016-03-11 NOTE — Addendum Note (Signed)
Addended by: Liliane Bade on: 03/11/2016 03:40 PM   Modules accepted: Orders

## 2016-03-11 NOTE — Patient Instructions (Signed)
Great to see you!  Please come back to discuss your memory in 3-4 weeks, we can also discuss our pain agreement then as well

## 2016-03-13 LAB — FECAL OCCULT BLOOD, IMMUNOCHEMICAL: Fecal Occult Bld: NEGATIVE

## 2016-03-14 ENCOUNTER — Other Ambulatory Visit: Payer: Commercial Managed Care - HMO

## 2016-03-14 LAB — CMP14+EGFR
ALBUMIN: 4.1 g/dL (ref 3.5–4.7)
ALT: 19 IU/L (ref 0–44)
AST: 22 IU/L (ref 0–40)
Albumin/Globulin Ratio: 1.9 (ref 1.2–2.2)
Alkaline Phosphatase: 61 IU/L (ref 39–117)
BILIRUBIN TOTAL: 0.4 mg/dL (ref 0.0–1.2)
BUN / CREAT RATIO: 12 (ref 10–22)
BUN: 16 mg/dL (ref 8–27)
CO2: 26 mmol/L (ref 18–29)
CREATININE: 1.35 mg/dL — AB (ref 0.76–1.27)
Calcium: 8.7 mg/dL (ref 8.6–10.2)
Chloride: 98 mmol/L (ref 96–106)
GFR, EST AFRICAN AMERICAN: 56 mL/min/{1.73_m2} — AB (ref >59)
GFR, EST NON AFRICAN AMERICAN: 48 mL/min/{1.73_m2} — AB (ref >59)
GLUCOSE: 177 mg/dL — AB (ref 65–99)
Globulin, Total: 2.2 g/dL (ref 1.5–4.5)
Potassium: 4.3 mmol/L (ref 3.5–5.2)
Sodium: 140 mmol/L (ref 134–144)
TOTAL PROTEIN: 6.3 g/dL (ref 6.0–8.5)

## 2016-03-15 LAB — TOXASSURE SELECT 13 (MW), URINE: PDF: 0

## 2016-03-15 LAB — MICROALBUMIN / CREATININE URINE RATIO
CREATININE, UR: 74.6 mg/dL
MICROALB/CREAT RATIO: 60.6 mg/g creat — ABNORMAL HIGH (ref 0.0–30.0)
MICROALBUM., U, RANDOM: 45.2 ug/mL

## 2016-03-25 ENCOUNTER — Other Ambulatory Visit: Payer: Self-pay | Admitting: Nurse Practitioner

## 2016-03-27 ENCOUNTER — Ambulatory Visit (INDEPENDENT_AMBULATORY_CARE_PROVIDER_SITE_OTHER): Payer: Commercial Managed Care - HMO | Admitting: Pharmacist

## 2016-03-27 DIAGNOSIS — Z86718 Personal history of other venous thrombosis and embolism: Secondary | ICD-10-CM

## 2016-03-27 DIAGNOSIS — Z7901 Long term (current) use of anticoagulants: Secondary | ICD-10-CM

## 2016-03-27 LAB — COAGUCHEK XS/INR WAIVED
INR: 2.4 — AB (ref 0.9–1.1)
PROTHROMBIN TIME: 29.1 s

## 2016-03-27 NOTE — Patient Instructions (Signed)
Anticoagulation Dose Instructions as of 03/27/2016      Dorene Grebe Tue Wed Thu Fri Sat   New Dose 10 mg 7.5 mg 10 mg 10 mg 10 mg 7.5 mg 10 mg    Description        Continue currrent warfarin dose of 2 tablets or 10mg  daily except 7.5mg  on Mondays and Fridays.       INR was 2.4 today

## 2016-04-10 ENCOUNTER — Other Ambulatory Visit: Payer: Self-pay | Admitting: Nurse Practitioner

## 2016-04-18 ENCOUNTER — Encounter (INDEPENDENT_AMBULATORY_CARE_PROVIDER_SITE_OTHER): Payer: Self-pay

## 2016-04-18 ENCOUNTER — Ambulatory Visit (INDEPENDENT_AMBULATORY_CARE_PROVIDER_SITE_OTHER): Payer: Commercial Managed Care - HMO | Admitting: Family Medicine

## 2016-04-18 ENCOUNTER — Encounter: Payer: Self-pay | Admitting: Family Medicine

## 2016-04-18 VITALS — BP 137/78 | HR 73 | Temp 98.0°F | Ht 67.5 in | Wt 236.6 lb

## 2016-04-18 DIAGNOSIS — Z7901 Long term (current) use of anticoagulants: Secondary | ICD-10-CM

## 2016-04-18 DIAGNOSIS — Z86718 Personal history of other venous thrombosis and embolism: Secondary | ICD-10-CM

## 2016-04-18 LAB — COAGUCHEK XS/INR WAIVED
INR: 3 — ABNORMAL HIGH (ref 0.9–1.1)
Prothrombin Time: 36.3 s

## 2016-04-18 NOTE — Progress Notes (Signed)
   HPI  Patient presents today to discuss memory and to request help getting into a nursing home.  Patient explains that he feels much more forgetful than he did a few years ago. He states that recently he has had difficulty managing his medicines, he's been forgetting food on the stove and almost causing fires. He has difficulty walking around his house He is okay dressing himself  His daughter is with him and agrees that he seems much more forgetful than previously. She is not confident that he can manage his medicines, however with the help of local pharmacy he gets a pillbox that's very detailed.   PMH: Smoking status noted ROS: Per HPI  Objective: BP 137/78 mmHg  Pulse 73  Temp(Src) 98 F (36.7 C) (Oral)  Ht 5' 7.5" (1.715 m)  Wt 236 lb 9.6 oz (107.321 kg)  BMI 36.49 kg/m2 Gen: NAD, alert, cooperative with exam HEENT: NCAT CV: RRR, good S1/S2, no murmur Resp: CTABL, no wheezes, non-labored Ext: No edema, warm Neuro: Alert and oriented, No gross deficits  Assessment and plan:  # Memory loss MMSE is preserved, 26 Discussed long term monitoring He appears to have more significant disease than the MMSE has indicated They request help getting into a nursing home, will work with nursing to start paperwork.    INR therapeutic today, no changes (3.0)  Laroy Apple, MD [facility] 04/18/2016, 5:32 PM

## 2016-04-18 NOTE — Patient Instructions (Signed)
Great to meet you! 

## 2016-04-26 ENCOUNTER — Ambulatory Visit (INDEPENDENT_AMBULATORY_CARE_PROVIDER_SITE_OTHER): Payer: Commercial Managed Care - HMO

## 2016-04-26 ENCOUNTER — Ambulatory Visit (INDEPENDENT_AMBULATORY_CARE_PROVIDER_SITE_OTHER): Payer: Commercial Managed Care - HMO | Admitting: Family Medicine

## 2016-04-26 ENCOUNTER — Encounter: Payer: Self-pay | Admitting: Family Medicine

## 2016-04-26 VITALS — BP 139/74 | HR 89 | Temp 97.5°F | Ht 67.5 in | Wt 234.6 lb

## 2016-04-26 DIAGNOSIS — Z7901 Long term (current) use of anticoagulants: Secondary | ICD-10-CM

## 2016-04-26 DIAGNOSIS — M25562 Pain in left knee: Secondary | ICD-10-CM | POA: Diagnosis not present

## 2016-04-26 DIAGNOSIS — R0789 Other chest pain: Secondary | ICD-10-CM | POA: Diagnosis not present

## 2016-04-26 DIAGNOSIS — M25511 Pain in right shoulder: Secondary | ICD-10-CM

## 2016-04-26 DIAGNOSIS — Z86718 Personal history of other venous thrombosis and embolism: Secondary | ICD-10-CM | POA: Diagnosis not present

## 2016-04-26 DIAGNOSIS — M542 Cervicalgia: Secondary | ICD-10-CM

## 2016-04-26 LAB — COAGUCHEK XS/INR WAIVED
INR: 2.6 — AB (ref 0.9–1.1)
PROTHROMBIN TIME: 31.1 s

## 2016-04-26 NOTE — Progress Notes (Signed)
Subjective:  Patient ID: [name], male    DOB: 1930-12-29  Age: 82 y.o. MRN: [id number]  CC: injuries from fall   HPI [name] presents for fell when standing from seated yesterday. Tried to catch himself on the recliner, but instead the recliner flipped over causing him to impact the arm of the chair with his torso on the right side, wrenching his neck. Tried to catch himself with RUE and it is now painful to move. Denies dyspnea. Hurts to breathe deeply. Also has pain at the LLE anteriorly over tibial tuberosity. Pt. Has multiple chronic pains from arthritis of the hips and spine. Uses fentanyl patch for relief.   History [name] has a past medical history of DVT of leg (deep venous thrombosis) ([place]) (1999); Hypertension; Hiatal hernia; Thyroid nodule; Goiter; Sleep apnea; Type 2 diabetes mellitus ([place]); Chronic kidney disease; Diabetic retinopathy; Diabetic neuropathy ([place]); Osteopenia; CAD (coronary artery disease); Sleep apnea; GERD (gastroesophageal reflux disease); Diabetes mellitus; Hyperlipidemia; HTN (hypertension); Prostate cancer ([place]); Chronic back pain; DVT (deep venous thrombosis) ([place]); Lumbar spondylosis; and CHF (congestive heart failure) ([place]).   He has past surgical history that includes Tonsilectomy, adenoidectomy, bilateral myringotomy and tubes; Inguinal hernia repair; Knee arthroscopy; biopsy of right ear; Cholecystectomy; Cataract extraction, bilateral; Tonsillectomy and adenoidectomy; Appendectomy; and Inguinal hernia repair.   His family history includes Cancer (age of onset: 30) in his father; Colon cancer in his sister; Deep vein thrombosis in his mother; Dementia in his sister and sister; Depression in his sister; Diabetes in his mother; Hyperlipidemia in his mother; Hypertension in his mother; Prostate cancer in his father; Pulmonary embolism in his mother; Scleroderma in his brother.He reports that he quit smoking about 12 years ago. His smoking  use included Cigarettes, Pipe, and Cigars. He quit smokeless tobacco use about 12 years ago. He reports that he does not drink alcohol or use illicit drugs.    ROS Review of Systems  Constitutional: Negative for fever, chills and diaphoresis.  HENT: Negative for rhinorrhea and sore throat.   Respiratory: Negative for cough and shortness of breath.   Cardiovascular: Negative for chest pain.  Gastrointestinal: Negative for abdominal pain.  Musculoskeletal: Positive for myalgias, back pain, arthralgias, gait problem and neck pain.  Skin: Negative for rash.  Neurological: Negative for weakness and headaches.    Objective:  BP 139/74 mmHg  Pulse 89  Temp(Src) 97.5 F (36.4 C) (Oral)  Ht 5' 7.5" (1.715 m)  Wt 234 lb 9.6 oz (106.414 kg)  BMI 36.18 kg/m2  SpO2 94%  BP Readings from Last 3 Encounters:  04/26/16 139/74  04/18/16 137/78  03/11/16 136/82    Wt Readings from Last 3 Encounters:  04/26/16 234 lb 9.6 oz (106.414 kg)  04/18/16 236 lb 9.6 oz (107.321 kg)  03/11/16 239 lb 9.6 oz (108.682 kg)     Physical Exam  Constitutional: He appears well-developed and well-nourished.  HENT:  Head: Normocephalic and atraumatic.  Right Ear: Tympanic membrane and external ear normal. No decreased hearing is noted.  Left Ear: Tympanic membrane and external ear normal. No decreased hearing is noted.  Mouth/Throat: No oropharyngeal exudate or posterior oropharyngeal erythema.  Eyes: Pupils are equal, round, and reactive to light.  Neck: Normal range of motion. Neck supple.  Cardiovascular: Normal rate and regular rhythm.   No murmur heard. Pulmonary/Chest: Breath sounds normal. No respiratory distress.  Abdominal: Soft. Bowel sounds are normal. He exhibits no mass. There is no tenderness.  Musculoskeletal: He exhibits tenderness.  Decreased range of motion at the neck for rotation. Also at the right shoulder for abduction and extension. Tender to palpation of the right lateral to  posterior neck musculature.  Moderate tenderness of the right lower chest at the anterior axillary line.  Tender at the upper one third of the tib-fib region of the left lower extremity.  Of note is that in spite of his use of Coumadin there is no bruising.  Vitals reviewed.    Lab Results  Component Value Date   WBC 6.4 09/19/2015   HGB 12.1* 09/19/2015   HCT 36.4* 09/19/2015   PLT 144* 09/19/2015   GLUCOSE 177* 03/14/2016   CHOL 163 12/06/2015   TRIG 183* 12/06/2015   HDL 47 12/06/2015   LDLCALC 79 12/06/2015   ALT 19 03/14/2016   AST 22 03/14/2016   NA 140 03/14/2016   K 4.3 03/14/2016   CL 98 03/14/2016   CREATININE 1.35* 03/14/2016   BUN 16 03/14/2016   CO2 26 03/14/2016   TSH 0.647 07/12/2014   PSA 5.5* 10/28/2014   INR 3.0* 04/18/2016   HGBA1C 7.3 12/06/2015    Ct Angio Chest Pe W/cm &/or Wo Cm  09/19/2015  CLINICAL DATA:  Diaphoresis, shortness of Breath, right chest pain radiating into the epigastric region. History of DVT. EXAM: CT ANGIOGRAPHY CHEST WITH CONTRAST TECHNIQUE: Multidetector CT imaging of the chest was performed using the standard protocol during bolus administration of intravenous contrast. Multiplanar CT image reconstructions and MIPs were obtained to evaluate the vascular anatomy. CONTRAST:  8mL OMNIPAQUE IOHEXOL 350 MG/ML SOLN COMPARISON:  Chest x-ray earlier today. FINDINGS: Mediastinum/Lymph Nodes: No pulmonary emboli or thoracic aortic dissection identified. No masses or pathologically enlarged lymph nodes identified. Extensive coronary artery calcifications in all 3 major coronary vessels. Lungs/Pleura: No pulmonary mass, infiltrate, or effusion. Upper abdomen: No acute findings. Musculoskeletal: No chest wall mass or suspicious bone lesions identified. 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 Review of the MIP images confirms the above findings. IMPRESSION: No evidence of pulmonary embolus. Coronary artery disease. 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 Electronically Signed   By: Rolm Baptise M.D.   On: 09/19/2015 15:40   Dg Chest Port 1 View  09/19/2015  CLINICAL DATA:  Elevated D-dimer EXAM: PORTABLE CHEST - 1 VIEW COMPARISON:  07/12/2014 FINDINGS: Heart size is enlarged. No pleural effusion or edema identified. No airspace consolidation noted. The visualized osseous structures are unremarkable. IMPRESSION: 1. No acute cardiopulmonary abnormality. Electronically Signed   By: Kerby Moors M.D.   On: 09/19/2015 14:37    Assessment & Plan:   [place] was seen today for injuries from fall.  Diagnoses and all orders for this visit:  Cervicalgia -     DG Cervical Spine Complete; Future  Pain in joint of right shoulder -     DG Shoulder Right; Future  Chest wall pain -     DG Ribs Unilateral W/Chest Right; Future  Arthralgia of left lower leg -     DG Tibia/Fibula Left; Future  Chronic anticoagulation -     CoaguChek XS/INR Waived  History of DVT of lower extremity -     CoaguChek XS/INR Waived   Contiiinue meds as is. Heat as needed. Avoid pain meds other thanthose already in use.    I am having Mr. Heindl maintain his aspirin, Vitamin D3, vitamin B-12, Fish Oil, docusate sodium, magnesium (amino acid chelate), glucose blood, rosuvastatin, HUGO ROLLING WALKER ELITE, nitroGLYCERIN, finasteride,  losartan, amLODipine, omeprazole, furosemide, metFORMIN, terazosin, citalopram, oxyCODONE-acetaminophen, fentaNYL, fentaNYL, fentaNYL, and warfarin.  No orders of the defined types were placed in this encounter.     Follow-up: Return in about 1 month (around 05/26/2016), or if symptoms worsen or fail to improve.  Claretta Fraise, M.D.

## 2016-05-14 ENCOUNTER — Ambulatory Visit (INDEPENDENT_AMBULATORY_CARE_PROVIDER_SITE_OTHER): Payer: Commercial Managed Care - HMO | Admitting: Family Medicine

## 2016-05-14 ENCOUNTER — Encounter: Payer: Self-pay | Admitting: Family Medicine

## 2016-05-14 ENCOUNTER — Ambulatory Visit (INDEPENDENT_AMBULATORY_CARE_PROVIDER_SITE_OTHER): Payer: Commercial Managed Care - HMO

## 2016-05-14 VITALS — BP 104/61 | HR 92 | Temp 97.6°F | Ht 67.5 in | Wt 234.0 lb

## 2016-05-14 DIAGNOSIS — E113599 Type 2 diabetes mellitus with proliferative diabetic retinopathy without macular edema, unspecified eye: Secondary | ICD-10-CM

## 2016-05-14 DIAGNOSIS — M25551 Pain in right hip: Secondary | ICD-10-CM

## 2016-05-14 NOTE — Progress Notes (Signed)
   HPI  Patient presents today here to follow-up with continued right hip pain.  Patient's lines he has chronic right hip pain, however it's been much worse after a fall that he was seen for on April 28.  He describes right-sided groin pain worse with walking, abducting his leg, and many movements.  He denies any difficulty walking except for the pain.  His fentanyl patches are helping, however they're not helping completely. He requests a refill of the oxycodone which I explained is probably not a good idea given that he has been falling lately.  He has 1 more refill of fentanyl in a few days, he will follow up in 3 weeks for chronic pain management.  PMH: Smoking status noted ROS: Per HPI  Objective: BP 104/61 mmHg  Pulse 92  Temp(Src) 97.6 F (36.4 C) (Oral)  Ht 5' 7.5" (1.715 m)  Wt 234 lb (106.142 kg)  BMI 36.09 kg/m2 Gen: NAD, alert, cooperative with exam HEENT: NCAT CV: RRR, good S1/S2, no murmur Resp: CTABL, no wheezes, non-labored Ext: No edema, warm Neuro: Alert and oriented, No gross deficits  MSK R hip with pain with abduction of leg, increased pain with abduction of the leg and flexion/adduction of the lower leg rolling the head of the femur into th ejoint, walks with a limp  Plain film of the right hip Good joint space with legs in natural position, with flexion of the leg he has joint space narrowing  Assessment and plan:  # Right hip pain X-ray today without any acute findings He does appear to have mild to moderate degenerative changes, joint spaces narrow especially with leg abduction Consider steroid injection, will consider referring to sports medicine for this Official radiology read pending  All of next month for chronic pain management  Diabetic retinopathy He describes severe pain with injections of his left eye. I explained that he should talk this over with his ophthalmologist who has already offered alternative treatments.   Orders  Placed This Encounter  Procedures  . DG HIP UNILAT W OR W/O PELVIS 2-3 VIEWS RIGHT    Standing Status: Future     Number of Occurrences: 1     Standing Expiration Date: 07/13/2017    Order Specific Question:  Reason for Exam (SYMPTOM  OR DIAGNOSIS REQUIRED)    Answer:  hip pain    Order Specific Question:  Preferred imaging location?    Answer:  Internal    No orders of the defined types were placed in this encounter.    Laroy Apple, MD [facility] 05/14/2016, 5:04 PM

## 2016-05-14 NOTE — Patient Instructions (Signed)
Great to see you!  Come back to see [place] in 3 week or so to discuss your pain.   If it will be helpful, we can refer to you to a sport medicine doctor in [place] who can do hip injections. We will call and let you know what the x ray says and if we are sending a referral

## 2016-05-24 ENCOUNTER — Ambulatory Visit (INDEPENDENT_AMBULATORY_CARE_PROVIDER_SITE_OTHER): Payer: Commercial Managed Care - HMO | Admitting: Pharmacist

## 2016-05-24 DIAGNOSIS — Z7901 Long term (current) use of anticoagulants: Secondary | ICD-10-CM | POA: Diagnosis not present

## 2016-05-24 DIAGNOSIS — Z86718 Personal history of other venous thrombosis and embolism: Secondary | ICD-10-CM | POA: Diagnosis not present

## 2016-05-24 LAB — COAGUCHEK XS/INR WAIVED
INR: 3.3 — ABNORMAL HIGH (ref 0.9–1.1)
PROTHROMBIN TIME: 39.8 s

## 2016-05-24 NOTE — Patient Instructions (Signed)
Anticoagulation Dose Instructions as of 05/24/2016      [name] Tue Wed Thu Fri Sat   New Dose 10 mg 7.5 mg 10 mg 7.5 mg 10 mg 7.5 mg 10 mg    Description        No warfarin tomorrow - Saturday, May 27th.  Then decrease dose to 1 and 1/2 tablets on mondays, wednesdays and fridays.  Take 2 tablets all other days.      INR was 3.3 today

## 2016-05-31 ENCOUNTER — Other Ambulatory Visit: Payer: Self-pay | Admitting: Nurse Practitioner

## 2016-05-31 ENCOUNTER — Ambulatory Visit: Payer: Commercial Managed Care - HMO | Admitting: Nurse Practitioner

## 2016-06-01 ENCOUNTER — Other Ambulatory Visit: Payer: Self-pay | Admitting: Nurse Practitioner

## 2016-06-06 ENCOUNTER — Ambulatory Visit (INDEPENDENT_AMBULATORY_CARE_PROVIDER_SITE_OTHER): Payer: Commercial Managed Care - HMO | Admitting: Family Medicine

## 2016-06-06 ENCOUNTER — Telehealth: Payer: Self-pay | Admitting: Nurse Practitioner

## 2016-06-06 ENCOUNTER — Encounter: Payer: Self-pay | Admitting: Family Medicine

## 2016-06-06 VITALS — BP 128/73 | HR 89 | Temp 96.9°F | Ht 67.5 in | Wt 231.4 lb

## 2016-06-06 DIAGNOSIS — C61 Malignant neoplasm of prostate: Secondary | ICD-10-CM | POA: Diagnosis not present

## 2016-06-06 DIAGNOSIS — E1142 Type 2 diabetes mellitus with diabetic polyneuropathy: Secondary | ICD-10-CM

## 2016-06-06 DIAGNOSIS — Z86718 Personal history of other venous thrombosis and embolism: Secondary | ICD-10-CM | POA: Diagnosis not present

## 2016-06-06 DIAGNOSIS — Z7901 Long term (current) use of anticoagulants: Secondary | ICD-10-CM | POA: Diagnosis not present

## 2016-06-06 DIAGNOSIS — M549 Dorsalgia, unspecified: Secondary | ICD-10-CM

## 2016-06-06 DIAGNOSIS — G8929 Other chronic pain: Secondary | ICD-10-CM | POA: Diagnosis not present

## 2016-06-06 LAB — BAYER DCA HB A1C WAIVED: HB A1C: 7.3 % — AB (ref <7.0)

## 2016-06-06 LAB — COAGUCHEK XS/INR WAIVED
INR: 2.6 — AB (ref 0.9–1.1)
PROTHROMBIN TIME: 31.4 s

## 2016-06-06 MED ORDER — FENTANYL 50 MCG/HR TD PT72: MEDICATED_PATCH | TRANSDERMAL | Status: DC

## 2016-06-06 MED ORDER — FENTANYL 50 MCG/HR TD PT72: 50.0000 ug | MEDICATED_PATCH | TRANSDERMAL | Status: DC

## 2016-06-06 NOTE — Telephone Encounter (Signed)
Fill out new FL2 they will pick up 06/07/16

## 2016-06-06 NOTE — Patient Instructions (Signed)
Great to see you!  Lets follow up in 3 months unless you are in [facility]  We will call with labs within 1 week

## 2016-06-06 NOTE — Progress Notes (Signed)
   HPI  Patient presents today here for follow-up of chronic pain.  Fentanyl patches are helping well, he does have some uncontrolled pain, however he understands that increased narcotics needs more difficulty with balance. He does not want increased doses of narcotics today He has no problems with the patches. He has intermittent constipation which is dealt with with over-the-counter medications.  History of DVT No signs of bleeding, taking Coumadin regularly.  Prostate cancer Would like his PSA checked radius to a planned follow-up with urology.  Type 2 diabetes Good medication compliance  Patient and I had a long discussion about him going to nursing home today. He thinks that overall he will be a positive change for him as he feels he needs more support than his family can provide.  PMH: Smoking status noted ROS: Per HPI  Objective: BP 128/73 mmHg  Pulse 89  Temp(Src) 96.9 F (36.1 C) (Oral)  Ht 5' 7.5" (1.715 m)  Wt 231 lb 6.4 oz (104.962 kg)  BMI 35.69 kg/m2 Gen: NAD, alert, cooperative with exam HEENT: NCAT CV: RRR, good S1/S2, no murmur Resp: CTABL, no wheezes, non-labored Ext: No edema, warm Neuro: Alert and oriented, No gross deficits  Assessment and plan:  # Chronic pain syndrome Refilled fentanyl patches 3 months Return to clinic in 3 months   # Type 2 diabetes A1c at goal at 7.3, considering age over 3 I will not push to less than 7. Continue metformin  # Chronic anticoagulation, history of DVT INR is therapeutic today, recheck in one month  Labs drawn for prostate    Orders Placed This Encounter  Procedures  . Bayer DCA Hb A1c Waived  . CMP14+EGFR  . PSA, total and free  . CBC with Differential    Meds ordered this encounter  Medications  . fentaNYL (DURAGESIC - DOSED MCG/HR) 50 MCG/HR    Sig: Place 1 patch (50 mcg total) onto the skin every 3 (three) days.    Dispense:  10 patch    Refill:  0    Please do not fill until 07/16/2016    . fentaNYL (DURAGESIC - DOSED MCG/HR) 50 MCG/HR    Sig: PLACE 1 PATCH ONTO THE SKIN EVERY 3 DAYS    Dispense:  10 patch    Refill:  0    Please do not fill until 06/16/2016  . fentaNYL (DURAGESIC - DOSED MCG/HR) 50 MCG/HR    Sig: Place 1 patch (50 mcg total) onto the skin every 3 (three) days.    Dispense:  10 patch    Refill:  0    Please do not fill until 08/16/2016    Laroy Apple, MD [facility] 06/06/2016, 5:40 PM

## 2016-06-07 LAB — CMP14+EGFR
ALT: 17 IU/L (ref 0–44)
AST: 21 IU/L (ref 0–40)
Albumin/Globulin Ratio: 1.6 (ref 1.2–2.2)
Albumin: 4.2 g/dL (ref 3.5–4.7)
Alkaline Phosphatase: 64 IU/L (ref 39–117)
BUN / CREAT RATIO: 15 (ref 10–24)
BUN: 16 mg/dL (ref 8–27)
Bilirubin Total: 0.4 mg/dL (ref 0.0–1.2)
CALCIUM: 9.4 mg/dL (ref 8.6–10.2)
CHLORIDE: 99 mmol/L (ref 96–106)
CO2: 26 mmol/L (ref 18–29)
Creatinine, Ser: 1.09 mg/dL (ref 0.76–1.27)
GFR calc Af Amer: 72 mL/min/{1.73_m2} (ref >59)
GFR calc non Af Amer: 62 mL/min/{1.73_m2} (ref >59)
GLUCOSE: 97 mg/dL (ref 65–99)
Globulin, Total: 2.6 g/dL (ref 1.5–4.5)
POTASSIUM: 4.6 mmol/L (ref 3.5–5.2)
Sodium: 142 mmol/L (ref 134–144)
TOTAL PROTEIN: 6.8 g/dL (ref 6.0–8.5)

## 2016-06-07 LAB — CBC WITH DIFFERENTIAL/PLATELET
BASOS: 1 %
Basophils Absolute: 0 10*3/uL (ref 0.0–0.2)
EOS (ABSOLUTE): 0.1 10*3/uL (ref 0.0–0.4)
EOS: 2 %
HEMATOCRIT: 39 % (ref 37.5–51.0)
Hemoglobin: 12.8 g/dL (ref 12.6–17.7)
Immature Grans (Abs): 0 10*3/uL (ref 0.0–0.1)
Immature Granulocytes: 0 %
LYMPHS ABS: 4 10*3/uL — AB (ref 0.7–3.1)
Lymphs: 56 %
MCH: 27.5 pg (ref 26.6–33.0)
MCHC: 32.8 g/dL (ref 31.5–35.7)
MCV: 84 fL (ref 79–97)
MONOS ABS: 0.3 10*3/uL (ref 0.1–0.9)
Monocytes: 5 %
NEUTROS ABS: 2.6 10*3/uL (ref 1.4–7.0)
Neutrophils: 36 %
Platelets: 180 10*3/uL (ref 150–379)
RBC: 4.66 x10E6/uL (ref 4.14–5.80)
RDW: 16 % — AB (ref 12.3–15.4)
WBC: 7.1 10*3/uL (ref 3.4–10.8)

## 2016-06-07 LAB — PSA, TOTAL AND FREE
PROSTATE SPECIFIC AG, SERUM: 6.5 ng/mL — AB (ref 0.0–4.0)
PSA, Free Pct: 10.6 %
PSA, Free: 0.69 ng/mL

## 2016-07-04 ENCOUNTER — Encounter: Payer: Self-pay | Admitting: Pharmacist

## 2016-07-10 ENCOUNTER — Telehealth: Payer: Self-pay | Admitting: Pharmacist

## 2016-07-10 NOTE — Telephone Encounter (Signed)
Spoke with patient's daughter.  He is now at [facility] - they are monitoring his INR / Protime and adjusting warfarin.

## 2016-08-14 ENCOUNTER — Other Ambulatory Visit (HOSPITAL_COMMUNITY): Payer: Self-pay | Admitting: Internal Medicine

## 2016-08-14 ENCOUNTER — Other Ambulatory Visit (HOSPITAL_COMMUNITY): Payer: Self-pay | Admitting: Nurse Practitioner

## 2016-08-14 DIAGNOSIS — R221 Localized swelling, mass and lump, neck: Secondary | ICD-10-CM

## 2016-08-19 ENCOUNTER — Ambulatory Visit (HOSPITAL_COMMUNITY)
Admission: RE | Admit: 2016-08-19 | Discharge: 2016-08-19 | Disposition: A | Discharge status: Home / Self Care | Payer: Medicare Other | Source: Ambulatory Visit | Attending: Internal Medicine | Admitting: Internal Medicine

## 2016-08-19 DIAGNOSIS — E079 Disorder of thyroid, unspecified: Secondary | ICD-10-CM | POA: Insufficient documentation

## 2016-08-19 DIAGNOSIS — R221 Localized swelling, mass and lump, neck: Secondary | ICD-10-CM | POA: Diagnosis present

## 2016-08-19 LAB — POCT I-STAT CREATININE: CREATININE: 1.6 mg/dL — AB (ref 0.61–1.24)

## 2016-08-19 MED ORDER — IOPAMIDOL (ISOVUE-300) INJECTION 61%
60.0000 mL | Freq: Once | INTRAVENOUS | Status: AC | PRN
  Administered 2016-08-19: 60 mL via INTRAVENOUS

## 2016-08-29 ENCOUNTER — Other Ambulatory Visit (HOSPITAL_COMMUNITY): Payer: Self-pay | Admitting: Nurse Practitioner

## 2016-08-29 DIAGNOSIS — E079 Disorder of thyroid, unspecified: Secondary | ICD-10-CM

## 2016-09-06 ENCOUNTER — Ambulatory Visit: Payer: Commercial Managed Care - HMO

## 2016-09-06 ENCOUNTER — Other Ambulatory Visit (HOSPITAL_COMMUNITY): Payer: Self-pay | Admitting: Nurse Practitioner

## 2016-09-06 ENCOUNTER — Ambulatory Visit (HOSPITAL_COMMUNITY)
Admission: RE | Admit: 2016-09-06 | Discharge: 2016-09-06 | Disposition: A | Discharge status: Home / Self Care | Payer: Medicare Other | Source: Ambulatory Visit | Attending: Nurse Practitioner | Admitting: Nurse Practitioner

## 2016-09-06 DIAGNOSIS — E079 Disorder of thyroid, unspecified: Secondary | ICD-10-CM

## 2016-09-30 ENCOUNTER — Other Ambulatory Visit: Payer: Self-pay | Admitting: Nurse Practitioner

## 2016-09-30 DIAGNOSIS — E041 Nontoxic single thyroid nodule: Secondary | ICD-10-CM

## 2016-11-07 ENCOUNTER — Inpatient Hospital Stay: Admission: RE | Admit: 2016-11-07 | Payer: Medicare Other | Source: Ambulatory Visit

## 2016-12-10 ENCOUNTER — Other Ambulatory Visit (HOSPITAL_COMMUNITY): Payer: Self-pay | Admitting: Nurse Practitioner

## 2016-12-10 DIAGNOSIS — R410 Disorientation, unspecified: Secondary | ICD-10-CM

## 2016-12-10 DIAGNOSIS — R42 Dizziness and giddiness: Secondary | ICD-10-CM

## 2016-12-18 ENCOUNTER — Ambulatory Visit (HOSPITAL_COMMUNITY)
Admission: RE | Admit: 2016-12-18 | Discharge: 2016-12-18 | Disposition: A | Discharge status: Home / Self Care | Payer: Medicare Other | Source: Ambulatory Visit | Attending: Nurse Practitioner | Admitting: Nurse Practitioner

## 2016-12-18 DIAGNOSIS — R410 Disorientation, unspecified: Secondary | ICD-10-CM | POA: Diagnosis not present

## 2016-12-18 DIAGNOSIS — R42 Dizziness and giddiness: Secondary | ICD-10-CM | POA: Insufficient documentation

## 2016-12-18 DIAGNOSIS — I6782 Cerebral ischemia: Secondary | ICD-10-CM | POA: Insufficient documentation

## 2017-04-21 ENCOUNTER — Encounter: Payer: Self-pay | Admitting: Neurology

## 2017-06-30 ENCOUNTER — Encounter: Payer: Self-pay | Admitting: Neurology

## 2017-06-30 ENCOUNTER — Ambulatory Visit (INDEPENDENT_AMBULATORY_CARE_PROVIDER_SITE_OTHER): Payer: Medicare Other | Admitting: Neurology

## 2017-06-30 VITALS — BP 130/72 | HR 62 | Ht 67.5 in | Wt 203.0 lb

## 2017-06-30 DIAGNOSIS — H8110 Benign paroxysmal vertigo, unspecified ear: Secondary | ICD-10-CM

## 2017-06-30 DIAGNOSIS — I951 Orthostatic hypotension: Secondary | ICD-10-CM

## 2017-06-30 DIAGNOSIS — G44229 Chronic tension-type headache, not intractable: Secondary | ICD-10-CM

## 2017-06-30 NOTE — Patient Instructions (Signed)
1.  Consider cognitive behavioral therapy to address headache, chronic pain and anxiety 2.  Use CPAP as uncontrolled sleep apnea causes headache 3.  Take care when moving, standing, walking 4.  Consider meclizine as last resort to treat dizziness.

## 2017-06-30 NOTE — Progress Notes (Signed)
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  History is supplemented by PCP note.  He has had headache and dizziness for about a year and a half.  The headaches are Amilcar bifrontal 5/10 non-throbbing pain with no associated nausea, vomiting, photophobia or phonophobia.  Stress makes it worse.  Sleep helps relieve it.  He does not take medication for the headache but does take opioids daily to treat his chronic pain.  He has two types of dizziness.  One type is a brief spinning sensation lasting seconds that is triggered by movement.  Staying still helps relieve it.  He also has episodes where he feels like he may "black out".  He had a CT of the head without contrast on 12/18/16 to evaluate for dizziness, which was personally reviewed and revealed moderate chronic small vessel ischemic changes but no acute findings.  He has side effects to multiple medications including gabapentin, Lyrica, and NSAIDs.  PAST MEDICAL HISTORY: Past Medical History:  Diagnosis Date  . CAD (coronary artery disease)    (Left main normal, LAD 30-40% mid stenosis, diagonal 30-40% stenosis, circumflex 30-40% stenosis, right coronary artery 30-40% stenosis. 2003.)  . CHF (congestive heart failure) ([place])   . Chronic back pain   . Chronic kidney disease   . Diabetes mellitus   .  Diabetic neuropathy ([place])   . Diabetic retinopathy    right eye   . DVT (deep venous thrombosis) ([place])   . DVT of leg (deep venous thrombosis) ([place]) 1999   RLE   . GERD (gastroesophageal reflux disease)   . Goiter    stable x many year - last endo exam 08/2010  . Hiatal hernia   . HTN (hypertension)   . Hyperlipidemia   . Hypertension   . Lumbar spondylosis   . Osteopenia   . Prostate cancer ([place])   . Sleep apnea   . Sleep apnea    CPAP  . Thyroid nodule   . Type 2 diabetes mellitus ([place])     PAST SURGICAL HISTORY: Past Surgical History:  Procedure Laterality Date  . APPENDECTOMY    . biopsy of right ear    . CATARACT EXTRACTION, BILATERAL    . CHOLECYSTECTOMY    . INGUINAL HERNIA REPAIR     bilateral  . INGUINAL HERNIA REPAIR     Bilateral  . KNEE ARTHROSCOPY     left   . TONSILECTOMY, ADENOIDECTOMY, BILATERAL MYRINGOTOMY AND TUBES    . TONSILLECTOMY AND ADENOIDECTOMY      MEDICATIONS: Current Outpatient Prescriptions on File Prior to Visit  Medication Sig Dispense Refill  . ACCU-CHEK AVIVA PLUS test strip CHECK BLOOD SUGAR 2 TO 3 TIMES A DAY AND AS NEEDED 100 each 4  . amLODipine (NORVASC) 5 MG tablet TAKE 1 TABLET DAILY 90 tablet 0  . aspirin (ASPIRIN CHILDRENS) 81 MG chewable tablet Chew 81 mg by mouth daily.      . Cholecalciferol (VITAMIN D3) 2000 UNITS capsule Take 2,000 Units by  mouth 2 (two) times daily.     . citalopram (CELEXA) 20 MG tablet Take 0.5 tablets (10 mg total) by mouth daily. 15 tablet 0  . docusate sodium (STOOL SOFTENER) 100 MG capsule Take 300 mg by mouth 2 (two) times daily as needed for mild constipation.     . fentaNYL (DURAGESIC - DOSED MCG/HR) 50 MCG/HR Place 1 patch (50 mcg total) onto the skin every 3 (three) days. 10 patch 0  . finasteride (PROSCAR) 5 MG tablet Take 1 tablet (5 mg total) by mouth daily. 90 tablet 1  . furosemide (LASIX) 40 MG tablet Take 1 tablet (40 mg total) by mouth daily. 90 tablet 1  . losartan (COZAAR) 50 MG  tablet Take 1 tablet (50 mg total) by mouth daily. 90 tablet 1  . metFORMIN (GLUCOPHAGE-XR) 500 MG 24 hr tablet Take 1 tablet (500 mg total) by mouth daily with breakfast. 180 tablet 1  . Misc. Devices (HUGO ROLLING WALKER ELITE) MISC 1 each by Does not apply route daily. 1 each 0  . nitroGLYCERIN (NITROSTAT) 0.4 MG SL tablet     . Omega-3 Fatty Acids (FISH OIL) 1200 MG CAPS Take 1 capsule by mouth 5 (five) times daily.    Marland Kitchen omeprazole (PRILOSEC) 20 MG capsule Take 1 capsule (20 mg total) by mouth daily. 90 capsule 1  . oxyCODONE-acetaminophen (ROXICET) 5-325 MG tablet Take 1 tablet by mouth every 8 (eight) hours as needed for severe pain. 20 tablet 0  . rosuvastatin (CRESTOR) 5 MG tablet TAKE 1 TABLET EVERY DAY 30 tablet 2  . Specialty Vitamins Products (MAGNESIUM, AMINO ACID CHELATE,) 133 MG tablet Take 1 tablet by mouth 2 (two) times daily.    Marland Kitchen terazosin (HYTRIN) 2 MG capsule Take 1 capsule (2 mg total) by mouth at bedtime. 90 capsule 1  . vitamin B-12 (CYANOCOBALAMIN) 1000 MCG tablet Take 1,000 mcg by mouth daily.    Marland Kitchen warfarin (COUMADIN) 5 MG tablet TAKE 1 OR 2 TABLETS DAILY AS DIRECTED 60 tablet 2   No current facility-administered medications on file prior to visit.     ALLERGIES: Allergies  Allergen Reactions  . Actos [Pioglitazone]     Allergic reaction  . Bactrim Other (See Comments)    Unknown   . Doxycycline     Caused black spots in his eyes  . Flagyl [Metronidazole Hcl]     unknown  . Gabapentin   . Lyrica [Pregabalin]     Increase appetite   . Motrin [Ibuprofen]   . Nsaids     Swelling    . Ultram [Tramadol Hcl]     Itching / rash     . Vioxx [Rofecoxib]     Swelling    . Celebrex [Celecoxib] Rash  . Celebrex [Celecoxib] Rash  . Morphine And Related Rash    FAMILY HISTORY: Family History  Problem Relation Age of Onset  . Hyperlipidemia Mother   . Hypertension Mother   . Deep vein thrombosis Mother   . Pulmonary embolism Mother   . Diabetes Mother     . Prostate cancer Father   . Cancer Father 61  . Colon cancer Sister   . Depression Sister   . Dementia Sister   . Dementia Sister   . Scleroderma Brother     SOCIAL HISTORY: Social History   Social History  . Marital status: Divorced    Spouse name: N/A  . Number of children: 4  . Years of education: N/A   Occupational History  .  Retired Retired   Social History Main Topics  . Smoking status: Former Smoker    Types: Cigarettes, Pipe, Cigars    Quit date: 11/08/2003  . Smokeless tobacco: Former Systems developer    Quit date: 11/08/2003  . Alcohol use No  . Drug use: No  . Sexual activity: No   Other Topics Concern  . Not on file   Social History Narrative   ** Merged History Encounter **        REVIEW OF SYSTEMS: Constitutional: No fevers, chills, or sweats, no generalized fatigue, change in appetite Eyes: No visual changes, double vision, eye pain Ear, nose and throat: No hearing loss, ear pain, nasal congestion, sore throat Cardiovascular: No chest pain, palpitations Respiratory:  No shortness of breath at rest or with exertion, wheezes GastrointestinaI: No nausea, vomiting, diarrhea, abdominal pain, fecal incontinence Genitourinary:  No dysuria, urinary retention or frequency Musculoskeletal:  No neck pain, back pain Integumentary: No rash, pruritus, skin lesions Neurological: as above Psychiatric: No depression, insomnia, anxiety Endocrine: No palpitations, fatigue, diaphoresis, mood swings, change in appetite, change in weight, increased thirst Hematologic/Lymphatic:  No purpura, petechiae. Allergic/Immunologic: no itchy/runny eyes, nasal congestion, recent allergic reactions, rashes  PHYSICAL EXAM: Vitals:   06/30/17 0950  BP: 130/72  Pulse: 62  Orthostatic vitals showed a drop in SBP from 138 supine to 100 standing. General: No acute distress.  Patient appears well-groomed.  Head:  Normocephalic/atraumatic Eyes:  fundi examined but not visualized Neck:  supple, no paraspinal tenderness, full range of motion Back: No paraspinal tenderness Heart: regular rate and rhythm Lungs: Clear to auscultation bilaterally. Vascular: No carotid bruits. Neurological Exam: Mental status: alert and oriented to person, place, and time, recent and remote memory intact, fund of knowledge intact, attention and concentration intact, speech fluent and not dysarthric, language intact. Cranial nerves: CN I: not tested CN II: pupils equal, round and reactive to light, visual fields intact CN III, IV, VI:  full range of motion, no nystagmus, no ptosis CN V: facial sensation intact CN VII: upper and lower face symmetric CN VIII: hearing intact CN IX, X: gag intact, uvula midline CN XI: sternocleidomastoid and trapezius muscles intact CN XII: tongue midline Bulk & Tone: normal, no fasciculations. Motor:  5/5 throughout  Sensation: temperature and vibration sensation mildly reduced in feet. Deep Tendon Reflexes:  1+ in upper extremities, absent in lower extremities, toes downgoing.  Finger to nose testing:  Without dysmetria.  Heel to shin:  Unable to perform Gait:  Wide-based gait, slow and cautious.  Difficulty turning and unable to tandem walk.  Romberg with mild sway  IMPRESSION: 1.  Chronic tension type headache.  Complicated by medication overuse and possibly OSA (just started reusing the CPAP two weeks ago) 2.  BPPV 3.  Orthostatic hypotension, possibly related to diabetes.  PLAN: 1.  Ideally, due to multiple medications, multiple co-morbidities and history of medication intolerance, I would recommend a nonpharmacologic treatment, namely cognitive behavioral therapy or biofeedback 2.  Continue CPAP 3.  Take care/caution when ambulating or maneuvering  4.  Consider meclizine as last resort for vertigo  Thank you for allowing me to take part in the care of this patient.  Metta Clines, DO  CC:  Hilbert Corrigan, MD  [place],  FNP

## 2018-01-25 ENCOUNTER — Emergency Department (HOSPITAL_COMMUNITY): Payer: Medicare Other

## 2018-01-25 ENCOUNTER — Other Ambulatory Visit: Payer: Self-pay

## 2018-01-25 ENCOUNTER — Observation Stay (HOSPITAL_COMMUNITY)
Admission: EM | Admit: 2018-01-25 | Discharge: 2018-01-26 | Disposition: A | Discharge status: Home / Self Care | Payer: Medicare Other | Attending: Internal Medicine | Admitting: Internal Medicine

## 2018-01-25 ENCOUNTER — Encounter (HOSPITAL_COMMUNITY): Payer: Self-pay | Admitting: Emergency Medicine

## 2018-01-25 DIAGNOSIS — I130 Hypertensive heart and chronic kidney disease with heart failure and stage 1 through stage 4 chronic kidney disease, or unspecified chronic kidney disease: Secondary | ICD-10-CM | POA: Insufficient documentation

## 2018-01-25 DIAGNOSIS — Z79899 Other long term (current) drug therapy: Secondary | ICD-10-CM | POA: Insufficient documentation

## 2018-01-25 DIAGNOSIS — N189 Chronic kidney disease, unspecified: Secondary | ICD-10-CM | POA: Diagnosis not present

## 2018-01-25 DIAGNOSIS — I509 Heart failure, unspecified: Secondary | ICD-10-CM | POA: Insufficient documentation

## 2018-01-25 DIAGNOSIS — I2510 Atherosclerotic heart disease of native coronary artery without angina pectoris: Secondary | ICD-10-CM | POA: Insufficient documentation

## 2018-01-25 DIAGNOSIS — I951 Orthostatic hypotension: Secondary | ICD-10-CM | POA: Diagnosis not present

## 2018-01-25 DIAGNOSIS — Z7982 Long term (current) use of aspirin: Secondary | ICD-10-CM | POA: Insufficient documentation

## 2018-01-25 DIAGNOSIS — E119 Type 2 diabetes mellitus without complications: Secondary | ICD-10-CM

## 2018-01-25 DIAGNOSIS — Z7984 Long term (current) use of oral hypoglycemic drugs: Secondary | ICD-10-CM | POA: Diagnosis not present

## 2018-01-25 DIAGNOSIS — N179 Acute kidney failure, unspecified: Secondary | ICD-10-CM | POA: Diagnosis not present

## 2018-01-25 DIAGNOSIS — Z87891 Personal history of nicotine dependence: Secondary | ICD-10-CM | POA: Insufficient documentation

## 2018-01-25 DIAGNOSIS — R55 Syncope and collapse: Principal | ICD-10-CM

## 2018-01-25 DIAGNOSIS — Z8546 Personal history of malignant neoplasm of prostate: Secondary | ICD-10-CM | POA: Diagnosis not present

## 2018-01-25 DIAGNOSIS — E785 Hyperlipidemia, unspecified: Secondary | ICD-10-CM | POA: Diagnosis present

## 2018-01-25 DIAGNOSIS — I10 Essential (primary) hypertension: Secondary | ICD-10-CM | POA: Diagnosis present

## 2018-01-25 HISTORY — DX: Headache: R51

## 2018-01-25 HISTORY — DX: Other peripheral vertigo, unspecified ear: H81.399

## 2018-01-25 HISTORY — DX: Headache, unspecified: R51.9

## 2018-01-25 HISTORY — DX: Other chronic pain: G89.29

## 2018-01-25 HISTORY — DX: Unspecified macular degeneration: H35.30

## 2018-01-25 LAB — PROTIME-INR
INR: 2.29
Prothrombin Time: 25 seconds — ABNORMAL HIGH (ref 11.4–15.2)

## 2018-01-25 LAB — CBC WITH DIFFERENTIAL/PLATELET
BASOS ABS: 0 10*3/uL (ref 0.0–0.1)
BASOS PCT: 1 %
EOS PCT: 3 %
Eosinophils Absolute: 0.2 10*3/uL (ref 0.0–0.7)
HCT: 38.6 % — ABNORMAL LOW (ref 39.0–52.0)
Hemoglobin: 12.3 g/dL — ABNORMAL LOW (ref 13.0–17.0)
Lymphocytes Relative: 48 %
Lymphs Abs: 3.1 10*3/uL (ref 0.7–4.0)
MCH: 29.1 pg (ref 26.0–34.0)
MCHC: 31.9 g/dL (ref 30.0–36.0)
MCV: 91.3 fL (ref 78.0–100.0)
Monocytes Absolute: 0.3 10*3/uL (ref 0.1–1.0)
Monocytes Relative: 5 %
Neutro Abs: 2.7 10*3/uL (ref 1.7–7.7)
Neutrophils Relative %: 43 %
PLATELETS: 156 10*3/uL (ref 150–400)
RBC: 4.23 MIL/uL (ref 4.22–5.81)
RDW: 13.9 % (ref 11.5–15.5)
WBC: 6.4 10*3/uL (ref 4.0–10.5)

## 2018-01-25 LAB — COMPREHENSIVE METABOLIC PANEL
ALK PHOS: 64 U/L (ref 38–126)
ALT: 15 U/L — AB (ref 17–63)
AST: 22 U/L (ref 15–41)
Albumin: 3.7 g/dL (ref 3.5–5.0)
Anion gap: 9 (ref 5–15)
BILIRUBIN TOTAL: 0.6 mg/dL (ref 0.3–1.2)
BUN: 26 mg/dL — ABNORMAL HIGH (ref 6–20)
CALCIUM: 9 mg/dL (ref 8.9–10.3)
CO2: 30 mmol/L (ref 22–32)
Chloride: 102 mmol/L (ref 101–111)
Creatinine, Ser: 1.64 mg/dL — ABNORMAL HIGH (ref 0.61–1.24)
GFR, EST AFRICAN AMERICAN: 42 mL/min — AB (ref >60)
GFR, EST NON AFRICAN AMERICAN: 37 mL/min — AB (ref >60)
Glucose, Bld: 130 mg/dL — ABNORMAL HIGH (ref 65–99)
Potassium: 4.1 mmol/L (ref 3.5–5.1)
Sodium: 141 mmol/L (ref 135–145)
TOTAL PROTEIN: 6.6 g/dL (ref 6.5–8.1)

## 2018-01-25 LAB — LACTIC ACID, PLASMA: LACTIC ACID, VENOUS: 1.3 mmol/L (ref 0.5–1.9)

## 2018-01-25 LAB — URINALYSIS, ROUTINE W REFLEX MICROSCOPIC
Bilirubin Urine: NEGATIVE
GLUCOSE, UA: NEGATIVE mg/dL
Hgb urine dipstick: NEGATIVE
KETONES UR: NEGATIVE mg/dL
LEUKOCYTES UA: NEGATIVE
Nitrite: NEGATIVE
PROTEIN: NEGATIVE mg/dL
Specific Gravity, Urine: 1.015 (ref 1.005–1.030)
pH: 5 (ref 5.0–8.0)

## 2018-01-25 LAB — GLUCOSE, CAPILLARY: GLUCOSE-CAPILLARY: 97 mg/dL (ref 65–99)

## 2018-01-25 LAB — I-STAT CG4 LACTIC ACID, ED: LACTIC ACID, VENOUS: 1.63 mmol/L (ref 0.5–1.9)

## 2018-01-25 LAB — MRSA PCR SCREENING: MRSA by PCR: NEGATIVE

## 2018-01-25 LAB — TROPONIN I

## 2018-01-25 MED ORDER — MAGNESIUM HYDROXIDE 400 MG/5ML PO SUSP: 30.0000 mL | Freq: Every day | ORAL | Status: DC | PRN

## 2018-01-25 MED ORDER — ACETAMINOPHEN 325 MG PO TABS: 650.0000 mg | ORAL_TABLET | Freq: Four times a day (QID) | ORAL | Status: DC | PRN

## 2018-01-25 MED ORDER — SODIUM CHLORIDE 0.9 % IV BOLUS (SEPSIS)
500.0000 mL | Freq: Once | INTRAVENOUS | Status: AC
  Administered 2018-01-25: 500 mL via INTRAVENOUS

## 2018-01-25 MED ORDER — SODIUM CHLORIDE 0.9 % IV SOLN
INTRAVENOUS | Status: DC
  Administered 2018-01-25: 500 mL via INTRAVENOUS

## 2018-01-25 MED ORDER — INSULIN ASPART 100 UNIT/ML ~~LOC~~ SOLN
0.0000 [IU] | Freq: Three times a day (TID) | SUBCUTANEOUS | Status: DC
  Administered 2018-01-26 (×2): 1 [IU] via SUBCUTANEOUS

## 2018-01-25 MED ORDER — ASPIRIN 81 MG PO CHEW
81.0000 mg | CHEWABLE_TABLET | Freq: Every day | ORAL | Status: DC
  Administered 2018-01-26: 81 mg via ORAL
  Filled 2018-01-25: qty 1

## 2018-01-25 MED ORDER — INSULIN ASPART 100 UNIT/ML ~~LOC~~ SOLN
3.0000 [IU] | Freq: Three times a day (TID) | SUBCUTANEOUS | Status: DC
  Administered 2018-01-26 (×3): 3 [IU] via SUBCUTANEOUS

## 2018-01-25 MED ORDER — PANTOPRAZOLE SODIUM 40 MG PO TBEC
40.0000 mg | DELAYED_RELEASE_TABLET | Freq: Every day | ORAL | Status: DC
  Administered 2018-01-26: 40 mg via ORAL
  Filled 2018-01-25: qty 1

## 2018-01-25 MED ORDER — AMLODIPINE BESYLATE 5 MG PO TABS
2.5000 mg | ORAL_TABLET | Freq: Every day | ORAL | Status: DC
  Administered 2018-01-26: 2.5 mg via ORAL
  Filled 2018-01-25: qty 1

## 2018-01-25 MED ORDER — WARFARIN SODIUM 5 MG PO TABS
6.0000 mg | ORAL_TABLET | Freq: Once | ORAL | Status: AC
  Administered 2018-01-25: 19:00:00 6 mg via ORAL
  Filled 2018-01-25: qty 1

## 2018-01-25 MED ORDER — SENNOSIDES-DOCUSATE SODIUM 8.6-50 MG PO TABS: 1.0000 | ORAL_TABLET | Freq: Every evening | ORAL | Status: DC | PRN

## 2018-01-25 MED ORDER — INSULIN ASPART 100 UNIT/ML ~~LOC~~ SOLN: 0.0000 [IU] | Freq: Every day | SUBCUTANEOUS | Status: DC

## 2018-01-25 MED ORDER — ATORVASTATIN CALCIUM 20 MG PO TABS
20.0000 mg | ORAL_TABLET | Freq: Every day | ORAL | Status: DC
  Administered 2018-01-25 – 2018-01-26 (×2): 20 mg via ORAL
  Filled 2018-01-25 (×2): qty 1

## 2018-01-25 MED ORDER — ADULT MULTIVITAMIN W/MINERALS CH
1.0000 | ORAL_TABLET | Freq: Every day | ORAL | Status: DC
  Administered 2018-01-25 – 2018-01-26 (×2): 1 via ORAL
  Filled 2018-01-25 (×2): qty 1

## 2018-01-25 MED ORDER — ZOLPIDEM TARTRATE 5 MG PO TABS
2.5000 mg | ORAL_TABLET | Freq: Every day | ORAL | Status: DC
  Administered 2018-01-25: 2.5 mg via ORAL
  Filled 2018-01-25: qty 1

## 2018-01-25 MED ORDER — LORATADINE 10 MG PO TABS: 10.0000 mg | ORAL_TABLET | Freq: Every day | ORAL | Status: DC | PRN

## 2018-01-25 MED ORDER — METOPROLOL SUCCINATE ER 25 MG PO TB24
12.5000 mg | ORAL_TABLET | Freq: Every day | ORAL | Status: DC
  Administered 2018-01-26: 12.5 mg via ORAL
  Filled 2018-01-25: qty 1

## 2018-01-25 MED ORDER — FENTANYL 50 MCG/HR TD PT72: 50.0000 ug | MEDICATED_PATCH | TRANSDERMAL | Status: DC

## 2018-01-25 MED ORDER — FINASTERIDE 5 MG PO TABS
5.0000 mg | ORAL_TABLET | Freq: Every day | ORAL | Status: DC
  Administered 2018-01-26: 5 mg via ORAL
  Filled 2018-01-25 (×3): qty 1

## 2018-01-25 MED ORDER — ONDANSETRON HCL 4 MG/2ML IJ SOLN: 4.0000 mg | Freq: Four times a day (QID) | INTRAMUSCULAR | Status: DC | PRN

## 2018-01-25 MED ORDER — SODIUM CHLORIDE 0.9 % IV SOLN
INTRAVENOUS | Status: DC
  Administered 2018-01-25 – 2018-01-26 (×2): via INTRAVENOUS

## 2018-01-25 MED ORDER — WARFARIN - PHARMACIST DOSING INPATIENT
Status: DC
  Administered 2018-01-25: 19:00:00

## 2018-01-25 MED ORDER — ONDANSETRON HCL 4 MG PO TABS: 4.0000 mg | ORAL_TABLET | Freq: Four times a day (QID) | ORAL | Status: DC | PRN

## 2018-01-25 MED ORDER — DOCUSATE SODIUM 100 MG PO CAPS
100.0000 mg | ORAL_CAPSULE | Freq: Two times a day (BID) | ORAL | Status: DC
  Administered 2018-01-25 – 2018-01-26 (×2): 100 mg via ORAL
  Filled 2018-01-25 (×2): qty 1

## 2018-01-25 MED ORDER — TIZANIDINE HCL 4 MG PO TABS
4.0000 mg | ORAL_TABLET | Freq: Three times a day (TID) | ORAL | Status: DC
  Administered 2018-01-25 – 2018-01-26 (×4): 4 mg via ORAL
  Filled 2018-01-25 (×4): qty 1

## 2018-01-25 MED ORDER — ACETAMINOPHEN 650 MG RE SUPP: 650.0000 mg | Freq: Four times a day (QID) | RECTAL | Status: DC | PRN

## 2018-01-25 MED ORDER — POLYETHYLENE GLYCOL 3350 17 G PO PACK
17.0000 g | PACK | Freq: Two times a day (BID) | ORAL | Status: DC
  Administered 2018-01-25 – 2018-01-26 (×2): 17 g via ORAL
  Filled 2018-01-25 (×2): qty 1

## 2018-01-25 MED ORDER — CARBOXYMETHYLCELLUL-GLYCERIN 0.5-0.9 % OP SOLN
1.0000 [drp] | Freq: Three times a day (TID) | OPHTHALMIC | Status: DC
  Filled 2018-01-25: qty 15

## 2018-01-25 MED ORDER — ESCITALOPRAM OXALATE 10 MG PO TABS
10.0000 mg | ORAL_TABLET | Freq: Every day | ORAL | Status: DC
  Administered 2018-01-26: 10 mg via ORAL
  Filled 2018-01-25: qty 1

## 2018-01-25 MED ORDER — TERAZOSIN HCL 1 MG PO CAPS
2.0000 mg | ORAL_CAPSULE | Freq: Every day | ORAL | Status: DC
  Administered 2018-01-25: 2 mg via ORAL
  Filled 2018-01-25: qty 2

## 2018-01-25 MED ORDER — DONEPEZIL HCL 5 MG PO TABS
10.0000 mg | ORAL_TABLET | Freq: Every day | ORAL | Status: DC
  Administered 2018-01-25: 10 mg via ORAL
  Filled 2018-01-25: qty 2

## 2018-01-25 NOTE — ED Provider Notes (Signed)
[facility] EMERGENCY DEPARTMENT Provider Note   CSN: [id number] Arrival date & time: 01/25/18  1015     History   Chief Complaint Chief Complaint  Patient presents with  . Loss of Consciousness    HPI [name] is a 84 y.o. male.   Loss of Consciousness    Pt was seen at 1045. Per EMS, NH report, and pt: c/o sudden onset and resolution of one episode of syncope that occurred PTA. Pt was found by NH staff "unconscious in a chair." Pt was placed in bed, sternal rub performed. Pt then became awake/alert. Pt then c/o chest pain and was given ASA. Pt's BP was "79/30" and CBG "in the 200's." EMS states pt was "bradycardic" en route. EMS gave IV NS bolus with BP improving to "131/75." Pt only c/o generalized "pains" that are his chronic pains "from all my arthritis." Pt states his right hip is especially painful today. Endorses this his pain is also per his chronic pain. Denies injury, no fall, no new neck or back pain, no palpitations, no SOB/cough, no abd pain, no N/V/D, no focal motor weakness, no tingling/numbness in extremities. No reported slurred speech or facial droop. Pt and his daughter both deny any new meds or recent changes in his usual meds.   Past Medical History:  Diagnosis Date  . CAD (coronary artery disease)    (Left main normal, LAD 30-40% mid stenosis, diagonal 30-40% stenosis, circumflex 30-40% stenosis, right coronary artery 30-40% stenosis. 2003.)  . CHF (congestive heart failure) ([place])   . Chronic back pain   . Chronic headache   . Chronic kidney disease   . Chronic pain    right hip/groin, neck, back  . Diabetes mellitus   . Diabetic neuropathy ([place])   . Diabetic retinopathy    right eye   . DVT (deep venous thrombosis) ([place])   . DVT of leg (deep venous thrombosis) ([place]) 1999   RLE   . GERD (gastroesophageal reflux disease)   . Goiter    stable x many year - last endo exam 08/2010  . Hiatal hernia   . HTN (hypertension)   . Hyperlipidemia   .  Hypertension   . Lumbar spondylosis   . Macular degeneration   . Osteopenia   . Peripheral vertigo   . Prostate cancer ([place])   . Sleep apnea   . Sleep apnea    CPAP  . Thyroid nodule   . Type 2 diabetes mellitus [facility])     Patient Active Problem List   Diagnosis Date Noted  . Cervicalgia 04/26/2016  . Chronic anticoagulation 04/18/2016  . Foot pain, left 02/02/2016  . DVT (deep venous thrombosis), unspecified laterality 10/04/2015  . BMI 33.0-33.9,adult 07/25/2014  . Chest pain 07/12/2014  . Chronic back pain   . Osteopenia   . Decubitus ulcers 11/29/2013  . Tremors of nervous system 11/29/2013  . Macular degeneration of both eyes 07/13/2013  . Hyperlipidemia   . CAD (coronary artery disease)   . HTN (hypertension) 04/13/2011  . Hiatal hernia 04/13/2011  . Lumbar spondylolysis 04/13/2011  . Thyroid nodule 04/13/2011  . Sleep apnea 04/13/2011  . Diabetes mellitus, type 2 ([place]) 04/13/2011  . Prostate cancer ([place]) 04/13/2011  . Kidney disease 04/13/2011  . Diabetic retinopathy ([place]) 04/13/2011  . Diabetic neuropathy ([place]) 04/13/2011    Past Surgical History:  Procedure Laterality Date  . APPENDECTOMY    . biopsy of right ear    . CATARACT EXTRACTION, BILATERAL    .  CHOLECYSTECTOMY    . INGUINAL HERNIA REPAIR     bilateral  . INGUINAL HERNIA REPAIR     Bilateral  . KNEE ARTHROSCOPY     left   . TONSILECTOMY, ADENOIDECTOMY, BILATERAL MYRINGOTOMY AND TUBES    . TONSILLECTOMY AND ADENOIDECTOMY         Home Medications    Prior to Admission medications   Medication Sig Start Date End Date Taking? Authorizing Provider  acetaminophen (TYLENOL) 650 MG CR tablet Take 650 mg by mouth every 8 (eight) hours. Given at 8:00 am, 2:00 pm, and 8:00 pm.   Yes [provider]  amLODipine (NORVASC) 2.5 MG tablet Take 2.5 mg by mouth daily.   Yes [provider]  aspirin (ASPIRIN CHILDRENS) 81 MG chewable tablet Chew 81 mg by mouth daily.     Yes [provider]  atorvastatin (LIPITOR) 20 MG tablet Take 20 mg by mouth daily at 6 PM.    Yes [provider]  carboxymethylcellul-glycerin (OPTIVE) 0.5-0.9 % ophthalmic solution Place 1 drop into both eyes as needed for dry eyes.   Yes [provider]  docusate sodium (STOOL SOFTENER) 100 MG capsule Take 100 mg by mouth 2 (two) times daily.    Yes [provider]  donepezil (ARICEPT) 10 MG tablet Take 10 mg by mouth at bedtime.   Yes [provider]  escitalopram (LEXAPRO) 10 MG tablet Take 10 mg by mouth daily.   Yes [provider]  fentaNYL (DURAGESIC - DOSED MCG/HR) 50 MCG/HR Place 1 patch (50 mcg total) onto the skin every 3 (three) days. 06/06/16  Yes Timmothy Euler, MD  finasteride (PROSCAR) 5 MG tablet Take 1 tablet (5 mg total) by mouth daily. 12/06/15  Yes Martin, Mary-Margaret, FNP  furosemide (LASIX) 40 MG tablet Take 1 tablet (40 mg total) by mouth daily. Patient taking differently: Take 40 mg by mouth 2 (two) times daily.  12/06/15  Yes Martin, Mary-Margaret, FNP  loratadine (CLARITIN) 10 MG tablet Take 10 mg by mouth daily as needed for allergies.   Yes [provider]  losartan (COZAAR) 50 MG tablet Take 1 tablet (50 mg total) by mouth daily. 12/06/15  Yes Martin, Mary-Margaret, FNP  magnesium hydroxide (MILK OF MAGNESIA) 400 MG/5ML suspension Take 30 mLs by mouth daily as needed for mild constipation.   Yes [provider]  Menthol, Topical Analgesic, (BIOFREEZE EX) Apply 1 application topically daily as needed (back, side, and shoulder pain).   Yes [provider]  metFORMIN (GLUCOPHAGE-XR) 500 MG 24 hr tablet Take 1 tablet (500 mg total) by mouth daily with breakfast. Patient taking differently: Take 500 mg by mouth every evening.  12/06/15  Yes Martin, Mary-Margaret, FNP  metoprolol succinate (TOPROL-XL) 25 MG 24 hr tablet Take 12.5 mg by mouth daily.   Yes [provider]  omeprazole (PRILOSEC) 20 MG  capsule Take 1 capsule (20 mg total) by mouth daily. Patient taking differently: Take 20 mg by mouth 2 (two) times daily before a meal.  12/06/15  Yes Hassell Done, Mary-Margaret, FNP  oxycodone (OXY-IR) 5 MG capsule Take 5 mg by mouth every 8 (eight) hours as needed for pain.    Yes [provider]  polyethylene glycol (MIRALAX / GLYCOLAX) packet Take 17 g by mouth 2 (two) times daily.   Yes [provider]  terazosin (HYTRIN) 2 MG capsule Take 1 capsule (2 mg total) by mouth at bedtime. 12/06/15  Yes Hassell Done, Mary-Margaret, FNP  tiZANidine (ZANAFLEX) 4  MG tablet Take 4 mg by mouth 3 (three) times daily.   Yes [provider]  warfarin (COUMADIN) 6 MG tablet Take 6 mg by mouth daily at 6 PM.   Yes [provider]  zolpidem (AMBIEN) 5 MG tablet Take 0.5 tablets by mouth at bedtime. 01/13/18  Yes [provider]  ACCU-CHEK AVIVA PLUS test strip CHECK BLOOD SUGAR 2 TO 3 TIMES A DAY AND AS NEEDED 06/03/16   Timmothy Euler, MD  Misc. Devices (HUGO ROLLING WALKER ELITE) MISC 1 each by Does not apply route daily. 08/04/15   [name], FNP    Family History Family History  Problem Relation Age of Onset  . Hyperlipidemia Mother   . Hypertension Mother   . Deep vein thrombosis Mother   . Pulmonary embolism Mother   . Diabetes Mother   . Prostate cancer Father   . Cancer Father 78  . Colon cancer Sister   . Depression Sister   . Dementia Sister   . Dementia Sister   . Scleroderma Brother     Social History Social History   Tobacco Use  . Smoking status: Former Smoker    Types: Cigarettes, Pipe, Cigars    Last attempt to quit: 11/08/2003    Years since quitting: 14.2  . Smokeless tobacco: Former Systems developer    Quit date: 11/08/2003  Substance Use Topics  . Alcohol use: No  . Drug use: No     Allergies   Actos [pioglitazone]; Bactrim; Doxycycline; Flagyl [metronidazole hcl]; Gabapentin; Lyrica [pregabalin]; Motrin [ibuprofen]; Nsaids; Ultram  [tramadol hcl]; Vioxx [rofecoxib]; Celebrex [celecoxib]; Celebrex [celecoxib]; and Morphine and related   Review of Systems Review of Systems  Cardiovascular: Positive for syncope.  ROS: Statement: All systems negative except as marked or noted in the HPI; Constitutional: Negative for fever and chills. ; ; Eyes: Negative for eye pain, redness and discharge. ; ; ENMT: Negative for ear pain, hoarseness, nasal congestion, sinus pressure and sore throat. ; ; Cardiovascular: +chest pain. Negative for palpitations, diaphoresis, dyspnea and peripheral edema. ; ; Respiratory: Negative for cough, wheezing and stridor. ; ; Gastrointestinal: Negative for nausea, vomiting, diarrhea, abdominal pain, blood in stool, hematemesis, jaundice and rectal bleeding. . ; ; Genitourinary: Negative for dysuria, flank pain and hematuria. ; ; Musculoskeletal: +chronic hip pain. Negative for back pain and neck pain. Negative for swelling and trauma.; ; Skin: Negative for pruritus, rash, abrasions, blisters, bruising and skin lesion.; ; Neuro: Negative for headache, lightheadedness and neck stiffness. Negative for extremity weakness, paresthesias, involuntary movement, seizure and +syncope.      Physical Exam Updated Vital Signs BP 120/68 (BP Location: Left Arm)   Pulse 72   Temp 97.8 F (36.6 C) (Oral)   Resp 20   Ht 5\' 8"  (1.727 m)   Wt 99.8 kg (220 lb)   SpO2 94%   BMI 33.45 kg/m    12:24:38 Orthostatic Vital Signs CB  Orthostatic Lying   BP- Lying: 116/69  Pulse- Lying: 56      Orthostatic Sitting  BP- Sitting:  89/62  Pulse- Sitting: 66      Orthostatic Standing at 0 minutes  BP- Standing at 0 minutes: 108/82  Pulse- Standing at 0 minutes: 75     Physical Exam 1050: Physical examination:  Nursing notes reviewed; Vital signs and O2 SAT reviewed;  Constitutional: Well developed, Well nourished, Well hydrated, In no acute distress; Head:  Normocephalic, atraumatic; Eyes: EOMI, PERRL, No scleral  icterus; ENMT: Mouth and pharynx  normal, Mucous membranes moist; Neck: Supple, Full range of motion, No lymphadenopathy; Cardiovascular: Regular rate and rhythm, No gallop; Respiratory: Breath sounds clear & equal bilaterally, No wheezes.  Speaking full sentences with ease, Normal respiratory effort/excursion; Chest: Nontender, Movement normal; Abdomen: Soft, Nontender, Nondistended, Normal bowel sounds; Genitourinary: No CVA tenderness; Extremities: Pulses normal, Pelvis stable. +right hip tenderness, No edema, No calf edema or asymmetry.; Neuro: AA&Ox3, Major CN grossly intact. No facial droop. Speech clear. No gross focal motor or sensory deficits in extremities.; Skin: Color normal, Warm, Dry.   ED Treatments / Results  Labs (all labs ordered are listed, but only abnormal results are displayed)   EKG  EKG Interpretation  Date/Time:  Sunday January 25 2018 10:20:52 EST Ventricular Rate:  52 PR Interval:    QRS Duration: 98 QT Interval:  454 QTC Calculation: 423 R Axis:   -29 Text Interpretation:  Sinus rhythm Prolonged PR interval Borderline left axis deviation Low voltage, precordial leads When compared with ECG of  07/12/2014 No significant change was found Confirmed by Francine Graven [phone]) on 01/25/2018 12:06:58 PM       Radiology   Procedures Procedures (including critical care time)  Medications Ordered in ED Medications - No data to display   Initial Impression / Assessment and Plan / ED Course  I have reviewed the triage vital signs and the nursing notes.  Pertinent labs & imaging results that were available during my care of the patient were reviewed by me and considered in my medical decision making (see chart for details).  MDM Reviewed: previous chart, nursing note and vitals Reviewed previous: labs and ECG Interpretation: labs, ECG, x-ray and CT scan   Results for orders placed or performed during the hospital encounter of 01/25/18  Comprehensive  metabolic panel  Result Value Ref Range   Sodium 141 135 - 145 mmol/L   Potassium 4.1 3.5 - 5.1 mmol/L   Chloride 102 101 - 111 mmol/L   CO2 30 22 - 32 mmol/L   Glucose, Bld 130 (H) 65 - 99 mg/dL   BUN 26 (H) 6 - 20 mg/dL   Creatinine, Ser 1.64 (H) 0.61 - 1.24 mg/dL   Calcium 9.0 8.9 - 10.3 mg/dL   Total Protein 6.6 6.5 - 8.1 g/dL   Albumin 3.7 3.5 - 5.0 g/dL   AST 22 15 - 41 U/L   ALT 15 (L) 17 - 63 U/L   Alkaline Phosphatase 64 38 - 126 U/L   Total Bilirubin 0.6 0.3 - 1.2 mg/dL   GFR calc non Af Amer 37 (L) >60 mL/min   GFR calc Af Amer 42 (L) >60 mL/min   Anion gap 9 5 - 15  Troponin I  Result Value Ref Range   Troponin I <0.03 <0.03 ng/mL  CBC with Differential  Result Value Ref Range   WBC 6.4 4.0 - 10.5 K/uL   RBC 4.23 4.22 - 5.81 MIL/uL   Hemoglobin 12.3 (L) 13.0 - 17.0 g/dL   HCT 38.6 (L) 39.0 - 52.0 %   MCV 91.3 78.0 - 100.0 fL   MCH 29.1 26.0 - 34.0 pg   MCHC 31.9 30.0 - 36.0 g/dL   RDW 13.9 11.5 - 15.5 %   Platelets 156 150 - 400 K/uL   Neutrophils Relative % 43 %   Neutro Abs 2.7 1.7 - 7.7 K/uL   Lymphocytes Relative 48 %   Lymphs Abs 3.1 0.7 - 4.0 K/uL   Monocytes Relative 5 %   Monocytes Absolute  0.3 0.1 - 1.0 K/uL   Eosinophils Relative 3 %   Eosinophils Absolute 0.2 0.0 - 0.7 K/uL   Basophils Relative 1 %   Basophils Absolute 0.0 0.0 - 0.1 K/uL  Urinalysis, Routine w reflex microscopic  Result Value Ref Range   Color, Urine YELLOW YELLOW   APPearance CLEAR CLEAR   Specific Gravity, Urine 1.015 1.005 - 1.030   pH 5.0 5.0 - 8.0   Glucose, UA NEGATIVE NEGATIVE mg/dL   Hgb urine dipstick NEGATIVE NEGATIVE   Bilirubin Urine NEGATIVE NEGATIVE   Ketones, ur NEGATIVE NEGATIVE mg/dL   Protein, ur NEGATIVE NEGATIVE mg/dL   Nitrite NEGATIVE NEGATIVE   Leukocytes, UA NEGATIVE NEGATIVE  Protime-INR  Result Value Ref Range   Prothrombin Time 25.0 (H) 11.4 - 15.2 seconds   INR 2.29   I-Stat CG4 Lactic Acid, ED  Result Value Ref Range   Lactic Acid,  Venous 1.63 0.5 - 1.9 mmol/L   Dg Chest 1 View Result Date: 01/25/2018 CLINICAL DATA:  Syncope.  Right hip pain. EXAM: CHEST 1 VIEW COMPARISON:  04/26/2016 FINDINGS: Heart size upper limits of normal. Chronic aortic atherosclerosis. The lungs are clear. The vascularity is normal. No visible effusion. No sign of bone injury. IMPRESSION: No active disease. Electronically Signed   By: Nelson Chimes M.D.   On: 01/25/2018 12:22   Ct Head Wo Contrast Result Date: 01/25/2018 CLINICAL DATA:  84 year old male with a history of loss of consciousness EXAM: CT HEAD WITHOUT CONTRAST TECHNIQUE: Contiguous axial images were obtained from the base of the skull through the vertex without intravenous contrast. COMPARISON:  12/18/2016 FINDINGS: Brain: No acute intracranial hemorrhage. No midline shift or mass effect. Gray-white differentiation is relatively maintained. Senescent calcifications/mineralization of the bilateral basal ganglia. Mild periventricular hypodensity, unchanged from prior. Unremarkable configuration the ventricles. Vascular: Calcifications of the anterior circulation. Skull: No acute fracture.  No aggressive bony lesions. Sinuses/Orbits: No acute finding. Other: None. IMPRESSION: CT negative for acute intracranial abnormality. Changes of chronic microvascular ischemic disease. Electronically Signed   By: Corrie Mckusick D.O.   On: 01/25/2018 11:43   Dg Hip Unilat With Pelvis 2-3 Views Right Result Date: 01/25/2018 CLINICAL DATA:  Syncope with right hip pain. EXAM: DG HIP (WITH OR WITHOUT PELVIS) 2-3V RIGHT COMPARISON:  None. FINDINGS: There is no evidence of hip fracture or dislocation. There is no evidence of arthropathy or other focal bone abnormality. IMPRESSION: Negative. Electronically Signed   By: Nelson Chimes M.D.   On: 01/25/2018 12:24    1330:  Pt orthostatic on VS and BUN/Cr slightly higher than baseline; judicious IVF given. H/H per baseline. Dx and testing d/w pt and family.  Questions  answered.  Verb understanding, agreeable to admit.  T/C to [facility] Dr. Jerilee Hoh, case discussed, including:  HPI, pertinent PM/SHx, VS/PE, dx testing, ED course and treatment:  Agreeable to admit.     Final Clinical Impressions(s) / ED Diagnoses   Final diagnoses:  Syncope    ED Discharge Orders    None        Francine Graven, DO 01/28/18 1550

## 2018-01-25 NOTE — ED Triage Notes (Addendum)
Patient brought into ER from [facility] via EMS. Alert and oriented at this time. Patient found this morning by [facility] staff unconscious in chair. Patient placed in bed with sternal rub done. Patient became alert. Per staff patient was c/o chest pain and was given 4 baby aspirin. [facility] staff stated patient was hypotensive 79/30 and blood sugar 200s. Patient bradycardic per EMS. Patient denies any chest pain to EMS and here in hospital, c/o generalized body pain which he states is normal. Patient does c/o right hip and groin pain which is new. Per patient pain radiates into right knee. IVF fluids given in route, 131/75 per EMS. Patient is DNR and DNI.

## 2018-01-25 NOTE — H&P (Signed)
History and Physical    [name]:[id number] DOB: [birth year] DOA: 01/25/2018  Referring MD/NP/PA: Francine Graven, EDP PCP: [name], FNP  Patient coming from: SNF  Chief Complaint: Passed out on wheelchair  HPI: [name] is a 84 y.o. male history is obtained from [place] notes as patient is unaware as to why he is here.  It is reported that he was sitting on his wheelchair when SNF staff came into his room and they were unable to arouse him.  EMS was called, they found him to be hypotensive with a systolic blood pressure in the 70s and bradycardic.  He was transferred to the hospital.  By the time he arrived here he was fully awake.  He was found to be orthostatic by both pulse and blood pressure parameters.  Observation has been requested for fluids.  Past Medical/Surgical History: Past Medical History:  Diagnosis Date  . CAD (coronary artery disease)    (Left main normal, LAD 30-40% mid stenosis, diagonal 30-40% stenosis, circumflex 30-40% stenosis, right coronary artery 30-40% stenosis. 2003.)  . CHF (congestive heart failure) ([place])   . Chronic back pain   . Chronic headache   . Chronic kidney disease   . Chronic pain    right hip/groin, neck, back  . Diabetes mellitus   . Diabetic neuropathy ([place])   . Diabetic retinopathy    right eye   . DVT (deep venous thrombosis) ([place])   . DVT of leg (deep venous thrombosis) ([place]) 1999   RLE   . GERD (gastroesophageal reflux disease)   . Goiter    stable x many year - last endo exam 08/2010  . Hiatal hernia   . HTN (hypertension)   . Hyperlipidemia   . Hypertension   . Lumbar spondylosis   . Macular degeneration   . Osteopenia   . Peripheral vertigo   . Prostate cancer ([place])   . Sleep apnea   . Sleep apnea    CPAP  . Thyroid nodule   . Type 2 diabetes mellitus ([place])     Past Surgical History:  Procedure Laterality Date  . APPENDECTOMY    . biopsy of right ear    . CATARACT EXTRACTION,  BILATERAL    . CHOLECYSTECTOMY    . INGUINAL HERNIA REPAIR     bilateral  . INGUINAL HERNIA REPAIR     Bilateral  . KNEE ARTHROSCOPY     left   . TONSILECTOMY, ADENOIDECTOMY, BILATERAL MYRINGOTOMY AND TUBES    . TONSILLECTOMY AND ADENOIDECTOMY      Social History:  reports that he quit smoking about 14 years ago. His smoking use included cigarettes, pipe, and cigars. He quit smokeless tobacco use about 14 years ago. He reports that he does not drink alcohol or use drugs.  Allergies: Allergies  Allergen Reactions  . Actos [Pioglitazone]     Allergic reaction  . Bactrim Other (See Comments)    Unknown   . Doxycycline     Caused black spots in his eyes  . Flagyl [Metronidazole Hcl]     unknown  . Gabapentin   . Lyrica [Pregabalin]     Increase appetite   . Motrin [Ibuprofen]   . Nsaids     Swelling    . Ultram [Tramadol Hcl]     Itching / rash     . Vioxx [Rofecoxib]     Swelling    . Celebrex [Celecoxib] Rash  . Celebrex [Celecoxib] Rash  . Morphine And  Related Rash    Family History:  Family History  Problem Relation Age of Onset  . Hyperlipidemia Mother   . Hypertension Mother   . Deep vein thrombosis Mother   . Pulmonary embolism Mother   . Diabetes Mother   . Prostate cancer Father   . Cancer Father 32  . Colon cancer Sister   . Depression Sister   . Dementia Sister   . Dementia Sister   . Scleroderma Brother     Prior to Admission medications   Medication Sig Start Date End Date Taking? Authorizing Provider  acetaminophen (TYLENOL) 650 MG CR tablet Take 650 mg by mouth every 8 (eight) hours. Given at 8:00 am, 2:00 pm, and 8:00 pm.   Yes [provider]  amLODipine (NORVASC) 2.5 MG tablet Take 2.5 mg by mouth daily.   Yes [provider]  aspirin (ASPIRIN CHILDRENS) 81 MG chewable tablet Chew 81 mg by mouth daily.     Yes [provider]  atorvastatin (LIPITOR) 20 MG tablet Take 20 mg by mouth daily at 6 PM.    Yes  [provider]  carboxymethylcellul-glycerin (OPTIVE) 0.5-0.9 % ophthalmic solution Place 1 drop into both eyes as needed for dry eyes.   Yes [provider]  docusate sodium (STOOL SOFTENER) 100 MG capsule Take 100 mg by mouth 2 (two) times daily.    Yes [provider]  donepezil (ARICEPT) 10 MG tablet Take 10 mg by mouth at bedtime.   Yes [provider]  escitalopram (LEXAPRO) 10 MG tablet Take 10 mg by mouth daily.   Yes [provider]  fentaNYL (DURAGESIC - DOSED MCG/HR) 50 MCG/HR Place 1 patch (50 mcg total) onto the skin every 3 (three) days. 06/06/16  Yes Timmothy Euler, MD  finasteride (PROSCAR) 5 MG tablet Take 1 tablet (5 mg total) by mouth daily. 12/06/15  Yes Martin, Mary-Margaret, FNP  furosemide (LASIX) 40 MG tablet Take 1 tablet (40 mg total) by mouth daily. Patient taking differently: Take 40 mg by mouth 2 (two) times daily.  12/06/15  Yes Martin, Mary-Margaret, FNP  loratadine (CLARITIN) 10 MG tablet Take 10 mg by mouth daily as needed for allergies.   Yes [provider]  losartan (COZAAR) 50 MG tablet Take 1 tablet (50 mg total) by mouth daily. 12/06/15  Yes Martin, Mary-Margaret, FNP  magnesium hydroxide (MILK OF MAGNESIA) 400 MG/5ML suspension Take 30 mLs by mouth daily as needed for mild constipation.   Yes [provider]  Menthol, Topical Analgesic, (BIOFREEZE EX) Apply 1 application topically daily as needed (back, side, and shoulder pain).   Yes [provider]  metFORMIN (GLUCOPHAGE-XR) 500 MG 24 hr tablet Take 1 tablet (500 mg total) by mouth daily with breakfast. Patient taking differently: Take 500 mg by mouth every evening.  12/06/15  Yes Martin, Mary-Margaret, FNP  metoprolol succinate (TOPROL-XL) 25 MG 24 hr tablet Take 12.5 mg by mouth daily.   Yes [provider]  omeprazole (PRILOSEC) 20 MG capsule Take 1 capsule (20 mg total) by mouth daily. Patient taking differently: Take 20 mg  by mouth 2 (two) times daily before a meal.  12/06/15  Yes Hassell Done, Mary-Margaret, FNP  oxycodone (OXY-IR) 5 MG capsule Take 5 mg by mouth every 8 (eight) hours as needed for pain.    Yes [provider]  polyethylene glycol (MIRALAX / GLYCOLAX) packet Take 17 g by mouth 2 (two) times daily.   Yes [provider]  terazosin (HYTRIN)  2 MG capsule Take 1 capsule (2 mg total) by mouth at bedtime. 12/06/15  Yes Martin, Mary-Margaret, FNP  tiZANidine (ZANAFLEX) 4 MG tablet Take 4 mg by mouth 3 (three) times daily.   Yes [provider]  warfarin (COUMADIN) 6 MG tablet Take 6 mg by mouth daily at 6 PM.   Yes [provider]  zolpidem (AMBIEN) 5 MG tablet Take 0.5 tablets by mouth at bedtime. 01/13/18  Yes [provider]  ACCU-CHEK AVIVA PLUS test strip CHECK BLOOD SUGAR 2 TO 3 TIMES A DAY AND AS NEEDED 06/03/16   Timmothy Euler, MD  Misc. Devices (HUGO ROLLING WALKER ELITE) MISC 1 each by Does not apply route daily. 08/04/15   [name], FNP    Review of Systems:  Difficult to obtain given dementia   Physical Exam: Vitals:   01/25/18 1300 01/25/18 1330 01/25/18 1453 01/25/18 1531  BP: 109/65 128/63 118/60 (!) 181/85  Pulse: (!) 52 (!) 53 (!) 54 75  Resp: 11 16 14 18   Temp:    98.2 F (36.8 C)  TempSrc:    Oral  SpO2: 91% 92% 93% 95%  Weight:    99.2 kg (218 lb 11.2 oz)  Height:    5\' 9"  (1.753 m)     Constitutional: NAD, calm, comfortable Eyes: PERRL, lids and conjunctivae normal ENMT: Mucous membranes are dry. Posterior pharynx clear of any exudate or lesions.Normal dentition.  Neck: normal, supple, no masses, no thyromegaly Respiratory: clear to auscultation bilaterally, no wheezing, no crackles. Normal respiratory effort. No accessory muscle use.  Cardiovascular: Regular rate and rhythm, no murmurs / rubs / gallops. No extremity edema. 2+ pedal pulses. No carotid bruits.  Abdomen: no tenderness, no masses palpated. No  hepatosplenomegaly. Bowel sounds positive.  Musculoskeletal: no clubbing / cyanosis. No joint deformity upper and lower extremities. Good ROM, no contractures. Normal muscle tone.  Skin: no rashes, lesions, ulcers. No induration Neurologic: CN 2-12 grossly intact. Sensation intact, DTR normal. Strength 5/5 in all 4.  Psychiatric: Normal judgment and insight. Alert and oriented x 3. Normal mood.    Labs on Admission: I have personally reviewed the following labs and imaging studies  CBC: Recent Labs  Lab 01/25/18 1101  WBC 6.4  NEUTROABS 2.7  HGB 12.3*  HCT 38.6*  MCV 91.3  PLT 811   Basic Metabolic Panel: Recent Labs  Lab 01/25/18 1101  NA 141  K 4.1  CL 102  CO2 30  GLUCOSE 130*  BUN 26*  CREATININE 1.64*  CALCIUM 9.0   GFR: Estimated Creatinine Clearance: 38.2 mL/min (A) (by C-G formula based on SCr of 1.64 mg/dL (H)). Liver Function Tests: Recent Labs  Lab 01/25/18 1101  AST 22  ALT 15*  ALKPHOS 64  BILITOT 0.6  PROT 6.6  ALBUMIN 3.7   No results for input(s): LIPASE, AMYLASE in the last 168 hours. No results for input(s): AMMONIA in the last 168 hours. Coagulation Profile: Recent Labs  Lab 01/25/18 1101  INR 2.29   Cardiac Enzymes: Recent Labs  Lab 01/25/18 1101  TROPONINI <0.03   BNP (last 3 results) No results for input(s): PROBNP in the last 8760 hours. HbA1C: No results for input(s): HGBA1C in the last 72 hours. CBG: No results for input(s): GLUCAP in the last 168 hours. Lipid Profile: No results for input(s): CHOL, HDL, LDLCALC, TRIG, CHOLHDL, LDLDIRECT in the last 72 hours. Thyroid Function Tests: No results for input(s): TSH, T4TOTAL, FREET4, T3FREE, THYROIDAB in the last 72 hours.  Anemia Panel: No results for input(s): VITAMINB12, FOLATE, FERRITIN, TIBC, IRON, RETICCTPCT in the last 72 hours. Urine analysis:    Component Value Date/Time   COLORURINE YELLOW 01/25/2018 1230   APPEARANCEUR CLEAR 01/25/2018 1230   LABSPEC 1.015  01/25/2018 1230   PHURINE 5.0 01/25/2018 1230   GLUCOSEU NEGATIVE 01/25/2018 1230   HGBUR NEGATIVE 01/25/2018 1230   BILIRUBINUR NEGATIVE 01/25/2018 1230   KETONESUR NEGATIVE 01/25/2018 1230   PROTEINUR NEGATIVE 01/25/2018 1230   UROBILINOGEN 0.2 10/08/2013 1257   NITRITE NEGATIVE 01/25/2018 1230   LEUKOCYTESUR NEGATIVE 01/25/2018 1230   Sepsis Labs: @LABRCNTIP (procalcitonin:4,lacticidven:4) )No results found for this or any previous visit (from the past 240 hour(s)).   Radiological Exams on Admission: Dg Chest 1 View  Result Date: 01/25/2018 CLINICAL DATA:  Syncope.  Right hip pain. EXAM: CHEST 1 VIEW COMPARISON:  04/26/2016 FINDINGS: Heart size upper limits of normal. Chronic aortic atherosclerosis. The lungs are clear. The vascularity is normal. No visible effusion. No sign of bone injury. IMPRESSION: No active disease. Electronically Signed   By: Nelson Chimes M.D.   On: 01/25/2018 12:22   Ct Head Wo Contrast  Result Date: 01/25/2018 CLINICAL DATA:  84 year old male with a history of loss of consciousness EXAM: CT HEAD WITHOUT CONTRAST TECHNIQUE: Contiguous axial images were obtained from the base of the skull through the vertex without intravenous contrast. COMPARISON:  12/18/2016 FINDINGS: Brain: No acute intracranial hemorrhage. No midline shift or mass effect. Gray-white differentiation is relatively maintained. Senescent calcifications/mineralization of the bilateral basal ganglia. Mild periventricular hypodensity, unchanged from prior. Unremarkable configuration the ventricles. Vascular: Calcifications of the anterior circulation. Skull: No acute fracture.  No aggressive bony lesions. Sinuses/Orbits: No acute finding. Other: None. IMPRESSION: CT negative for acute intracranial abnormality. Changes of chronic microvascular ischemic disease. Electronically Signed   By: Corrie Mckusick D.O.   On: 01/25/2018 11:43   Dg Hip Unilat With Pelvis 2-3 Views Right  Result Date:  01/25/2018 CLINICAL DATA:  Syncope with right hip pain. EXAM: DG HIP (WITH OR WITHOUT PELVIS) 2-3V RIGHT COMPARISON:  None. FINDINGS: There is no evidence of hip fracture or dislocation. There is no evidence of arthropathy or other focal bone abnormality. IMPRESSION: Negative. Electronically Signed   By: Nelson Chimes M.D.   On: 01/25/2018 12:24    EKG: Independently reviewed.  Sinus rhythm, low voltage, no acute ischemic changes  Assessment/Plan Principal Problem:   Orthostasis Active Problems:   ARF (acute renal failure) (HCC)   HTN (hypertension)   Diabetes mellitus, type 2 (HCC)   Hyperlipidemia    Questionable syncopal event -I have my doubts whether this was true syncope. -In any case he is orthostatic and this certainly could be a contributory cause. -Keep off ARB, normal saline at 100 cc an hour. -Recheck orthostatic vital signs in a.m.  Acute renal failure -Creatinine on admission is 1.64, last known creatinine was 1 about a year and a half ago. -Suspect a component of this is acute and due to dehydration.  Hyperlipidemia -Continue statin  History of DVT -Continue Coumadin as dosed per pharmacy.  Hypertension -Per report was hypotensive, is now hypertensive will resume all antihypertensive agents with the exception of Cozaar given acute renal failure.  Type 2 diabetes -Check A1c, hold metformin and place on sensitive sliding scale while in the hospital.   DVT prophylaxis: Warfarin Code Status: Full code Family Communication: Patient only Disposition Plan: Plan SNF in a.m. Consults called: None Admission status: Observation   Time Spent: 75 minutes  Lelon Frohlich MD [facility] Pager [phone]  If 7PM-7AM, please contact night-coverage [email or web address] Password [facility]  01/25/2018, 5:13 PM

## 2018-01-25 NOTE — Progress Notes (Signed)
ANTICOAGULATION CONSULT NOTE - Initial Consult  Pharmacy Consult for Coumadin (home med) Indication: needs follow up  Allergies  Allergen Reactions  . Actos [Pioglitazone]     Allergic reaction  . Bactrim Other (See Comments)    Unknown   . Doxycycline     Caused black spots in his eyes  . Flagyl [Metronidazole Hcl]     unknown  . Gabapentin   . Lyrica [Pregabalin]     Increase appetite   . Motrin [Ibuprofen]   . Nsaids     Swelling    . Ultram [Tramadol Hcl]     Itching / rash     . Vioxx [Rofecoxib]     Swelling    . Celebrex [Celecoxib] Rash  . Celebrex [Celecoxib] Rash  . Morphine And Related Rash    Patient Measurements: Height: 5\' 9"  (175.3 cm) Weight: 218 lb 11.2 oz (99.2 kg) IBW/kg (Calculated) : 70.7  Vital Signs: Temp: 98.2 F (36.8 C) (01/27 1531) Temp Source: Oral (01/27 1531) BP: 181/85 (01/27 1531) Pulse Rate: 75 (01/27 1531)  Labs: Recent Labs    01/25/18 1101  HGB 12.3*  HCT 38.6*  PLT 156  LABPROT 25.0*  INR 2.29  CREATININE 1.64*  TROPONINI <0.03    Estimated Creatinine Clearance: 38.2 mL/min (A) (by C-G formula based on SCr of 1.64 mg/dL (H)).   Medical History: Past Medical History:  Diagnosis Date  . CAD (coronary artery disease)    (Left main normal, LAD 30-40% mid stenosis, diagonal 30-40% stenosis, circumflex 30-40% stenosis, right coronary artery 30-40% stenosis. 2003.)  . CHF (congestive heart failure) ([place])   . Chronic back pain   . Chronic headache   . Chronic kidney disease   . Chronic pain    right hip/groin, neck, back  . Diabetes mellitus   . Diabetic neuropathy ([place])   . Diabetic retinopathy    right eye   . DVT (deep venous thrombosis) ([place])   . DVT of leg (deep venous thrombosis) ([place]) 1999   RLE   . GERD (gastroesophageal reflux disease)   . Goiter    stable x many year - last endo exam 08/2010  . Hiatal hernia   . HTN (hypertension)   . Hyperlipidemia   . Hypertension   . Lumbar spondylosis   .  Macular degeneration   . Osteopenia   . Peripheral vertigo   . Prostate cancer ([place])   . Sleep apnea   . Sleep apnea    CPAP  . Thyroid nodule   . Type 2 diabetes mellitus (HCC)     Medications:  Medications Prior to Admission  Medication Sig Dispense Refill Last Dose  . acetaminophen (TYLENOL) 650 MG CR tablet Take 650 mg by mouth every 8 (eight) hours. Given at 8:00 am, 2:00 pm, and 8:00 pm.   01/25/2018 at Unknown time  . amLODipine (NORVASC) 2.5 MG tablet Take 2.5 mg by mouth daily.   01/25/2018 at Unknown time  . aspirin (ASPIRIN CHILDRENS) 81 MG chewable tablet Chew 81 mg by mouth daily.     01/25/2018 at Unknown time  . atorvastatin (LIPITOR) 20 MG tablet Take 20 mg by mouth daily at 6 PM.    01/24/2018 at Unknown time  . carboxymethylcellul-glycerin (OPTIVE) 0.5-0.9 % ophthalmic solution Place 1 drop into both eyes as needed for dry eyes.   unknown  . docusate sodium (STOOL SOFTENER) 100 MG capsule Take 100 mg by mouth 2 (two) times daily.    01/25/2018 at Unknown  time  . donepezil (ARICEPT) 10 MG tablet Take 10 mg by mouth at bedtime.   01/24/2018 at Unknown time  . escitalopram (LEXAPRO) 10 MG tablet Take 10 mg by mouth daily.   01/25/2018 at Unknown time  . fentaNYL (DURAGESIC - DOSED MCG/HR) 50 MCG/HR Place 1 patch (50 mcg total) onto the skin every 3 (three) days. 10 patch 0 01/24/2018 at Unknown time  . finasteride (PROSCAR) 5 MG tablet Take 1 tablet (5 mg total) by mouth daily. 90 tablet 1 01/25/2018 at Unknown time  . furosemide (LASIX) 40 MG tablet Take 1 tablet (40 mg total) by mouth daily. (Patient taking differently: Take 40 mg by mouth 2 (two) times daily. ) 90 tablet 1 01/25/2018 at Unknown time  . loratadine (CLARITIN) 10 MG tablet Take 10 mg by mouth daily as needed for allergies.   unknown  . losartan (COZAAR) 50 MG tablet Take 1 tablet (50 mg total) by mouth daily. 90 tablet 1 01/25/2018 at Unknown time  . magnesium hydroxide (MILK OF MAGNESIA) 400 MG/5ML suspension Take 30  mLs by mouth daily as needed for mild constipation.   01/12/2018  . Menthol, Topical Analgesic, (BIOFREEZE EX) Apply 1 application topically daily as needed (back, side, and shoulder pain).   unknown  . metFORMIN (GLUCOPHAGE-XR) 500 MG 24 hr tablet Take 1 tablet (500 mg total) by mouth daily with breakfast. (Patient taking differently: Take 500 mg by mouth every evening. ) 180 tablet 1 01/24/2018 at Unknown time  . metoprolol succinate (TOPROL-XL) 25 MG 24 hr tablet Take 12.5 mg by mouth daily.   01/25/2018 at 0800  . omeprazole (PRILOSEC) 20 MG capsule Take 1 capsule (20 mg total) by mouth daily. (Patient taking differently: Take 20 mg by mouth 2 (two) times daily before a meal. ) 90 capsule 1 01/25/2018 at Unknown time  . oxycodone (OXY-IR) 5 MG capsule Take 5 mg by mouth every 8 (eight) hours as needed for pain.    01/24/2018 at Unknown time  . polyethylene glycol (MIRALAX / GLYCOLAX) packet Take 17 g by mouth 2 (two) times daily.   01/25/2018 at Unknown time  . terazosin (HYTRIN) 2 MG capsule Take 1 capsule (2 mg total) by mouth at bedtime. 90 capsule 1 01/24/2018 at Unknown time  . tiZANidine (ZANAFLEX) 4 MG tablet Take 4 mg by mouth 3 (three) times daily.   01/25/2018 at Unknown time  . warfarin (COUMADIN) 6 MG tablet Take 6 mg by mouth daily at 6 PM.   01/24/2018 at 1800  . zolpidem (AMBIEN) 5 MG tablet Take 0.5 tablets by mouth at bedtime.   01/24/2018 at Unknown time  . ACCU-CHEK AVIVA PLUS test strip CHECK BLOOD SUGAR 2 TO 3 TIMES A DAY AND AS NEEDED 100 each 4 Taking  . Misc. Devices (HUGO ROLLING WALKER ELITE) MISC 1 each by Does not apply route daily. 1 each 0 Taking    Assessment: INR therapeutic on admission.  Indication for warfarin needs to be clarified.    Goal of Therapy:  INR 2-3 Monitor platelets by anticoagulation protocol: Yes   Plan:  Coumadin 6mg  today x 1 (home dose) INR daily  [place] Crane, Blimie Vaness A 01/25/2018,5:58 PM

## 2018-01-25 NOTE — ED Notes (Signed)
[facility] called and inquired about update on pt disposition. [facility] aware pt to be admitted.

## 2018-01-26 DIAGNOSIS — I951 Orthostatic hypotension: Secondary | ICD-10-CM | POA: Diagnosis not present

## 2018-01-26 DIAGNOSIS — R55 Syncope and collapse: Secondary | ICD-10-CM | POA: Diagnosis not present

## 2018-01-26 DIAGNOSIS — N179 Acute kidney failure, unspecified: Secondary | ICD-10-CM | POA: Diagnosis not present

## 2018-01-26 LAB — BASIC METABOLIC PANEL
ANION GAP: 11 (ref 5–15)
BUN: 21 mg/dL — ABNORMAL HIGH (ref 6–20)
CALCIUM: 8.8 mg/dL — AB (ref 8.9–10.3)
CO2: 27 mmol/L (ref 22–32)
Chloride: 104 mmol/L (ref 101–111)
Creatinine, Ser: 1.13 mg/dL (ref 0.61–1.24)
GFR calc non Af Amer: 57 mL/min — ABNORMAL LOW (ref >60)
Glucose, Bld: 99 mg/dL (ref 65–99)
Potassium: 4.1 mmol/L (ref 3.5–5.1)
Sodium: 142 mmol/L (ref 135–145)

## 2018-01-26 LAB — CBC
HCT: 34.5 % — ABNORMAL LOW (ref 39.0–52.0)
HEMOGLOBIN: 11 g/dL — AB (ref 13.0–17.0)
MCH: 28.7 pg (ref 26.0–34.0)
MCHC: 31.9 g/dL (ref 30.0–36.0)
MCV: 90.1 fL (ref 78.0–100.0)
Platelets: 134 10*3/uL — ABNORMAL LOW (ref 150–400)
RBC: 3.83 MIL/uL — AB (ref 4.22–5.81)
RDW: 13.6 % (ref 11.5–15.5)
WBC: 4.7 10*3/uL (ref 4.0–10.5)

## 2018-01-26 LAB — GLUCOSE, CAPILLARY
GLUCOSE-CAPILLARY: 99 mg/dL (ref 65–99)
Glucose-Capillary: 134 mg/dL — ABNORMAL HIGH (ref 65–99)
Glucose-Capillary: 128 mg/dL — ABNORMAL HIGH (ref 65–99)

## 2018-01-26 LAB — HEMOGLOBIN A1C
HEMOGLOBIN A1C: 6.8 % — AB (ref 4.8–5.6)
Mean Plasma Glucose: 148.46 mg/dL

## 2018-01-26 LAB — PROTIME-INR
INR: 2.35
PROTHROMBIN TIME: 25.5 s — AB (ref 11.4–15.2)

## 2018-01-26 MED ORDER — POLYVINYL ALCOHOL 1.4 % OP SOLN
1.0000 [drp] | Freq: Three times a day (TID) | OPHTHALMIC | Status: DC
  Administered 2018-01-26: 1 [drp] via OPHTHALMIC
  Filled 2018-01-26: qty 15

## 2018-01-26 MED ORDER — WARFARIN SODIUM 5 MG PO TABS
6.0000 mg | ORAL_TABLET | Freq: Once | ORAL | Status: AC
  Administered 2018-01-26: 6 mg via ORAL
  Filled 2018-01-26: qty 1

## 2018-01-26 NOTE — Progress Notes (Signed)
[facility] for Coumadin (home med) Indication: history of DVT  Allergies  Allergen Reactions  . Actos [Pioglitazone]     Allergic reaction  . Bactrim Other (See Comments)    Unknown   . Doxycycline     Caused black spots in his eyes  . Flagyl [Metronidazole Hcl]     unknown  . Gabapentin   . Lyrica [Pregabalin]     Increase appetite   . Motrin [Ibuprofen]   . Nsaids     Swelling    . Ultram [Tramadol Hcl]     Itching / rash     . Vioxx [Rofecoxib]     Swelling    . Celebrex [Celecoxib] Rash  . Celebrex [Celecoxib] Rash  . Morphine And Related Rash    Patient Measurements: Height: 5\' 9"  (175.3 cm) Weight: 218 lb 11.2 oz (99.2 kg) IBW/kg (Calculated) : 70.7  Vital Signs:    Labs: Recent Labs    01/25/18 1101 01/26/18 0518  HGB 12.3* 11.0*  HCT 38.6* 34.5*  PLT 156 134*  LABPROT 25.0* 25.5*  INR 2.29 2.35  CREATININE 1.64* 1.13  TROPONINI <0.03  --     Estimated Creatinine Clearance: 55.5 mL/min (by C-G formula based on SCr of 1.13 mg/dL).   Medical History: Past Medical History:  Diagnosis Date  . CAD (coronary artery disease)    (Left main normal, LAD 30-40% mid stenosis, diagonal 30-40% stenosis, circumflex 30-40% stenosis, right coronary artery 30-40% stenosis. 2003.)  . CHF (congestive heart failure) ([place])   . Chronic back pain   . Chronic headache   . Chronic kidney disease   . Chronic pain    right hip/groin, neck, back  . Diabetes mellitus   . Diabetic neuropathy ([place])   . Diabetic retinopathy    right eye   . DVT (deep venous thrombosis) ([place])   . DVT of leg (deep venous thrombosis) ([place]) 1999   RLE   . GERD (gastroesophageal reflux disease)   . Goiter    stable x many year - last endo exam 08/2010  . Hiatal hernia   . HTN (hypertension)   . Hyperlipidemia   . Hypertension   . Lumbar spondylosis   . Macular degeneration   . Osteopenia   . Peripheral vertigo   . Prostate cancer ([place])   .  Sleep apnea   . Sleep apnea    CPAP  . Thyroid nodule   . Type 2 diabetes mellitus (HCC)     Medications:  Medications Prior to Admission  Medication Sig Dispense Refill Last Dose  . acetaminophen (TYLENOL) 650 MG CR tablet Take 650 mg by mouth every 8 (eight) hours. Given at 8:00 am, 2:00 pm, and 8:00 pm.   01/25/2018 at Unknown time  . amLODipine (NORVASC) 2.5 MG tablet Take 2.5 mg by mouth daily.   01/25/2018 at Unknown time  . aspirin (ASPIRIN CHILDRENS) 81 MG chewable tablet Chew 81 mg by mouth daily.     01/25/2018 at Unknown time  . atorvastatin (LIPITOR) 20 MG tablet Take 20 mg by mouth daily at 6 PM.    01/24/2018 at Unknown time  . carboxymethylcellul-glycerin (OPTIVE) 0.5-0.9 % ophthalmic solution Place 1 drop into both eyes as needed for dry eyes.   unknown  . docusate sodium (STOOL SOFTENER) 100 MG capsule Take 100 mg by mouth 2 (two) times daily.    01/25/2018 at Unknown time  . donepezil (ARICEPT) 10 MG tablet Take 10 mg by  mouth at bedtime.   01/24/2018 at Unknown time  . escitalopram (LEXAPRO) 10 MG tablet Take 10 mg by mouth daily.   01/25/2018 at Unknown time  . fentaNYL (DURAGESIC - DOSED MCG/HR) 50 MCG/HR Place 1 patch (50 mcg total) onto the skin every 3 (three) days. 10 patch 0 01/24/2018 at Unknown time  . finasteride (PROSCAR) 5 MG tablet Take 1 tablet (5 mg total) by mouth daily. 90 tablet 1 01/25/2018 at Unknown time  . furosemide (LASIX) 40 MG tablet Take 1 tablet (40 mg total) by mouth daily. (Patient taking differently: Take 40 mg by mouth 2 (two) times daily. ) 90 tablet 1 01/25/2018 at Unknown time  . loratadine (CLARITIN) 10 MG tablet Take 10 mg by mouth daily as needed for allergies.   unknown  . losartan (COZAAR) 50 MG tablet Take 1 tablet (50 mg total) by mouth daily. 90 tablet 1 01/25/2018 at Unknown time  . magnesium hydroxide (MILK OF MAGNESIA) 400 MG/5ML suspension Take 30 mLs by mouth daily as needed for mild constipation.   01/12/2018  . Menthol, Topical  Analgesic, (BIOFREEZE EX) Apply 1 application topically daily as needed (back, side, and shoulder pain).   unknown  . metFORMIN (GLUCOPHAGE-XR) 500 MG 24 hr tablet Take 1 tablet (500 mg total) by mouth daily with breakfast. (Patient taking differently: Take 500 mg by mouth every evening. ) 180 tablet 1 01/24/2018 at Unknown time  . metoprolol succinate (TOPROL-XL) 25 MG 24 hr tablet Take 12.5 mg by mouth daily.   01/25/2018 at 0800  . omeprazole (PRILOSEC) 20 MG capsule Take 1 capsule (20 mg total) by mouth daily. (Patient taking differently: Take 20 mg by mouth 2 (two) times daily before a meal. ) 90 capsule 1 01/25/2018 at Unknown time  . oxycodone (OXY-IR) 5 MG capsule Take 5 mg by mouth every 8 (eight) hours as needed for pain.    01/24/2018 at Unknown time  . polyethylene glycol (MIRALAX / GLYCOLAX) packet Take 17 g by mouth 2 (two) times daily.   01/25/2018 at Unknown time  . terazosin (HYTRIN) 2 MG capsule Take 1 capsule (2 mg total) by mouth at bedtime. 90 capsule 1 01/24/2018 at Unknown time  . tiZANidine (ZANAFLEX) 4 MG tablet Take 4 mg by mouth 3 (three) times daily.   01/25/2018 at Unknown time  . warfarin (COUMADIN) 6 MG tablet Take 6 mg by mouth daily at 6 PM.   01/24/2018 at 1800  . zolpidem (AMBIEN) 5 MG tablet Take 0.5 tablets by mouth at bedtime.   01/24/2018 at Unknown time  . ACCU-CHEK AVIVA PLUS test strip CHECK BLOOD SUGAR 2 TO 3 TIMES A DAY AND AS NEEDED 100 each 4 Taking  . Misc. Devices (HUGO ROLLING WALKER [place]) MISC 1 each by Does not apply route daily. 1 each 0 Taking    Assessment: INR therapeutic on admission.  History of DVT.   Goal of Therapy:  INR 2-3 Monitor platelets by anticoagulation protocol: Yes   Plan:  Coumadin 6 mg today x 1 (home dose) INR daily  Margot Ables, PharmD Clinical Pharmacist 01/26/2018 9:32 AM

## 2018-01-26 NOTE — Discharge Summary (Signed)
Physician Discharge Summary  [name] HFW:[id number] DOB: 11-07-1930 DOA: 01/25/2018  PCP: [name], FNP  Admit date: 01/25/2018 Discharge date: 01/26/2018  Time spent: 45 minutes  Recommendations for Outpatient Follow-up:  -To be discharged back to skilled nursing facility today. -Please note that Lasix and Cozaar have been discontinued due to orthostasis and acute renal failure.  Discharge Diagnoses:  Principal Problem:   Orthostasis Active Problems:   ARF (acute renal failure) (HCC)   HTN (hypertension)   Diabetes mellitus, type 2 ([place])   Hyperlipidemia   Discharge Condition: Stable and improved  Filed Weights   01/25/18 1021 01/25/18 1531  Weight: 99.8 kg (220 lb) 99.2 kg (218 lb 11.2 oz)    History of present illness:  [name] is a 84 y.o. male history is obtained from EDP notes as patient is unaware as to why he is here.  It is reported that he was sitting on his wheelchair when SNF staff came into his room and they were unable to arouse him.  EMS was called, they found him to be hypotensive with a systolic blood pressure in the 70s and bradycardic.  He was transferred to the hospital.  By the time he arrived here he was fully awake.  He was found to be orthostatic by both pulse and blood pressure parameters.  Observation has been requested for fluids.    Hospital Course:   Syncopal event -I suspect this was due to to orthostasis. -He was significantly dehydrated on admission with acute renal failure. -Lasix and Cozaar will be discontinued at this time. -Was given IV fluids and has responded favorably.  Acute renal failure -Creatinine on admission was 1.64, down to 1.13 on discharge. -Lasix and ARB have been discontinued at this time  Hyperlipidemia -Continue statin  History of DVT -Continue Coumadin.  Type 2 diabetes -Continue metformin, has been well controlled while hospitalized.  Hypertension -Well-controlled despite  cessation of Cozaar.   Procedures:  None   Consultations:  None  Discharge Instructions  Discharge Instructions    Diet - low sodium heart healthy   Complete by:  As directed    Increase activity slowly   Complete by:  As directed      Allergies as of 01/26/2018      Reactions   Actos [pioglitazone]    Allergic reaction   Bactrim Other (See Comments)   Unknown    Doxycycline    Caused black spots in his eyes   Flagyl [metronidazole Hcl]    unknown   Gabapentin    Lyrica [pregabalin]    Increase appetite    Motrin [ibuprofen]    Nsaids    Swelling    Ultram [tramadol Hcl]    Itching / rash    Vioxx [rofecoxib]    Swelling    Celebrex [celecoxib] Rash   Celebrex [celecoxib] Rash   Morphine And Related Rash      Medication List    STOP taking these medications   furosemide 40 MG tablet Commonly known as:  LASIX   losartan 50 MG tablet Commonly known as:  COZAAR     TAKE these medications   ACCU-CHEK AVIVA PLUS test strip Generic drug:  glucose blood CHECK BLOOD SUGAR 2 TO 3 TIMES A DAY AND AS NEEDED   acetaminophen 650 MG CR tablet Commonly known as:  TYLENOL Take 650 mg by mouth every 8 (eight) hours. Given at 8:00 am, 2:00 pm, and 8:00 pm.   amLODipine 2.5  MG tablet Commonly known as:  NORVASC Take 2.5 mg by mouth daily.   ASPIRIN CHILDRENS 81 MG chewable tablet Generic drug:  aspirin Chew 81 mg by mouth daily.   atorvastatin 20 MG tablet Commonly known as:  LIPITOR Take 20 mg by mouth daily at 6 PM.   BIOFREEZE EX Apply 1 application topically daily as needed (back, side, and shoulder pain).   donepezil 10 MG tablet Commonly known as:  ARICEPT Take 10 mg by mouth at bedtime.   escitalopram 10 MG tablet Commonly known as:  LEXAPRO Take 10 mg by mouth daily.   fentaNYL 50 MCG/HR Commonly known as:  DURAGESIC - dosed mcg/hr Place 1 patch (50 mcg total) onto the skin every 3 (three) days.   finasteride 5 MG tablet Commonly known  as:  PROSCAR Take 1 tablet (5 mg total) by mouth daily.   HUGO ROLLING [place] 1 each by Does not apply route daily.   loratadine 10 MG tablet Commonly known as:  CLARITIN Take 10 mg by mouth daily as needed for allergies.   magnesium hydroxide 400 MG/5ML suspension Commonly known as:  MILK OF MAGNESIA Take 30 mLs by mouth daily as needed for mild constipation.   metFORMIN 500 MG 24 hr tablet Commonly known as:  GLUCOPHAGE-XR Take 1 tablet (500 mg total) by mouth daily with breakfast. What changed:  when to take this   metoprolol succinate 25 MG 24 hr tablet Commonly known as:  TOPROL-XL Take 12.5 mg by mouth daily.   omeprazole 20 MG capsule Commonly known as:  PRILOSEC Take 1 capsule (20 mg total) by mouth daily. What changed:  when to take this   OPTIVE 0.5-0.9 % ophthalmic solution Generic drug:  carboxymethylcellul-glycerin Place 1 drop into both eyes as needed for dry eyes.   oxycodone 5 MG capsule Commonly known as:  OXY-IR Take 5 mg by mouth every 8 (eight) hours as needed for pain.   polyethylene glycol packet Commonly known as:  MIRALAX / GLYCOLAX Take 17 g by mouth 2 (two) times daily.   STOOL SOFTENER 100 MG capsule Generic drug:  docusate sodium Take 100 mg by mouth 2 (two) times daily.   terazosin 2 MG capsule Commonly known as:  HYTRIN Take 1 capsule (2 mg total) by mouth at bedtime.   tiZANidine 4 MG tablet Commonly known as:  ZANAFLEX Take 4 mg by mouth 3 (three) times daily.   warfarin 6 MG tablet Commonly known as:  COUMADIN Take 6 mg by mouth daily at 6 PM.   zolpidem 5 MG tablet Commonly known as:  AMBIEN Take 0.5 tablets by mouth at bedtime.      Allergies  Allergen Reactions  . Actos [Pioglitazone]     Allergic reaction  . Bactrim Other (See Comments)    Unknown   . Doxycycline     Caused black spots in his eyes  . Flagyl [Metronidazole Hcl]     unknown  . Gabapentin   . Lyrica [Pregabalin]     Increase  appetite   . Motrin [Ibuprofen]   . Nsaids     Swelling    . Ultram [Tramadol Hcl]     Itching / rash     . Vioxx [Rofecoxib]     Swelling    . Celebrex [Celecoxib] Rash  . Celebrex [Celecoxib] Rash  . Morphine And Related Rash      The results of significant diagnostics from this hospitalization (including imaging, microbiology, ancillary and laboratory)  are listed below for reference.    Significant Diagnostic Studies: Dg Chest 1 View  Result Date: 01/25/2018 CLINICAL DATA:  Syncope.  Right hip pain. EXAM: CHEST 1 VIEW COMPARISON:  04/26/2016 FINDINGS: Heart size upper limits of normal. Chronic aortic atherosclerosis. The lungs are clear. The vascularity is normal. No visible effusion. No sign of bone injury. IMPRESSION: No active disease. Electronically Signed   By: Nelson Chimes M.D.   On: 01/25/2018 12:22   Ct Head Wo Contrast  Result Date: 01/25/2018 CLINICAL DATA:  83 year old male with a history of loss of consciousness EXAM: CT HEAD WITHOUT CONTRAST TECHNIQUE: Contiguous axial images were obtained from the base of the skull through the vertex without intravenous contrast. COMPARISON:  12/18/2016 FINDINGS: Brain: No acute intracranial hemorrhage. No midline shift or mass effect. Gray-white differentiation is relatively maintained. Senescent calcifications/mineralization of the bilateral basal ganglia. Mild periventricular hypodensity, unchanged from prior. Unremarkable configuration the ventricles. Vascular: Calcifications of the anterior circulation. Skull: No acute fracture.  No aggressive bony lesions. Sinuses/Orbits: No acute finding. Other: None. IMPRESSION: CT negative for acute intracranial abnormality. Changes of chronic microvascular ischemic disease. Electronically Signed   By: Corrie Mckusick D.O.   On: 01/25/2018 11:43   Dg Hip Unilat With Pelvis 2-3 Views Right  Result Date: 01/25/2018 CLINICAL DATA:  Syncope with right hip pain. EXAM: DG HIP (WITH OR WITHOUT PELVIS)  2-3V RIGHT COMPARISON:  None. FINDINGS: There is no evidence of hip fracture or dislocation. There is no evidence of arthropathy or other focal bone abnormality. IMPRESSION: Negative. Electronically Signed   By: Nelson Chimes M.D.   On: 01/25/2018 12:24    Microbiology: Recent Results (from the past 240 hour(s))  MRSA PCR Screening     Status: None   Collection Time: 01/25/18  4:51 PM  Result Value Ref Range Status   MRSA by PCR NEGATIVE NEGATIVE Final    Comment:        The GeneXpert MRSA Assay (FDA approved for NASAL specimens only), is one component of a comprehensive MRSA colonization surveillance program. It is not intended to diagnose MRSA infection nor to guide or monitor treatment for MRSA infections.      Labs: Basic Metabolic Panel: Recent Labs  Lab 01/25/18 1101 01/26/18 0518  NA 141 142  K 4.1 4.1  CL 102 104  CO2 30 27  GLUCOSE 130* 99  BUN 26* 21*  CREATININE 1.64* 1.13  CALCIUM 9.0 8.8*   Liver Function Tests: Recent Labs  Lab 01/25/18 1101  AST 22  ALT 15*  ALKPHOS 64  BILITOT 0.6  PROT 6.6  ALBUMIN 3.7   No results for input(s): LIPASE, AMYLASE in the last 168 hours. No results for input(s): AMMONIA in the last 168 hours. CBC: Recent Labs  Lab 01/25/18 1101 01/26/18 0518  WBC 6.4 4.7  NEUTROABS 2.7  --   HGB 12.3* 11.0*  HCT 38.6* 34.5*  MCV 91.3 90.1  PLT 156 134*   Cardiac Enzymes: Recent Labs  Lab 01/25/18 1101  TROPONINI <0.03   BNP: BNP (last 3 results) No results for input(s): BNP in the last 8760 hours.  ProBNP (last 3 results) No results for input(s): PROBNP in the last 8760 hours.  CBG: Recent Labs  Lab 01/25/18 2335 01/26/18 0722 01/26/18 1117 01/26/18 1618  GLUCAP 97 99 134* 128*       Signed:  [facility] Pager: [phone] 01/26/2018, 5:04 PM

## 2018-01-26 NOTE — Care Management Obs Status (Signed)
[facility] NOTIFICATION   Patient Details  Name: CHESNEY KLIMASZEWSKI MRN: [id number] Date of Birth: [birth year]   Medicare Observation Status Notification Given:  Yes    Sherald Barge, RN 01/26/2018, 10:49 AM

## 2018-01-26 NOTE — Clinical Social Work Note (Signed)
Clinical Social Work Assessment  Patient Details  Name: [name] MRN: [id number] Date of Birth: 1930-02-16  Date of referral:  01/26/18               Reason for consult:  Discharge Planning                Permission sought to share information with:    Permission granted to share information::     Name::        Agency::     Relationship::     Contact Information:     Housing/Transportation Living arrangements for the past 2 months:  [place] of Information:  Facility Patient Interpreter Needed:  None Criminal Activity/Legal Involvement Pertinent to Current Situation/Hospitalization:  No - Comment as needed Significant Relationships:  Adult Children Lives with:  Facility Resident Do you feel safe going back to the place where you live?  Yes Need for family participation in patient care:  Yes (Comment)  Care giving concerns: Pt is a long term care resident at [facility].   Social Worker assessment / plan: Pt is an 84 year old male here under observation from [facility]. Pt may be stable for return to the SNF today per MD. Damaris Schooner with Larene Beach at [facility] to update. Per Larene Beach, pt mostly uses a wheelchair at the SNF but he can transfer to chair, commode, bed. Pt may need some additional assistance when he returns due to orthostatic pressure changes. Pt's daughter is involved and supportive.   Employment status:  Retired Nurse, adult, Medicaid In [place] PT Recommendations:  Not assessed at this time Information / Referral to community resources:     Patient/Family's Response to care: Pt is accepting of care.  Patient/Family's Understanding of and Emotional Response to Diagnosis, Current Treatment, and Prognosis: Pt and his daughter appear to have a good understanding of diagnosis and treatment plan. Pt appears sad and emotional support is being offered.  Emotional Assessment Appearance:  Appears stated  age Attitude/Demeanor/Rapport:    Affect (typically observed):  Sad Orientation:  Oriented to Self, Oriented to Place, Oriented to Situation Alcohol / Substance use:  Not Applicable Psych involvement (Current and /or in the community):  No (Comment)  Discharge Needs  Concerns to be addressed:  Discharge Planning Concerns Readmission within the last 30 days:  No Current discharge risk:  None Barriers to Discharge:  No Barriers Identified   Shade Flood, LCSW 01/26/2018, 10:54 AM

## 2018-01-26 NOTE — Progress Notes (Signed)
Called report to [facility]. EMS to pick up patient at [facility]. Pt's IV removed and site intact. Sending to [place] in a folder Pt's AVS and Discharge summary per admission coordinator instructions.    Waiting on EMS.   Called and notified daughter of D/C.

## 2018-01-27 NOTE — Progress Notes (Signed)
Patient off unit via stretcher for transfer by EMS. Patient alert at this time. Information packet given to EMS personnel. Patient verbalizes understanding. No distress noted at this time. No complaints at this time

## 2018-01-28 LAB — URINE CULTURE: Culture: >=100000 — AB

## 2018-07-30 ENCOUNTER — Other Ambulatory Visit: Payer: Self-pay

## 2018-07-30 ENCOUNTER — Inpatient Hospital Stay (HOSPITAL_COMMUNITY)
Admission: EM | Admit: 2018-07-30 | Discharge: 2018-08-04 | DRG: 480 | Disposition: A | Discharge status: Skilled Nursing Facility | Payer: Medicare Other | Attending: Internal Medicine | Admitting: Internal Medicine

## 2018-07-30 ENCOUNTER — Encounter (HOSPITAL_COMMUNITY): Payer: Self-pay

## 2018-07-30 ENCOUNTER — Emergency Department (HOSPITAL_COMMUNITY): Payer: Medicare Other

## 2018-07-30 DIAGNOSIS — Z87891 Personal history of nicotine dependence: Secondary | ICD-10-CM | POA: Diagnosis not present

## 2018-07-30 DIAGNOSIS — E1142 Type 2 diabetes mellitus with diabetic polyneuropathy: Secondary | ICD-10-CM | POA: Diagnosis present

## 2018-07-30 DIAGNOSIS — Z7982 Long term (current) use of aspirin: Secondary | ICD-10-CM

## 2018-07-30 DIAGNOSIS — Y92122 Bedroom in nursing home as the place of occurrence of the external cause: Secondary | ICD-10-CM | POA: Diagnosis not present

## 2018-07-30 DIAGNOSIS — F0391 Unspecified dementia with behavioral disturbance: Secondary | ICD-10-CM | POA: Diagnosis not present

## 2018-07-30 DIAGNOSIS — F0390 Unspecified dementia without behavioral disturbance: Secondary | ICD-10-CM | POA: Diagnosis present

## 2018-07-30 DIAGNOSIS — M549 Dorsalgia, unspecified: Secondary | ICD-10-CM

## 2018-07-30 DIAGNOSIS — S7222XS Displaced subtrochanteric fracture of left femur, sequela: Secondary | ICD-10-CM | POA: Diagnosis not present

## 2018-07-30 DIAGNOSIS — R791 Abnormal coagulation profile: Secondary | ICD-10-CM | POA: Diagnosis present

## 2018-07-30 DIAGNOSIS — E785 Hyperlipidemia, unspecified: Secondary | ICD-10-CM | POA: Diagnosis present

## 2018-07-30 DIAGNOSIS — Z79899 Other long term (current) drug therapy: Secondary | ICD-10-CM

## 2018-07-30 DIAGNOSIS — S72142A Displaced intertrochanteric fracture of left femur, initial encounter for closed fracture: Secondary | ICD-10-CM | POA: Diagnosis present

## 2018-07-30 DIAGNOSIS — Z7984 Long term (current) use of oral hypoglycemic drugs: Secondary | ICD-10-CM | POA: Diagnosis not present

## 2018-07-30 DIAGNOSIS — Z9841 Cataract extraction status, right eye: Secondary | ICD-10-CM

## 2018-07-30 DIAGNOSIS — G9341 Metabolic encephalopathy: Secondary | ICD-10-CM | POA: Diagnosis present

## 2018-07-30 DIAGNOSIS — N400 Enlarged prostate without lower urinary tract symptoms: Secondary | ICD-10-CM | POA: Diagnosis not present

## 2018-07-30 DIAGNOSIS — Z01810 Encounter for preprocedural cardiovascular examination: Secondary | ICD-10-CM | POA: Diagnosis not present

## 2018-07-30 DIAGNOSIS — I480 Paroxysmal atrial fibrillation: Secondary | ICD-10-CM | POA: Diagnosis present

## 2018-07-30 DIAGNOSIS — G4733 Obstructive sleep apnea (adult) (pediatric): Secondary | ICD-10-CM | POA: Diagnosis present

## 2018-07-30 DIAGNOSIS — I129 Hypertensive chronic kidney disease with stage 1 through stage 4 chronic kidney disease, or unspecified chronic kidney disease: Secondary | ICD-10-CM | POA: Diagnosis present

## 2018-07-30 DIAGNOSIS — Z7901 Long term (current) use of anticoagulants: Secondary | ICD-10-CM

## 2018-07-30 DIAGNOSIS — M8580 Other specified disorders of bone density and structure, unspecified site: Secondary | ICD-10-CM | POA: Diagnosis present

## 2018-07-30 DIAGNOSIS — I2510 Atherosclerotic heart disease of native coronary artery without angina pectoris: Secondary | ICD-10-CM | POA: Diagnosis present

## 2018-07-30 DIAGNOSIS — I10 Essential (primary) hypertension: Secondary | ICD-10-CM | POA: Diagnosis not present

## 2018-07-30 DIAGNOSIS — I5030 Unspecified diastolic (congestive) heart failure: Secondary | ICD-10-CM | POA: Diagnosis not present

## 2018-07-30 DIAGNOSIS — N390 Urinary tract infection, site not specified: Secondary | ICD-10-CM | POA: Diagnosis present

## 2018-07-30 DIAGNOSIS — Z885 Allergy status to narcotic agent status: Secondary | ICD-10-CM

## 2018-07-30 DIAGNOSIS — F419 Anxiety disorder, unspecified: Secondary | ICD-10-CM | POA: Diagnosis present

## 2018-07-30 DIAGNOSIS — Z9842 Cataract extraction status, left eye: Secondary | ICD-10-CM

## 2018-07-30 DIAGNOSIS — S72355A Nondisplaced comminuted fracture of shaft of left femur, initial encounter for closed fracture: Secondary | ICD-10-CM | POA: Diagnosis not present

## 2018-07-30 DIAGNOSIS — E876 Hypokalemia: Secondary | ICD-10-CM | POA: Diagnosis not present

## 2018-07-30 DIAGNOSIS — Z881 Allergy status to other antibiotic agents status: Secondary | ICD-10-CM | POA: Diagnosis not present

## 2018-07-30 DIAGNOSIS — Z419 Encounter for procedure for purposes other than remedying health state, unspecified: Secondary | ICD-10-CM

## 2018-07-30 DIAGNOSIS — W0110XA Fall on same level from slipping, tripping and stumbling with subsequent striking against unspecified object, initial encounter: Secondary | ICD-10-CM | POA: Diagnosis not present

## 2018-07-30 DIAGNOSIS — F329 Major depressive disorder, single episode, unspecified: Secondary | ICD-10-CM | POA: Diagnosis present

## 2018-07-30 DIAGNOSIS — Z66 Do not resuscitate: Secondary | ICD-10-CM | POA: Diagnosis present

## 2018-07-30 DIAGNOSIS — Z86718 Personal history of other venous thrombosis and embolism: Secondary | ICD-10-CM

## 2018-07-30 DIAGNOSIS — Z888 Allergy status to other drugs, medicaments and biological substances status: Secondary | ICD-10-CM

## 2018-07-30 DIAGNOSIS — I4891 Unspecified atrial fibrillation: Secondary | ICD-10-CM

## 2018-07-30 DIAGNOSIS — N183 Chronic kidney disease, stage 3 (moderate): Secondary | ICD-10-CM | POA: Diagnosis present

## 2018-07-30 DIAGNOSIS — G8929 Other chronic pain: Secondary | ICD-10-CM

## 2018-07-30 DIAGNOSIS — S72002S Fracture of unspecified part of neck of left femur, sequela: Secondary | ICD-10-CM | POA: Diagnosis not present

## 2018-07-30 DIAGNOSIS — W19XXXA Unspecified fall, initial encounter: Secondary | ICD-10-CM

## 2018-07-30 HISTORY — DX: Unspecified dementia, unspecified severity, without behavioral disturbance, psychotic disturbance, mood disturbance, and anxiety: F03.90

## 2018-07-30 LAB — CBC WITH DIFFERENTIAL/PLATELET
BASOS ABS: 0 10*3/uL (ref 0.0–0.1)
BASOS PCT: 0 %
EOS ABS: 0 10*3/uL (ref 0.0–0.7)
EOS PCT: 0 %
HEMATOCRIT: 37.8 % — AB (ref 39.0–52.0)
Hemoglobin: 12.7 g/dL — ABNORMAL LOW (ref 13.0–17.0)
Lymphocytes Relative: 33 %
Lymphs Abs: 2.9 10*3/uL (ref 0.7–4.0)
MCH: 30.7 pg (ref 26.0–34.0)
MCHC: 33.6 g/dL (ref 30.0–36.0)
MCV: 91.3 fL (ref 78.0–100.0)
MONOS PCT: 5 %
Monocytes Absolute: 0.4 10*3/uL (ref 0.1–1.0)
Neutro Abs: 5.4 10*3/uL (ref 1.7–7.7)
Neutrophils Relative %: 62 %
Platelets: 121 10*3/uL — ABNORMAL LOW (ref 150–400)
RBC: 4.14 MIL/uL — ABNORMAL LOW (ref 4.22–5.81)
RDW: 14 % (ref 11.5–15.5)
WBC: 8.7 10*3/uL (ref 4.0–10.5)

## 2018-07-30 LAB — URINALYSIS, ROUTINE W REFLEX MICROSCOPIC
BILIRUBIN URINE: NEGATIVE
Glucose, UA: NEGATIVE mg/dL
Hgb urine dipstick: NEGATIVE
Ketones, ur: 5 mg/dL — AB
Nitrite: NEGATIVE
Protein, ur: 30 mg/dL — AB
SPECIFIC GRAVITY, URINE: 1.02 (ref 1.005–1.030)
pH: 6 (ref 5.0–8.0)

## 2018-07-30 LAB — COMPREHENSIVE METABOLIC PANEL
ALBUMIN: 3.8 g/dL (ref 3.5–5.0)
ALK PHOS: 70 U/L (ref 38–126)
ALT: 16 U/L (ref 0–44)
ANION GAP: 8 (ref 5–15)
AST: 25 U/L (ref 15–41)
BILIRUBIN TOTAL: 1.3 mg/dL — AB (ref 0.3–1.2)
BUN: 18 mg/dL (ref 8–23)
CO2: 30 mmol/L (ref 22–32)
CREATININE: 1.08 mg/dL (ref 0.61–1.24)
Calcium: 8.9 mg/dL (ref 8.9–10.3)
Chloride: 101 mmol/L (ref 98–111)
GFR calc Af Amer: >60 mL/min (ref >60)
GFR calc non Af Amer: >60 mL/min (ref >60)
GLUCOSE: 123 mg/dL — AB (ref 70–99)
Potassium: 3.4 mmol/L — ABNORMAL LOW (ref 3.5–5.1)
SODIUM: 139 mmol/L (ref 135–145)
TOTAL PROTEIN: 6.7 g/dL (ref 6.5–8.1)

## 2018-07-30 LAB — PROTIME-INR
INR: 1.83
PROTHROMBIN TIME: 21 s — AB (ref 11.4–15.2)

## 2018-07-30 MED ORDER — ACETAMINOPHEN 325 MG PO TABS
650.0000 mg | ORAL_TABLET | Freq: Four times a day (QID) | ORAL | Status: DC | PRN
  Administered 2018-07-30 – 2018-08-04 (×2): 650 mg via ORAL
  Filled 2018-07-30 (×2): qty 2

## 2018-07-30 MED ORDER — OXYCODONE HCL ER 10 MG PO T12A
10.0000 mg | EXTENDED_RELEASE_TABLET | Freq: Two times a day (BID) | ORAL | Status: DC
  Administered 2018-07-30 – 2018-07-31 (×3): 10 mg via ORAL
  Filled 2018-07-30 (×3): qty 1

## 2018-07-30 MED ORDER — ONDANSETRON HCL 4 MG/2ML IJ SOLN: 4.0000 mg | Freq: Four times a day (QID) | INTRAMUSCULAR | Status: DC | PRN

## 2018-07-30 MED ORDER — SODIUM CHLORIDE 0.9 % IV SOLN
1000.0000 mL | INTRAVENOUS | Status: DC
  Administered 2018-07-30: 1000 mL via INTRAVENOUS

## 2018-07-30 MED ORDER — MAGNESIUM SULFATE 2 GM/50ML IV SOLN
2.0000 g | Freq: Once | INTRAVENOUS | Status: AC
  Administered 2018-07-30: 2 g via INTRAVENOUS
  Filled 2018-07-30: qty 50

## 2018-07-30 MED ORDER — METOPROLOL TARTRATE 5 MG/5ML IV SOLN: 5.0000 mg | Freq: Once | INTRAVENOUS | Status: DC

## 2018-07-30 MED ORDER — HYDRALAZINE HCL 20 MG/ML IJ SOLN
10.0000 mg | Freq: Four times a day (QID) | INTRAMUSCULAR | Status: DC | PRN
  Administered 2018-07-31 – 2018-08-04 (×2): 10 mg via INTRAVENOUS
  Filled 2018-07-30 (×2): qty 1

## 2018-07-30 MED ORDER — POTASSIUM CHLORIDE CRYS ER 20 MEQ PO TBCR
40.0000 meq | EXTENDED_RELEASE_TABLET | Freq: Once | ORAL | Status: AC
  Administered 2018-07-30: 40 meq via ORAL
  Filled 2018-07-30: qty 2

## 2018-07-30 MED ORDER — FENTANYL CITRATE (PF) 100 MCG/2ML IJ SOLN
50.0000 ug | Freq: Once | INTRAMUSCULAR | Status: AC
  Administered 2018-07-30: 50 ug via INTRAVENOUS
  Filled 2018-07-30: qty 2

## 2018-07-30 MED ORDER — DEXTROSE 5 % IV SOLN
10.0000 mg | INTRAVENOUS | Status: AC
  Administered 2018-07-30: 10 mg via INTRAVENOUS
  Filled 2018-07-30: qty 1

## 2018-07-30 MED ORDER — ONDANSETRON HCL 4 MG/2ML IJ SOLN
4.0000 mg | Freq: Once | INTRAMUSCULAR | Status: AC
  Administered 2018-07-30: 4 mg via INTRAVENOUS
  Filled 2018-07-30: qty 2

## 2018-07-30 MED ORDER — HYDROMORPHONE HCL 1 MG/ML IJ SOLN: 0.5000 mg | INTRAMUSCULAR | Status: DC | PRN

## 2018-07-30 MED ORDER — SODIUM CHLORIDE 0.9 % IV BOLUS (SEPSIS)
500.0000 mL | Freq: Once | INTRAVENOUS | Status: AC
  Administered 2018-07-30: 500 mL via INTRAVENOUS

## 2018-07-30 MED ORDER — SODIUM CHLORIDE 0.9 % IV SOLN
1000.0000 mL | INTRAVENOUS | Status: DC
  Administered 2018-07-30 – 2018-08-01 (×6): 1000 mL via INTRAVENOUS

## 2018-07-30 MED ORDER — SENNA 8.6 MG PO TABS
1.0000 | ORAL_TABLET | Freq: Every day | ORAL | Status: DC
  Administered 2018-07-31 – 2018-08-04 (×4): 8.6 mg via ORAL
  Filled 2018-07-30 (×6): qty 1

## 2018-07-30 MED ORDER — POLYETHYLENE GLYCOL 3350 17 G PO PACK
17.0000 g | PACK | Freq: Every day | ORAL | Status: DC
  Administered 2018-07-30 – 2018-08-04 (×4): 17 g via ORAL
  Filled 2018-07-30 (×4): qty 1

## 2018-07-30 NOTE — ED Notes (Signed)
Patient BP low, metoprolol held. PA informed. 500 cc bolus running. PA in to talk with family.

## 2018-07-30 NOTE — ED Notes (Signed)
Scanner would not scan Potassium.

## 2018-07-30 NOTE — ED Provider Notes (Addendum)
[facility] EMERGENCY DEPARTMENT Provider Note   CSN: [id number] Arrival date & time: 07/30/18  4098     History   Chief Complaint Chief Complaint  Patient presents with  . Hip Pain    HPI [name] is a 84 y.o. male.  History obtained from patient, EMS, and nursing facility notes.  Patient is an 84 year old male who presents to the emergency department by EMS from the [place] facility following a fall and possible hip fracture.  The information obtained is that approximately 730 on last evening July 31, the patient states that he was carrying something in his room, he fell and injured the left side.  He says that he hit his head on something he is not sure what.  And he also injured his hip on the left side.  He was not able to stand.  The nursing facility reports that the patient had an x-ray and there was a question of fracture of the left hip.  At that time the family did not want the patient to go to the emergency department because they felt that he was not a surgical candidate.  It is of note that the patient is on Coumadin.  The patient has some chronic lung related issues, but denies any injury to his chest or lungs.  He holds the left lower extremity in a flexed position.  The history is provided by the patient.    Past Medical History:  Diagnosis Date  . CAD (coronary artery disease)    (Left main normal, LAD 30-40% mid stenosis, diagonal 30-40% stenosis, circumflex 30-40% stenosis, right coronary artery 30-40% stenosis. 2003.)  . CHF (congestive heart failure) ([place])   . Chronic back pain   . Chronic headache   . Chronic kidney disease   . Chronic pain    right hip/groin, neck, back  . Dementia   . Diabetes mellitus   . Diabetic neuropathy ([place])   . Diabetic retinopathy    right eye   . DVT (deep venous thrombosis) ([place])   . DVT of leg (deep venous thrombosis) ([place]) 1999   RLE   . GERD (gastroesophageal reflux disease)   . Goiter    stable x many year - last endo exam 08/2010  . Hiatal hernia   . HTN (hypertension)   . Hyperlipidemia   . Hypertension   . Lumbar spondylosis   . Macular degeneration   . Osteopenia   . Peripheral vertigo   . Prostate cancer ([place])   . Sleep apnea   . Sleep apnea    CPAP  . Thyroid nodule   . Type 2 diabetes mellitus [facility])     Patient Active Problem List   Diagnosis Date Noted  . Orthostasis 01/25/2018  . ARF (acute renal failure) ([place]) 01/25/2018  . Cervicalgia 04/26/2016  . Chronic anticoagulation 04/18/2016  . Foot pain, left 02/02/2016  . DVT (deep venous thrombosis), unspecified laterality 10/04/2015  . BMI 33.0-33.9,adult 07/25/2014  . Chest pain 07/12/2014  . Chronic back pain   . Osteopenia   . Decubitus ulcers 11/29/2013  . Tremors of nervous system 11/29/2013  . Macular degeneration of both eyes 07/13/2013  . Hyperlipidemia   . CAD (coronary artery disease)   . HTN (hypertension) 04/13/2011  . Hiatal hernia 04/13/2011  . Lumbar spondylolysis 04/13/2011  . Thyroid nodule 04/13/2011  . Sleep apnea 04/13/2011  . Diabetes mellitus, type 2 ([place]) 04/13/2011  . Prostate cancer ([place]) 04/13/2011  . Kidney disease 04/13/2011  .  Diabetic retinopathy ([place]) 04/13/2011  . Diabetic neuropathy ([place]) 04/13/2011    Past Surgical History:  Procedure Laterality Date  . APPENDECTOMY    . biopsy of right ear    . CATARACT EXTRACTION, BILATERAL    . CHOLECYSTECTOMY    . INGUINAL HERNIA REPAIR     bilateral  . INGUINAL HERNIA REPAIR     Bilateral  . KNEE ARTHROSCOPY     left   . TONSILECTOMY, ADENOIDECTOMY, BILATERAL MYRINGOTOMY AND TUBES    . TONSILLECTOMY AND ADENOIDECTOMY          Home Medications    Prior to Admission medications   Medication Sig Start Date End Date Taking? Authorizing Provider  ACCU-CHEK AVIVA PLUS test strip CHECK BLOOD SUGAR 2 TO 3 TIMES A DAY AND AS NEEDED 06/03/16   Timmothy Euler, MD  acetaminophen (TYLENOL) 650 MG CR tablet Take 650  mg by mouth every 8 (eight) hours. Given at 8:00 am, 2:00 pm, and 8:00 pm.    [provider]  amLODipine (NORVASC) 2.5 MG tablet Take 2.5 mg by mouth daily.    [provider]  aspirin (ASPIRIN CHILDRENS) 81 MG chewable tablet Chew 81 mg by mouth daily.      [provider]  atorvastatin (LIPITOR) 20 MG tablet Take 20 mg by mouth daily at 6 PM.     [provider]  carboxymethylcellul-glycerin (OPTIVE) 0.5-0.9 % ophthalmic solution Place 1 drop into both eyes as needed for dry eyes.    [provider]  docusate sodium (STOOL SOFTENER) 100 MG capsule Take 100 mg by mouth 2 (two) times daily.     [provider]  donepezil (ARICEPT) 10 MG tablet Take 10 mg by mouth at bedtime.    [provider]  escitalopram (LEXAPRO) 10 MG tablet Take 10 mg by mouth daily.    [provider]  fentaNYL (DURAGESIC - DOSED MCG/HR) 50 MCG/HR Place 1 patch (50 mcg total) onto the skin every 3 (three) days. 06/06/16   Timmothy Euler, MD  finasteride (PROSCAR) 5 MG tablet Take 1 tablet (5 mg total) by mouth daily. 12/06/15   Hassell Done, Mary-Margaret, FNP  loratadine (CLARITIN) 10 MG tablet Take 10 mg by mouth daily as needed for allergies.    [provider]  magnesium hydroxide (MILK OF MAGNESIA) 400 MG/5ML suspension Take 30 mLs by mouth daily as needed for mild constipation.    [provider]  Menthol, Topical Analgesic, (BIOFREEZE EX) Apply 1 application topically daily as needed (back, side, and shoulder pain).    [provider]  metFORMIN (GLUCOPHAGE-XR) 500 MG 24 hr tablet Take 1 tablet (500 mg total) by mouth daily with breakfast. Patient taking differently: Take 500 mg by mouth every evening.  12/06/15   Hassell Done, Mary-Margaret, FNP  metoprolol succinate (TOPROL-XL) 25 MG 24 hr tablet Take 12.5 mg by mouth daily.    [provider]  Misc. Devices (HUGO ROLLING WALKER ELITE) MISC 1 each by Does not apply route  daily. 08/04/15   Hassell Done, Mary-Margaret, FNP  omeprazole (PRILOSEC) 20 MG capsule Take 1 capsule (20 mg total) by mouth daily. Patient taking differently: Take 20 mg by mouth 2 (two) times daily before a meal.  12/06/15   Hassell Done, Mary-Margaret, FNP  oxycodone (OXY-IR) 5 MG capsule Take 5 mg by mouth every 8 (eight) hours as needed for pain.     [provider]  polyethylene glycol (MIRALAX / GLYCOLAX) packet Take 17 g by mouth 2 (  two) times daily.    [provider]  terazosin (HYTRIN) 2 MG capsule Take 1 capsule (2 mg total) by mouth at bedtime. 12/06/15   Hassell Done, Mary-Margaret, FNP  tiZANidine (ZANAFLEX) 4 MG tablet Take 4 mg by mouth 3 (three) times daily.    [provider]  warfarin (COUMADIN) 6 MG tablet Take 6 mg by mouth daily at 6 PM.    [provider]  zolpidem (AMBIEN) 5 MG tablet Take 0.5 tablets by mouth at bedtime. 01/13/18   [provider]    Family History Family History  Problem Relation Age of Onset  . Hyperlipidemia Mother   . Hypertension Mother   . Deep vein thrombosis Mother   . Pulmonary embolism Mother   . Diabetes Mother   . Prostate cancer Father   . Cancer Father 45  . Colon cancer Sister   . Depression Sister   . Dementia Sister   . Dementia Sister   . Scleroderma Brother     Social History Social History   Tobacco Use  . Smoking status: Former Smoker    Types: Cigarettes, Pipe, Cigars    Last attempt to quit: 11/08/2003    Years since quitting: 14.7  . Smokeless tobacco: Former Systems developer    Quit date: 11/08/2003  Substance Use Topics  . Alcohol use: No  . Drug use: No     Allergies   Actos [pioglitazone]; Bactrim; Doxycycline; Flagyl [metronidazole hcl]; Gabapentin; Lyrica [pregabalin]; Motrin [ibuprofen]; Nsaids; Ultram [tramadol hcl]; Vioxx [rofecoxib]; Celebrex [celecoxib]; Celebrex [celecoxib]; and Morphine and related   Review of Systems Review of Systems  Constitutional: Negative for activity  change.       All ROS Neg except as noted in HPI  HENT: Negative for nosebleeds.   Eyes: Negative for photophobia and discharge.  Respiratory: Positive for shortness of breath. Negative for cough and wheezing.   Cardiovascular: Negative for chest pain and palpitations.  Gastrointestinal: Negative for abdominal pain and blood in stool.  Genitourinary: Negative for dysuria, frequency and hematuria.  Musculoskeletal: Positive for arthralgias and back pain. Negative for neck pain.  Skin: Negative.   Neurological: Negative for dizziness, seizures and speech difficulty.  Psychiatric/Behavioral: Negative for confusion and hallucinations.     Physical Exam Updated Vital Signs BP (!) 165/94 (BP Location: Right Arm)   Pulse 79   Temp 97.7 F (36.5 C) (Oral)   Resp 20   Wt 98.9 kg (218 lb)   SpO2 90%   BMI 32.19 kg/m   Physical Exam  Constitutional: He is oriented to person, place, and time. He appears well-developed and well-nourished.  Non-toxic appearance.  HENT:  Head: Normocephalic. Head is with contusion. Head is without raccoon's eyes and without Battle's sign.    Right Ear: Tympanic membrane and external ear normal.  Left Ear: Tympanic membrane and external ear normal.  There is tenderness to the left forehead area.  No significant hematoma.  Negative Battle's sign.  Eyes: Pupils are equal, round, and reactive to light. EOM and lids are normal.  Neck: Normal range of motion. Neck supple. Carotid bruit is not present.  Cardiovascular: Regular rhythm, intact distal pulses and normal pulses.  Extrasystoles are present. Tachycardia present.  Murmur heard.  Systolic murmur is present with a grade of 2/6. Pulmonary/Chest: Breath sounds normal. No respiratory distress.  Coarse breath sounds noted.  Symmetrical rise and fall of the chest.  No chest wall or rib tenderness.  Abdominal: Soft. Bowel sounds are normal. There is no  tenderness. There is no guarding.  Musculoskeletal:        Left hip: He exhibits decreased range of motion, bony tenderness and deformity.       Legs: Dorsalis pedis is 2+.  Capillary refill is less than 2 seconds of the upper and lower extremities.  Radial pulses 2+.  Left hip is abducted and externally rotated.  Pain to any movement or palpation.  Lymphadenopathy:       Head (right side): No submandibular adenopathy present.       Head (left side): No submandibular adenopathy present.    He has no cervical adenopathy.  Neurological: He is alert and oriented to person, place, and time. He has normal strength. No cranial nerve deficit or sensory deficit.  Skin: Skin is warm and dry.  Psychiatric: He has a normal mood and affect. His speech is normal.  Nursing note and vitals reviewed.    ED Treatments / Results  Labs (all labs ordered are listed, but only abnormal results are displayed) Labs Reviewed - No data to display  EKG None  Radiology Dg Chest 1 View  Result Date: 07/30/2018 CLINICAL DATA:  84 year old male status post fall with proximal left femur fracture. EXAM: CHEST  1 VIEW COMPARISON:  01/25/2018 and earlier. FINDINGS: AP supine view at 1008 hours. Stable cardiomegaly and mediastinal contours. Visualized tracheal air column is within normal limits. Stable lung volumes. No pneumothorax, pulmonary edema, pleural effusion or confluent pulmonary opacity. IMPRESSION: Chronic cardiomegaly.  No acute cardiopulmonary abnormality. Electronically Signed   By: Genevie Ann M.D.   On: 07/30/2018 10:27   Dg Hip Unilat W Or Wo Pelvis 2-3 Views Left  Result Date: 07/30/2018 CLINICAL DATA:  84 year old male status post fall at 1930 hours yesterday. Left hip pain. EXAM: DG HIP (WITH OR WITHOUT PELVIS) 2-3V LEFT COMPARISON:  Pelvis and right hip series 01/25/2018. FINDINGS: Comminuted intertrochanteric fracture of the proximal left femur with mild varus angulation. Femoral heads remain normally located. Hip joint spaces remain normal. Grossly intact  proximal right femur. No acute pelvic fracture identified. Calcified aortoiliac atherosclerosis. Negative visible bowel gas pattern. IMPRESSION: Comminuted left femur intertrochanteric fracture with minimal varus angulation. Electronically Signed   By: Genevie Ann M.D.   On: 07/30/2018 10:26    Procedures Procedures (including critical care time) CRITICAL CARE Performed by: Lily Kocher Total critical care time: *35** minutes Critical care time was exclusive of separately billable procedures and treating other patients. Critical care was necessary to treat or prevent imminent or life-threatening deterioration. Critical care was time spent personally by me on the following activities: development of treatment plan with patient and/or surrogate as well as nursing, discussions with consultants, evaluation of patient's response to treatment, examination of patient, obtaining history from patient or surrogate, ordering and performing treatments and interventions, ordering and review of laboratory studies, ordering and review of radiographic studies, pulse oximetry and re-evaluation of patient's condition. Medications Ordered in ED Medications - No data to display   Initial Impression / Assessment and Plan / ED Course  I have reviewed the triage vital signs and the nursing notes.  Pertinent labs & imaging results that were available during my care of the patient were reviewed by me and considered in my medical decision making (see chart for details).       Final Clinical Impressions(s) / ED Diagnoses  MDM  Patient sustained a fall about 730 on last evening.  Patient states that he hit his head.  It is of note  that the patient is on Coumadin.  CT head scan and CT cervical spine pending.  The patient has a pulse oximetry of 90%.  He has a history of congestive heart failure and chronic lung problems.  The chest x-ray shows cardiomegaly, but no acute cardiopulmonary abnormality.  The patient  complains of pain of the left hip.  The left hip is externally rotated.  The x-ray of the left hip shows a comminuted left femoral intratrochanteric fracture with some angulation.  Patient is given IV pain medication.  Labs are being obtained.  Patient seen with me by Dr. Jeanell Sparrow. EKG returns showing  Afib with rapid ventricular response. Monitor showes pt in and out of this rhythm. B/p 137/99.  Case Discussed with cardiology - They recommend conservative management with 5mg  IV Metoprolol and improve his potassium. Pt will probably need transfer to the [facility] given the hip fracture, cardiac dysrhythmia with patient being in and out of atrial fibrillation with rapid ventricular response, chronic anticoagulation, and mild hyponatremia.  CAse discussed with [facility] and with Dr Erlinda Hong. Pt to be transferred to the [place].   Final diagnoses:  Displaced intertrochanteric fracture of left femur, initial encounter for closed fracture [facility])  Atrial fibrillation with rapid ventricular response [facility])    ED Discharge Orders    None       Lily Kocher, PA-C 07/30/18 1748    Pattricia Boss, MD 07/31/18 0717    Lily Kocher, PA-C 08/11/18 1650    Pattricia Boss, MD 08/13/18 [id number]

## 2018-07-30 NOTE — ED Notes (Signed)
fentanyl patch removed and wasted in sharps box, per vo Lily Kocher PA

## 2018-07-30 NOTE — ED Notes (Signed)
Pt transported to xray 

## 2018-07-30 NOTE — ED Triage Notes (Signed)
Pt brought over by EMS from [place] due left hip fx. EMS reports that he fell last night and xray confirmed fx left hip. Family refused to send last night because per EMS family says he is not a surgical candidate. Facility physician made decision to send

## 2018-07-30 NOTE — ED Notes (Signed)
Patient changed, linens changed. Patient cleaned and moisture barrier placed to bilat groin area.

## 2018-07-30 NOTE — Progress Notes (Signed)
I spoke with Forestine Na ED PA regarding this patient's hip fx.  Per report, the patient is on coumadin and has suspected afib with RVR.  I have asked for the patient to be transferred [facility] for surgical fixation of his left hip fx once he is clear for surgery.  If he is clear to have surgery by tomorrow, I will plan on doing this but if he needs a more extensive preop work up and optimization then I will have one of my partners do the surgery.  INR needs to be below 1.5 for surgery.  Azucena Cecil, MD [place] 3:08 PM

## 2018-07-30 NOTE — H&P (Signed)
History and Physical  Jacob ALEXSIS BRANSCOM YDX:[id number] DOB: January 14, 1930 DOA: 07/30/2018  Referring physician: Dr Marshell Levan  PCP: [name], [place]  Outpatient Specialists: None Patient coming from: SNF Chief Complaint: Fall  HPI: Acy [name] is a 84 y.o. male with medical history significant for dementia, coronary artery disease, RLE DVT on Coumadin, OSA, type 2 diabetes, diabetic polyneuropathy, hypertension, hyperlipidemia, who presented to [facility] ED post fall that occurred yesterday evening at [facility].  History is obtained from the patient's daughter at bedside.  Unable to obtain reliable history from the patient due to altered mental status.  Per the daughter, the patient had a fall after turning on the wrong side of the bed in his room at SNF, hitting his left hip and his head.  Was in his usual state of health prior to that.  Has had intermittent falls in the past 2 months.  ED Course: Upon presentation to the ED, patient is alert but confused in the setting of dementia. Per daughter, baseline mental status is intermittent confusion but not agitation.  CT head without contrast and CT cervical spine unremarkable for any acute findings.  Urinalysis with mild pyuria.  Chest x-ray unremarkable for any lobular infiltrates.  Vital signs significant for sinus tachycardia and intermittent A. fib RVR on monitor in the room.  Review of Systems: Review of systems as noted in the HPI. All other systems reviewed and are negative.   Past Medical History:  Diagnosis Date  . CAD (coronary artery disease)    (Left main normal, LAD 30-40% mid stenosis, diagonal 30-40% stenosis, circumflex 30-40% stenosis, right coronary artery 30-40% stenosis. 2003.)  . CHF (congestive heart failure) ([place])   . Chronic back pain   . Chronic headache   . Chronic kidney disease   . Chronic pain    right hip/groin, neck, back  . Dementia   . Diabetes mellitus   . Diabetic neuropathy ([place])   . Diabetic  retinopathy    right eye   . DVT (deep venous thrombosis) ([place])   . DVT of leg (deep venous thrombosis) ([place]) 1999   RLE   . GERD (gastroesophageal reflux disease)   . Goiter    stable x many year - last endo exam 08/2010  . Hiatal hernia   . HTN (hypertension)   . Hyperlipidemia   . Hypertension   . Lumbar spondylosis   . Macular degeneration   . Osteopenia   . Peripheral vertigo   . Prostate cancer ([place])   . Sleep apnea   . Sleep apnea    CPAP  . Thyroid nodule   . Type 2 diabetes mellitus ([place])    Past Surgical History:  Procedure Laterality Date  . APPENDECTOMY    . biopsy of right ear    . CATARACT EXTRACTION, BILATERAL    . CHOLECYSTECTOMY    . INGUINAL HERNIA REPAIR     bilateral  . INGUINAL HERNIA REPAIR     Bilateral  . KNEE ARTHROSCOPY     left   . TONSILECTOMY, ADENOIDECTOMY, BILATERAL MYRINGOTOMY AND TUBES    . TONSILLECTOMY AND ADENOIDECTOMY      Social History:  reports that he quit smoking about 14 years ago. His smoking use included cigarettes, pipe, and cigars. He quit smokeless tobacco use about 14 years ago. He reports that he does not drink alcohol or use drugs.   Allergies  Allergen Reactions  . Actos [Pioglitazone]     Allergic reaction  . Bactrim Other (  See Comments)    Unknown   . Doxycycline     Caused black spots in his eyes  . Flagyl [Metronidazole Hcl]     unknown  . Gabapentin   . Lyrica [Pregabalin]     Increase appetite   . Motrin [Ibuprofen]   . Nsaids     Swelling    . Ultram [Tramadol Hcl]     Itching / rash     . Vioxx [Rofecoxib]     Swelling    . Celebrex [Celecoxib] Rash  . Celebrex [Celecoxib] Rash  . Morphine And Related Rash    Family History  Problem Relation Age of Onset  . Hyperlipidemia Mother   . Hypertension Mother   . Deep vein thrombosis Mother   . Pulmonary embolism Mother   . Diabetes Mother   . Prostate cancer Father   . Cancer Father 41  . Colon cancer Sister   . Depression Sister   .  Dementia Sister   . Dementia Sister   . Scleroderma Brother      Prior to Admission medications   Medication Sig Start Date End Date Taking? Authorizing Provider  acetaminophen (TYLENOL) 650 MG CR tablet Take 650 mg by mouth every 8 (eight) hours. Given at 8:00 am, 2:00 pm, and 8:00 pm.   Yes [provider]  amLODipine (NORVASC) 2.5 MG tablet Take 2.5 mg by mouth daily.   Yes [provider]  aspirin (ASPIRIN CHILDRENS) 81 MG chewable tablet Chew 81 mg by mouth daily.     Yes [provider]  atorvastatin (LIPITOR) 20 MG tablet Take 20 mg by mouth daily at 6 PM.    Yes [provider]  carboxymethylcellul-glycerin (OPTIVE) 0.5-0.9 % ophthalmic solution Place 1 drop into both eyes as needed for dry eyes.   Yes [provider]  docusate sodium (STOOL SOFTENER) 100 MG capsule Take 100 mg by mouth 2 (two) times daily.    Yes [provider]  donepezil (ARICEPT) 10 MG tablet Take 10 mg by mouth at bedtime.   Yes [provider]  escitalopram (LEXAPRO) 10 MG tablet Take 10 mg by mouth daily.   Yes [provider]  fentaNYL (DURAGESIC - DOSED MCG/HR) 50 MCG/HR Place 1 patch (50 mcg total) onto the skin every 3 (three) days. 06/06/16  Yes Timmothy Euler, MD  finasteride (PROSCAR) 5 MG tablet Take 1 tablet (5 mg total) by mouth daily. 12/06/15  Yes Martin, Mary-Margaret, FNP  loratadine (CLARITIN) 10 MG tablet Take 10 mg by mouth daily as needed for allergies.   Yes [provider]  magnesium hydroxide (MILK OF MAGNESIA) 400 MG/5ML suspension Take 30 mLs by mouth daily as needed for mild constipation.   Yes [provider]  Melatonin 3 MG TABS Take 1 tablet by mouth at bedtime.   Yes [provider]  Menthol, Topical Analgesic, (BIOFREEZE EX) Apply 1 application topically daily as needed (back, side, and shoulder pain).   Yes [provider]  metFORMIN (GLUCOPHAGE-XR) 750 MG 24 hr tablet Take 750  mg by mouth every evening.   Yes [provider]  omeprazole (PRILOSEC) 20 MG capsule Take 1 capsule (20 mg total) by mouth daily. 12/06/15  Yes Martin, Mary-Margaret, FNP  oxycodone (OXY-IR) 5 MG capsule Take 5 mg by mouth every 8 (eight) hours as needed for pain.    Yes [provider]  Polyethyl Glycol-Propyl Glycol (SYSTANE) 0.4-0.3 % SOLN Place 1 drop into the right eye 4 (  four) times daily as needed.   Yes [provider]  polyethylene glycol (MIRALAX / GLYCOLAX) packet Take 17 g by mouth 2 (two) times daily.   Yes [provider]  tiZANidine (ZANAFLEX) 4 MG tablet Take 4 mg by mouth 3 (three) times daily.   Yes [provider]  warfarin (COUMADIN) 6 MG tablet Take 6 mg by mouth daily at 6 PM.   Yes [provider]  ACCU-CHEK AVIVA PLUS test strip CHECK BLOOD SUGAR 2 TO 3 TIMES A DAY AND AS NEEDED 06/03/16   Timmothy Euler, MD  metFORMIN (GLUCOPHAGE-XR) 500 MG 24 hr tablet Take 1 tablet (500 mg total) by mouth daily with breakfast. Patient not taking: Reported on 07/30/2018 12/06/15   [name], [place]. Devices (HUGO ROLLING WALKER ELITE) MISC 1 each by Does not apply route daily. 08/04/15   [name], FNP    Physical Exam: BP (!) 159/86   Pulse 72   Temp 97.7 F (36.5 C) (Oral)   Resp 18   Wt 98.9 kg (218 lb)   SpO2 (!) 87%   BMI 32.19 kg/m    . General: 84 y.o. year-old male well developed well nourished in no acute distress.  Alert but confused in the setting of dementia.   . Cardiovascular: Regular rate and rhythm with no rubs or gallops.  No thyromegaly or JVD noted.  No lower extremity edema. 2/4 pulses in all 4 extremities. Marland Kitchen Respiratory: Diffuse mild rales bilaterally with no wheezes.  Poor inspiratory effort. Abdomen: Soft nontender nondistended with normal bowel sounds x4 quadrants. . Muskuloskeletal: No cyanosis, clubbing or edema noted bilaterally. Left LE is externally rotated with  abduction. . Neuro: Limited due to difficulty following commands. Sensation intact. Non focal. . Skin: No ulcerative lesions noted or rashes . Psychiatry: Judgement and insight abnormal. Unable to assess mood due to altered mental status.          Labs on Admission:  Basic Metabolic Panel: Recent Labs  Lab 07/30/18 1120  NA 139  K 3.4*  CL 101  CO2 30  GLUCOSE 123*  BUN 18  CREATININE 1.08  CALCIUM 8.9   Liver Function Tests: Recent Labs  Lab 07/30/18 1120  AST 25  ALT 16  ALKPHOS 70  BILITOT 1.3*  PROT 6.7  ALBUMIN 3.8   No results for input(s): LIPASE, AMYLASE in the last 168 hours. No results for input(s): AMMONIA in the last 168 hours. CBC: Recent Labs  Lab 07/30/18 1120  WBC 8.7  NEUTROABS 5.4  HGB 12.7*  HCT 37.8*  MCV 91.3  PLT 121*   Cardiac Enzymes: No results for input(s): CKTOTAL, CKMB, CKMBINDEX, TROPONINI in the last 168 hours.  BNP (last 3 results) No results for input(s): BNP in the last 8760 hours.  ProBNP (last 3 results) No results for input(s): PROBNP in the last 8760 hours.  CBG: No results for input(s): GLUCAP in the last 168 hours.  Radiological Exams on Admission: Dg Chest 1 View  Result Date: 07/30/2018 CLINICAL DATA:  84 year old male status post fall with proximal left femur fracture. EXAM: CHEST  1 VIEW COMPARISON:  01/25/2018 and earlier. FINDINGS: AP supine view at 1008 hours. Stable cardiomegaly and mediastinal contours. Visualized tracheal air column is within normal limits. Stable lung volumes. No pneumothorax, pulmonary edema, pleural effusion or confluent pulmonary opacity. IMPRESSION: Chronic cardiomegaly.  No acute cardiopulmonary abnormality. Electronically Signed   By: Genevie Ann M.D.   On: 07/30/2018 10:27  Ct Head Wo Contrast  Result Date: 07/30/2018 CLINICAL DATA:  Fall last night broken hip. EXAM: CT HEAD WITHOUT CONTRAST CT CERVICAL SPINE WITHOUT CONTRAST TECHNIQUE: Multidetector CT imaging of the head and cervical  spine was performed following the standard protocol without intravenous contrast. Multiplanar CT image reconstructions of the cervical spine were also generated. COMPARISON:  CT head 01/25/2018 FINDINGS: CT HEAD FINDINGS Brain: Image quality degraded by motion. Multiple images repeated due to motion Moderate to advanced atrophy. Moderate chronic white matter changes. No acute infarct, hemorrhage, or mass. Negative for subdural hematoma. Vascular: Negative for hyperdense vessel Skull: Negative for fracture Sinuses/Orbits: Paranasal sinuses clear. Bilateral cataract surgery. Other: None CT CERVICAL SPINE FINDINGS Alignment: 2 mm anterolisthesis C3-4 unchanged from prior neck CT of 08/19/2016. Extensive facet degeneration at this level. Mild anterolisthesis at C6-7 also unchanged. Skull base and vertebrae: Negative for acute fracture. Negative for mass lesion. Soft tissues and spinal canal: Cystic nodule right lobe of the thyroid measuring approximately 23 x 38 mm on axial images. Coarse calcification along the superior margin of the nodule. No adenopathy in the neck. Disc levels: Marked facet degeneration at C3-4. Diffuse uncinate spurring with moderate foraminal encroachment bilaterally and mild spinal stenosis. Moderate foraminal narrowing due to spurring and C5-6 and C6-7. Mild foraminal narrowing bilaterally at C4-5. Upper chest: Negative Other: None IMPRESSION: Image quality degraded by motion Atrophy and chronic microvascular ischemia. No acute intracranial abnormality 1. Extensive cervical spine degenerative change without fracture. 2. Right thyroid cystic nodule 23 x 28 mm. Electronically Signed   By: Franchot Gallo M.D.   On: 07/30/2018 12:58   Ct Cervical Spine Wo Contrast  Result Date: 07/30/2018 CLINICAL DATA:  Fall last night broken hip. EXAM: CT HEAD WITHOUT CONTRAST CT CERVICAL SPINE WITHOUT CONTRAST TECHNIQUE: Multidetector CT imaging of the head and cervical spine was performed following the  standard protocol without intravenous contrast. Multiplanar CT image reconstructions of the cervical spine were also generated. COMPARISON:  CT head 01/25/2018 FINDINGS: CT HEAD FINDINGS Brain: Image quality degraded by motion. Multiple images repeated due to motion Moderate to advanced atrophy. Moderate chronic white matter changes. No acute infarct, hemorrhage, or mass. Negative for subdural hematoma. Vascular: Negative for hyperdense vessel Skull: Negative for fracture Sinuses/Orbits: Paranasal sinuses clear. Bilateral cataract surgery. Other: None CT CERVICAL SPINE FINDINGS Alignment: 2 mm anterolisthesis C3-4 unchanged from prior neck CT of 08/19/2016. Extensive facet degeneration at this level. Mild anterolisthesis at C6-7 also unchanged. Skull base and vertebrae: Negative for acute fracture. Negative for mass lesion. Soft tissues and spinal canal: Cystic nodule right lobe of the thyroid measuring approximately 23 x 38 mm on axial images. Coarse calcification along the superior margin of the nodule. No adenopathy in the neck. Disc levels: Marked facet degeneration at C3-4. Diffuse uncinate spurring with moderate foraminal encroachment bilaterally and mild spinal stenosis. Moderate foraminal narrowing due to spurring and C5-6 and C6-7. Mild foraminal narrowing bilaterally at C4-5. Upper chest: Negative Other: None IMPRESSION: Image quality degraded by motion Atrophy and chronic microvascular ischemia. No acute intracranial abnormality 1. Extensive cervical spine degenerative change without fracture. 2. Right thyroid cystic nodule 23 x 28 mm. Electronically Signed   By: Franchot Gallo M.D.   On: 07/30/2018 12:58   Dg Hip Unilat W Or Wo Pelvis 2-3 Views Left  Result Date: 07/30/2018 CLINICAL DATA:  84 year old male status post fall at 1930 hours yesterday. Left hip pain. EXAM: DG HIP (WITH OR WITHOUT PELVIS) 2-3V LEFT COMPARISON:  Pelvis and right  hip series 01/25/2018. FINDINGS: Comminuted intertrochanteric  fracture of the proximal left femur with mild varus angulation. Femoral heads remain normally located. Hip joint spaces remain normal. Grossly intact proximal right femur. No acute pelvic fracture identified. Calcified aortoiliac atherosclerosis. Negative visible bowel gas pattern. IMPRESSION: Comminuted left femur intertrochanteric fracture with minimal varus angulation. Electronically Signed   By: Genevie Ann M.D.   On: 07/30/2018 10:26    EKG: I independently viewed the EKG done and my findings are as followed: Independently reviewed twelve-lead EKG which revealed sinus tachycardia with a rate of 136.  Assessment/Plan Present on Admission: . Femur fracture, left ([place])  Active Problems:   Femur fracture, left (H[facility])  Left comminuted femur intertrochanteric fracture Orthopedic surgery consulted by ED physician Pain management in place N.p.o. Needs medical clearance for surgery 2D echo ordered-pending  Cardiology consulted by ED physician  Acute metabolic encephalopathy most likely secondary to acute illness Agitated during this visit Per the patient's daughter he is not at his baseline. At baseline mental status with intermittent confusion but no agitation. Reorient as needed Fall precautions May require a one-to-one sitter if persists to avoid self-harm  Sinus tachycardia with intermittent A. fib RVR Currently in sinus and rate is controlled Not on any AV nodal blockade medications Telemetry monitoring Obtain 2D echo to r/o any structural cardiac abnormality Cardiology consulted by ED physician  Accelerated hypertension in the setting of essential hypertension Suspect component of severe pain from left hip fracture Start IV hydralazine as needed 10 mg for systolic blood pressure greater than 791 or diastolic blood pressure greater than 105 Continue to monitor vital signs NPO  Hyperlipidemia When no longer n.p.o. consider restarting Lipitor  Chronic depression/anxiety Continue  Lexapro when no longer n.p.o.  BPH On finasteride Monitor urine output  Type 2 diabetes On metformin XL 500 mg daily Obtain A1c Start ISS  Hypokalemia Potassium 3.4 Repleted with p.o. KCl and 2 g IV magnesium once  History of RLE DVT on Coumadin INR 1.83 INR will need to be below 1.5 for surgery Repeat INR in the morning  Dementia Continue donepezil when no longer n.p.o.  Possible UTI Mild pyuria noted on urinalysis We will obtain urine culture Treat empirically with IV Rocephin DC IV antibiotics if less than 100,000 colonies in urine culture  Risks: High risk for decompensation due to acute left hip fracture, multiple comorbidities, and advanced age.    DVT prophylaxis: Elevated INR with prior Coumadin use  Code Status: DNR  Family Communication: Daughter at bedside and also daughter who is POA over the phone on 07/30/2018. Per POA, patient has an advanced directive and is DNR.  Disposition Plan: Admit to telemetry unit  Consults called: Orthopedic surgery and cardiology by ED physician  Admission status: Inpatient  The patient will require at least 2 midnights for further testings and treatment of present condition.    [name] MD [facility] Pager [phone]  If 7PM-7AM, please contact night-coverage [email or web address] Password [email or web address]  07/30/2018, 3:31 PM

## 2018-07-30 NOTE — ED Notes (Signed)
Dr Jeanell Sparrow notified of pt's elevated HR

## 2018-07-31 ENCOUNTER — Encounter (HOSPITAL_COMMUNITY)
Admission: EM | Disposition: A | Discharge status: Skilled Nursing Facility | Payer: Self-pay | Source: Home / Self Care | Attending: Internal Medicine

## 2018-07-31 ENCOUNTER — Encounter (HOSPITAL_COMMUNITY): Payer: Self-pay | Admitting: Certified Registered Nurse Anesthetist

## 2018-07-31 ENCOUNTER — Inpatient Hospital Stay (HOSPITAL_COMMUNITY): Payer: Medicare Other

## 2018-07-31 ENCOUNTER — Encounter (HOSPITAL_COMMUNITY): Payer: Self-pay | Admitting: Cardiology

## 2018-07-31 DIAGNOSIS — Z01810 Encounter for preprocedural cardiovascular examination: Secondary | ICD-10-CM

## 2018-07-31 DIAGNOSIS — I4891 Unspecified atrial fibrillation: Secondary | ICD-10-CM

## 2018-07-31 DIAGNOSIS — I5030 Unspecified diastolic (congestive) heart failure: Secondary | ICD-10-CM

## 2018-07-31 DIAGNOSIS — S7222XS Displaced subtrochanteric fracture of left femur, sequela: Secondary | ICD-10-CM

## 2018-07-31 LAB — BPAM FFP
BLOOD PRODUCT EXPIRATION DATE: 201908072359
ISSUE DATE / TIME: 201908020922
Unit Type and Rh: 6200

## 2018-07-31 LAB — ECHOCARDIOGRAM COMPLETE
Height: 68 in
Weight: 3488 oz

## 2018-07-31 LAB — SURGICAL PCR SCREEN
MRSA, PCR: NEGATIVE
Staphylococcus aureus: NEGATIVE

## 2018-07-31 LAB — PREPARE FRESH FROZEN PLASMA

## 2018-07-31 LAB — GLUCOSE, CAPILLARY: GLUCOSE-CAPILLARY: 113 mg/dL — AB (ref 70–99)

## 2018-07-31 LAB — PROTIME-INR
INR: 1.38
PROTHROMBIN TIME: 16.9 s — AB (ref 11.4–15.2)

## 2018-07-31 LAB — ABO/RH: ABO/RH(D): A POS

## 2018-07-31 SURGERY — FIXATION, FRACTURE, INTERTROCHANTERIC, WITH INTRAMEDULLARY ROD
Anesthesia: General | Laterality: Left

## 2018-07-31 MED ORDER — MELATONIN 3 MG PO TABS
1.0000 | ORAL_TABLET | Freq: Every day | ORAL | Status: DC
  Administered 2018-07-31 – 2018-08-03 (×4): 3 mg via ORAL
  Filled 2018-07-31 (×5): qty 1

## 2018-07-31 MED ORDER — SODIUM CHLORIDE 0.9 % IV SOLN
1000.0000 mg | INTRAVENOUS | Status: DC
  Filled 2018-07-31: qty 10

## 2018-07-31 MED ORDER — DOCUSATE SODIUM 100 MG PO CAPS
100.0000 mg | ORAL_CAPSULE | Freq: Two times a day (BID) | ORAL | Status: DC
  Administered 2018-08-02 – 2018-08-04 (×5): 100 mg via ORAL
  Filled 2018-07-31 (×8): qty 1

## 2018-07-31 MED ORDER — FINASTERIDE 5 MG PO TABS
5.0000 mg | ORAL_TABLET | Freq: Every day | ORAL | Status: DC
  Administered 2018-07-31 – 2018-08-04 (×5): 5 mg via ORAL
  Filled 2018-07-31 (×5): qty 1

## 2018-07-31 MED ORDER — CARBOXYMETHYLCELLUL-GLYCERIN 0.5-0.9 % OP SOLN: 1.0000 [drp] | OPHTHALMIC | Status: DC | PRN

## 2018-07-31 MED ORDER — PANTOPRAZOLE SODIUM 40 MG PO TBEC
40.0000 mg | DELAYED_RELEASE_TABLET | Freq: Every day | ORAL | Status: DC
  Administered 2018-07-31 – 2018-08-04 (×5): 40 mg via ORAL
  Filled 2018-07-31 (×5): qty 1

## 2018-07-31 MED ORDER — AMLODIPINE BESYLATE 2.5 MG PO TABS
2.5000 mg | ORAL_TABLET | Freq: Every day | ORAL | Status: DC
  Administered 2018-07-31 – 2018-08-04 (×5): 2.5 mg via ORAL
  Filled 2018-07-31 (×5): qty 1

## 2018-07-31 MED ORDER — SODIUM CHLORIDE 0.9% IV SOLUTION: Freq: Once | INTRAVENOUS | Status: DC

## 2018-07-31 MED ORDER — HYPROMELLOSE (GONIOSCOPIC) 2.5 % OP SOLN
1.0000 [drp] | OPHTHALMIC | Status: DC | PRN
Start: 2018-07-31 — End: 2018-08-04
  Filled 2018-07-31: qty 15

## 2018-07-31 MED ORDER — POVIDONE-IODINE 10 % EX SWAB
2.0000 "application " | Freq: Once | CUTANEOUS | Status: AC
  Administered 2018-07-31: 2 via TOPICAL

## 2018-07-31 MED ORDER — DONEPEZIL HCL 10 MG PO TABS
10.0000 mg | ORAL_TABLET | Freq: Every day | ORAL | Status: DC
  Administered 2018-07-31 – 2018-08-03 (×4): 10 mg via ORAL
  Filled 2018-07-31 (×4): qty 1

## 2018-07-31 MED ORDER — SODIUM CHLORIDE 0.9 % IV SOLN
1.0000 g | Freq: Every day | INTRAVENOUS | Status: DC
  Administered 2018-07-31 – 2018-08-04 (×4): 1 g via INTRAVENOUS
  Filled 2018-07-31 (×3): qty 10
  Filled 2018-07-31: qty 1
  Filled 2018-07-31 (×2): qty 10

## 2018-07-31 MED ORDER — CEFAZOLIN SODIUM-DEXTROSE 2-4 GM/100ML-% IV SOLN
2.0000 g | INTRAVENOUS | Status: AC
  Filled 2018-07-31 (×2): qty 100

## 2018-07-31 MED ORDER — ATORVASTATIN CALCIUM 20 MG PO TABS
20.0000 mg | ORAL_TABLET | Freq: Every day | ORAL | Status: DC
  Administered 2018-07-31 – 2018-08-04 (×3): 20 mg via ORAL
  Filled 2018-07-31 (×3): qty 1

## 2018-07-31 MED ORDER — GUAIFENESIN ER 600 MG PO TB12
1200.0000 mg | ORAL_TABLET | Freq: Two times a day (BID) | ORAL | Status: DC
  Administered 2018-07-31 – 2018-08-04 (×5): 1200 mg via ORAL
  Filled 2018-07-31 (×6): qty 2

## 2018-07-31 MED ORDER — VITAMIN K1 10 MG/ML IJ SOLN: 10.0000 mg | Freq: Once | INTRAVENOUS | Status: DC

## 2018-07-31 NOTE — Progress Notes (Signed)
Pt's daughter Ivin Booty) confirmed that Pt is DNR.

## 2018-07-31 NOTE — Consult Note (Addendum)
ORTHOPAEDIC CONSULTATION  REQUESTING PHYSICIAN: Elgergawy, Silver Huguenin, MD  Chief Complaint: Left intertroch hip fracture  HPI: [name] is a 84 y.o. male who presents with left hip fracture s/p mechanical fall PTA.  The patient endorses severe pain in the left hip, that does not radiate, grinding in quality, worse with any movement, better with immobilization.  Denies LOC/fever/chills/nausea/vomiting.  Walks with assistive devices (walker, cane, wheelchair).  Does not live independently.  Denies LOC, neck pain, abd pain.  Lives at SNF.  Has advanced dementia along with h/o DVT on coumadin, CAD, CHF.  Past Medical History:  Diagnosis Date  . CAD (coronary artery disease)    (Left main normal, LAD 30-40% mid stenosis, diagonal 30-40% stenosis, circumflex 30-40% stenosis, right coronary artery 30-40% stenosis. 2003.)  . CHF (congestive heart failure) ([place])   . Chronic back pain   . Chronic headache   . Chronic kidney disease   . Chronic pain    right hip/groin, neck, back  . Dementia   . Diabetes mellitus   . Diabetic neuropathy ([place])   . Diabetic retinopathy    right eye   . DVT (deep venous thrombosis) ([place])   . DVT of leg (deep venous thrombosis) ([place]) 1999   RLE   . GERD (gastroesophageal reflux disease)   . Goiter    stable x many year - last endo exam 08/2010  . Hiatal hernia   . HTN (hypertension)   . Hyperlipidemia   . Hypertension   . Lumbar spondylosis   . Macular degeneration   . Osteopenia   . Peripheral vertigo   . Prostate cancer ([place])   . Sleep apnea   . Sleep apnea    CPAP  . Thyroid nodule   . Type 2 diabetes mellitus ([place])    Past Surgical History:  Procedure Laterality Date  . APPENDECTOMY    . biopsy of right ear    . CATARACT EXTRACTION, BILATERAL    . CHOLECYSTECTOMY    . INGUINAL HERNIA REPAIR     bilateral  . INGUINAL HERNIA REPAIR     Bilateral  . KNEE ARTHROSCOPY     left   . TONSILECTOMY, ADENOIDECTOMY, BILATERAL MYRINGOTOMY  AND TUBES    . TONSILLECTOMY AND ADENOIDECTOMY     Social History   Socioeconomic History  . Marital status: Divorced    Spouse name: Not on file  . Number of children: 4  . Years of education: Not on file  . Highest education level: Not on file  Occupational History  . Occupation: Retired    Fish farm manager: RETIRED  Social Needs  . Financial resource strain: Not on file  . Food insecurity:    Worry: Not on file    Inability: Not on file  . Transportation needs:    Medical: Not on file    Non-medical: Not on file  Tobacco Use  . Smoking status: Former Smoker    Types: Cigarettes, Pipe, Cigars    Last attempt to quit: 11/08/2003    Years since quitting: 14.7  . Smokeless tobacco: Former Systems developer    Quit date: 11/08/2003  Substance and Sexual Activity  . Alcohol use: No  . Drug use: No  . Sexual activity: Never  Lifestyle  . Physical activity:    Days per week: Not on file    Minutes per session: Not on file  . Stress: Not on file  Relationships  . Social connections:    Talks on phone: Not on  file    Gets together: Not on file    Attends religious service: Not on file    Active member of club or organization: Not on file    Attends meetings of clubs or organizations: Not on file    Relationship status: Not on file  Other Topics Concern  . Not on file  Social History Narrative   ** Merged History Encounter **       Family History  Problem Relation Age of Onset  . Hyperlipidemia Mother   . Hypertension Mother   . Deep vein thrombosis Mother   . Pulmonary embolism Mother   . Diabetes Mother   . Prostate cancer Father   . Cancer Father 52  . Colon cancer Sister   . Depression Sister   . Dementia Sister   . Dementia Sister   . Scleroderma Brother    Allergies  Allergen Reactions  . Actos [Pioglitazone]     Allergic reaction  . Bactrim Other (See Comments)    Unknown   . Doxycycline     Caused black spots in his eyes  . Flagyl [Metronidazole Hcl]     unknown    . Gabapentin   . Lyrica [Pregabalin]     Increase appetite   . Motrin [Ibuprofen]   . Nsaids     Swelling    . Ultram [Tramadol Hcl]     Itching / rash     . Vioxx [Rofecoxib]     Swelling    . Celebrex [Celecoxib] Rash  . Celebrex [Celecoxib] Rash  . Morphine And Related Rash   Prior to Admission medications   Medication Sig Start Date End Date Taking? Authorizing Provider  acetaminophen (TYLENOL) 650 MG CR tablet Take 650 mg by mouth every 8 (eight) hours. Given at 8:00 am, 2:00 pm, and 8:00 pm.   Yes [provider]  amLODipine (NORVASC) 2.5 MG tablet Take 2.5 mg by mouth daily.   Yes [provider]  aspirin (ASPIRIN CHILDRENS) 81 MG chewable tablet Chew 81 mg by mouth daily.     Yes [provider]  atorvastatin (LIPITOR) 20 MG tablet Take 20 mg by mouth daily at 6 PM.    Yes [provider]  carboxymethylcellul-glycerin (OPTIVE) 0.5-0.9 % ophthalmic solution Place 1 drop into both eyes as needed for dry eyes.   Yes [provider]  docusate sodium (STOOL SOFTENER) 100 MG capsule Take 100 mg by mouth 2 (two) times daily.    Yes [provider]  donepezil (ARICEPT) 10 MG tablet Take 10 mg by mouth at bedtime.   Yes [provider]  escitalopram (LEXAPRO) 10 MG tablet Take 10 mg by mouth daily.   Yes [provider]  fentaNYL (DURAGESIC - DOSED MCG/HR) 50 MCG/HR Place 1 patch (50 mcg total) onto the skin every 3 (three) days. 06/06/16  Yes Timmothy Euler, MD  finasteride (PROSCAR) 5 MG tablet Take 1 tablet (5 mg total) by mouth daily. 12/06/15  Yes Martin, Mary-Margaret, FNP  loratadine (CLARITIN) 10 MG tablet Take 10 mg by mouth daily as needed for allergies.   Yes [provider]  magnesium hydroxide (MILK OF MAGNESIA) 400 MG/5ML suspension Take 30 mLs by mouth daily as needed for mild constipation.   Yes [provider]  Melatonin 3 MG TABS Take 1 tablet by mouth at bedtime.   Yes  [provider]  Menthol, Topical Analgesic, (BIOFREEZE EX) Apply 1 application topically daily as needed (back, side, and  shoulder pain).   Yes [provider]  metFORMIN (GLUCOPHAGE-XR) 750 MG 24 hr tablet Take 750 mg by mouth every evening.   Yes [provider]  omeprazole (PRILOSEC) 20 MG capsule Take 1 capsule (20 mg total) by mouth daily. 12/06/15  Yes Martin, Mary-Margaret, FNP  oxycodone (OXY-IR) 5 MG capsule Take 5 mg by mouth every 8 (eight) hours as needed for pain.    Yes [provider]  Polyethyl Glycol-Propyl Glycol (SYSTANE) 0.4-0.3 % SOLN Place 1 drop into the right eye 4 (four) times daily as needed.   Yes [provider]  polyethylene glycol (MIRALAX / GLYCOLAX) packet Take 17 g by mouth 2 (two) times daily.   Yes [provider]  tiZANidine (ZANAFLEX) 4 MG tablet Take 4 mg by mouth 3 (three) times daily.   Yes [provider]  warfarin (COUMADIN) 6 MG tablet Take 6 mg by mouth daily at 6 PM.   Yes [provider]  ACCU-CHEK AVIVA PLUS test strip CHECK BLOOD SUGAR 2 TO 3 TIMES A DAY AND AS NEEDED 06/03/16   Timmothy Euler, MD  metFORMIN (GLUCOPHAGE-XR) 500 MG 24 hr tablet Take 1 tablet (500 mg total) by mouth daily with breakfast. Patient not taking: Reported on 07/30/2018 12/06/15   [name], [place]. Devices (HUGO ROLLING WALKER ELITE) MISC 1 each by Does not apply route daily. 08/04/15   [name], FNP   Dg Chest 1 View  Result Date: 07/30/2018 CLINICAL DATA:  84 year old male status post fall with proximal left femur fracture. EXAM: CHEST  1 VIEW COMPARISON:  01/25/2018 and earlier. FINDINGS: AP supine view at 1008 hours. Stable cardiomegaly and mediastinal contours. Visualized tracheal air column is within normal limits. Stable lung volumes. No pneumothorax, pulmonary edema, pleural effusion or confluent pulmonary opacity. IMPRESSION: Chronic cardiomegaly.  No acute cardiopulmonary  abnormality. Electronically Signed   By: Genevie Ann M.D.   On: 07/30/2018 10:27   Ct Head Wo Contrast  Result Date: 07/30/2018 CLINICAL DATA:  Fall last night broken hip. EXAM: CT HEAD WITHOUT CONTRAST CT CERVICAL SPINE WITHOUT CONTRAST TECHNIQUE: Multidetector CT imaging of the head and cervical spine was performed following the standard protocol without intravenous contrast. Multiplanar CT image reconstructions of the cervical spine were also generated. COMPARISON:  CT head 01/25/2018 FINDINGS: CT HEAD FINDINGS Brain: Image quality degraded by motion. Multiple images repeated due to motion Moderate to advanced atrophy. Moderate chronic white matter changes. No acute infarct, hemorrhage, or mass. Negative for subdural hematoma. Vascular: Negative for hyperdense vessel Skull: Negative for fracture Sinuses/Orbits: Paranasal sinuses clear. Bilateral cataract surgery. Other: None CT CERVICAL SPINE FINDINGS Alignment: 2 mm anterolisthesis C3-4 unchanged from prior neck CT of 08/19/2016. Extensive facet degeneration at this level. Mild anterolisthesis at C6-7 also unchanged. Skull base and vertebrae: Negative for acute fracture. Negative for mass lesion. Soft tissues and spinal canal: Cystic nodule right lobe of the thyroid measuring approximately 23 x 38 mm on axial images. Coarse calcification along the superior margin of the nodule. No adenopathy in the neck. Disc levels: Marked facet degeneration at C3-4. Diffuse uncinate spurring with moderate foraminal encroachment bilaterally and mild spinal stenosis. Moderate foraminal narrowing due to spurring and C5-6 and C6-7. Mild foraminal narrowing bilaterally at C4-5. Upper chest: Negative Other: None IMPRESSION: Image quality degraded by motion Atrophy and chronic microvascular ischemia. No acute intracranial abnormality 1. Extensive cervical spine degenerative change without fracture. 2. Right thyroid cystic nodule 23 x 28 mm. Electronically Signed  By: Franchot Gallo  M.D.   On: 07/30/2018 12:58   Ct Cervical Spine Wo Contrast  Result Date: 07/30/2018 CLINICAL DATA:  Fall last night broken hip. EXAM: CT HEAD WITHOUT CONTRAST CT CERVICAL SPINE WITHOUT CONTRAST TECHNIQUE: Multidetector CT imaging of the head and cervical spine was performed following the standard protocol without intravenous contrast. Multiplanar CT image reconstructions of the cervical spine were also generated. COMPARISON:  CT head 01/25/2018 FINDINGS: CT HEAD FINDINGS Brain: Image quality degraded by motion. Multiple images repeated due to motion Moderate to advanced atrophy. Moderate chronic white matter changes. No acute infarct, hemorrhage, or mass. Negative for subdural hematoma. Vascular: Negative for hyperdense vessel Skull: Negative for fracture Sinuses/Orbits: Paranasal sinuses clear. Bilateral cataract surgery. Other: None CT CERVICAL SPINE FINDINGS Alignment: 2 mm anterolisthesis C3-4 unchanged from prior neck CT of 08/19/2016. Extensive facet degeneration at this level. Mild anterolisthesis at C6-7 also unchanged. Skull base and vertebrae: Negative for acute fracture. Negative for mass lesion. Soft tissues and spinal canal: Cystic nodule right lobe of the thyroid measuring approximately 23 x 38 mm on axial images. Coarse calcification along the superior margin of the nodule. No adenopathy in the neck. Disc levels: Marked facet degeneration at C3-4. Diffuse uncinate spurring with moderate foraminal encroachment bilaterally and mild spinal stenosis. Moderate foraminal narrowing due to spurring and C5-6 and C6-7. Mild foraminal narrowing bilaterally at C4-5. Upper chest: Negative Other: None IMPRESSION: Image quality degraded by motion Atrophy and chronic microvascular ischemia. No acute intracranial abnormality 1. Extensive cervical spine degenerative change without fracture. 2. Right thyroid cystic nodule 23 x 28 mm. Electronically Signed   By: Franchot Gallo M.D.   On: 07/30/2018 12:58   Dg Hip  Unilat W Or Wo Pelvis 2-3 Views Left  Result Date: 07/30/2018 CLINICAL DATA:  84 year old male status post fall at 1930 hours yesterday. Left hip pain. EXAM: DG HIP (WITH OR WITHOUT PELVIS) 2-3V LEFT COMPARISON:  Pelvis and right hip series 01/25/2018. FINDINGS: Comminuted intertrochanteric fracture of the proximal left femur with mild varus angulation. Femoral heads remain normally located. Hip joint spaces remain normal. Grossly intact proximal right femur. No acute pelvic fracture identified. Calcified aortoiliac atherosclerosis. Negative visible bowel gas pattern. IMPRESSION: Comminuted left femur intertrochanteric fracture with minimal varus angulation. Electronically Signed   By: Genevie Ann M.D.   On: 07/30/2018 10:26    All pertinent xrays, MRI, CT independently reviewed and interpreted  Positive ROS: All other systems have been reviewed and were otherwise negative with the exception of those mentioned in the HPI and as above.  Physical Exam: General: Alert, no acute distress Cardiovascular: No pedal edema Respiratory: No cyanosis, no use of accessory musculature GI: No organomegaly, abdomen is soft and non-tender Skin: No lesions in the area of chief complaint Neurologic: Sensation intact distally Psychiatric: Patient is competent for consent with normal mood and affect Lymphatic: No axillary or cervical lymphadenopathy  MUSCULOSKELETAL:  - pain with movement of the hip and extremity - skin intact - NVI distally - compartments soft  Assessment: Left intertroch hip fracture  Plan: - surgical fixation is recommended, patient and family are aware of r/b/a and wish to proceed - consent obtained - medical optimization per primary team - surgery is planned for today if he is clear - vit K and FFP ordered  Thank you for the consult and the opportunity to see Mr. [name]. Jacob Roux, MD [place] 7:18 AM

## 2018-07-31 NOTE — Progress Notes (Signed)
  Echocardiogram 2D Echocardiogram has been performed.  Johny Chess 07/31/2018, 10:26 AM

## 2018-07-31 NOTE — Plan of Care (Signed)

## 2018-07-31 NOTE — Consult Note (Signed)
CARDIOLOGY CONSULT NOTE  Patient [name] MRN: [id number] DOB/AGE: 82-Nov-1931 82 y.o.  Admit date: 07/30/2018 Primary Physician [name], FNP Primary Cardiologist   Minus Breeding, MD Chief Complaint  Fall Requesting    Dr. Waldron Labs  HPI:   We are called to see the patient for preop consult.  He has had a mechanical fall.  He is to have surgical repair of a left femur fracture.  The patient has a distant history of nonobstructive CAD on cath in 2003.  I last saw him in 2012.  He had a negative stress perfusion study in 2015.  On presentation he did have atrial fib/flutter with rapid ventricular rate and some ST depression in the anterior and latera leads. Echo this admission demonstrated a normal EF.  He does have mildly elevated pulmonary pressures.     Unfortunately the patient has severe dementia and is unable to answer any questions.  He is try to crawl out of the bed.  He seems to indicate that his leg hurts but he cannot answer any questions.    I spoke with his daughter Ivin Booty who identifies herself as HCPOA.  She says that he has been fairly clear mentally prior to this fall and hospitalization.  There have been no a recent cardiovascular complaints per his daughter   Past Medical History:  Diagnosis Date  . CAD (coronary artery disease)    (Left main normal, LAD 30-40% mid stenosis, diagonal 30-40% stenosis, circumflex 30-40% stenosis, right coronary artery 30-40% stenosis. 2003.)  . CHF (congestive heart failure) ([place])   . Chronic back pain   . Chronic headache   . Chronic kidney disease   . Chronic pain    right hip/groin, neck, back  . Dementia   . Diabetes mellitus   . Diabetic neuropathy ([place])   . Diabetic retinopathy    right eye   . DVT (deep venous thrombosis) ([place])   . DVT of leg (deep venous thrombosis) ([place]) 1999   RLE   . GERD (gastroesophageal reflux disease)   . Goiter    stable x many year - last endo exam 08/2010  . Hiatal  hernia   . HTN (hypertension)   . Hyperlipidemia   . Hypertension   . Lumbar spondylosis   . Macular degeneration   . Osteopenia   . Peripheral vertigo   . Prostate cancer ([place])   . Sleep apnea   . Sleep apnea    CPAP  . Thyroid nodule   . Type 2 diabetes mellitus ([place])     Past Surgical History:  Procedure Laterality Date  . APPENDECTOMY    . biopsy of right ear    . CATARACT EXTRACTION, BILATERAL    . CHOLECYSTECTOMY    . INGUINAL HERNIA REPAIR     bilateral  . INGUINAL HERNIA REPAIR     Bilateral  . KNEE ARTHROSCOPY     left   . TONSILECTOMY, ADENOIDECTOMY, BILATERAL MYRINGOTOMY AND TUBES    . TONSILLECTOMY AND ADENOIDECTOMY      Allergies  Allergen Reactions  . Actos [Pioglitazone]     Allergic reaction  . Bactrim Other (See Comments)    Unknown   . Doxycycline     Caused black spots in his eyes  . Flagyl [Metronidazole Hcl]     unknown  . Gabapentin   . Lyrica [Pregabalin]     Increase appetite   . Motrin [Ibuprofen]   . Nsaids     Swelling    .  Ultram [Tramadol Hcl]     Itching / rash     . Vioxx [Rofecoxib]     Swelling    . Celebrex [Celecoxib] Rash  . Celebrex [Celecoxib] Rash  . Morphine And Related Rash   Medications Prior to Admission  Medication Sig Dispense Refill Last Dose  . acetaminophen (TYLENOL) 650 MG CR tablet Take 650 mg by mouth every 8 (eight) hours. Given at 8:00 am, 2:00 pm, and 8:00 pm.   07/29/2018 at Unknown time  . amLODipine (NORVASC) 2.5 MG tablet Take 2.5 mg by mouth daily.   07/30/2018 at Unknown time  . aspirin (ASPIRIN CHILDRENS) 81 MG chewable tablet Chew 81 mg by mouth daily.     07/29/2018 at Unknown time  . atorvastatin (LIPITOR) 20 MG tablet Take 20 mg by mouth daily at 6 PM.    07/29/2018 at Unknown time  . carboxymethylcellul-glycerin (OPTIVE) 0.5-0.9 % ophthalmic solution Place 1 drop into both eyes as needed for dry eyes.   unknown  . docusate sodium (STOOL SOFTENER) 100 MG capsule Take 100 mg by mouth 2 (two)  times daily.    07/29/2018 at Unknown time  . donepezil (ARICEPT) 10 MG tablet Take 10 mg by mouth at bedtime.   07/29/2018 at Unknown time  . escitalopram (LEXAPRO) 10 MG tablet Take 10 mg by mouth daily.   07/29/2018 at Unknown time  . fentaNYL (DURAGESIC - DOSED MCG/HR) 50 MCG/HR Place 1 patch (50 mcg total) onto the skin every 3 (three) days. 10 patch 0 Past Week at Unknown time  . finasteride (PROSCAR) 5 MG tablet Take 1 tablet (5 mg total) by mouth daily. 90 tablet 1 07/29/2018 at Unknown time  . loratadine (CLARITIN) 10 MG tablet Take 10 mg by mouth daily as needed for allergies.   unknown  . magnesium hydroxide (MILK OF MAGNESIA) 400 MG/5ML suspension Take 30 mLs by mouth daily as needed for mild constipation.   01/12/2018  . Melatonin 3 MG TABS Take 1 tablet by mouth at bedtime.   07/29/2018 at Unknown time  . Menthol, Topical Analgesic, (BIOFREEZE EX) Apply 1 application topically daily as needed (back, side, and shoulder pain).   unknown  . metFORMIN (GLUCOPHAGE-XR) 750 MG 24 hr tablet Take 750 mg by mouth every evening.   07/29/2018 at Unknown time  . omeprazole (PRILOSEC) 20 MG capsule Take 1 capsule (20 mg total) by mouth daily. 90 capsule 1 07/30/2018 at Unknown time  . oxycodone (OXY-IR) 5 MG capsule Take 5 mg by mouth every 8 (eight) hours as needed for pain.    01/24/2018 at Unknown time  . Polyethyl Glycol-Propyl Glycol (SYSTANE) 0.4-0.3 % SOLN Place 1 drop into the right eye 4 (four) times daily as needed.   07/30/2018 at Unknown time  . polyethylene glycol (MIRALAX / GLYCOLAX) packet Take 17 g by mouth 2 (two) times daily.   07/29/2018 at Unknown time  . tiZANidine (ZANAFLEX) 4 MG tablet Take 4 mg by mouth 3 (three) times daily.   07/30/2018 at Unknown time  . warfarin (COUMADIN) 6 MG tablet Take 6 mg by mouth daily at 6 PM.   07/29/2018 at 1800  . ACCU-CHEK AVIVA PLUS test strip CHECK BLOOD SUGAR 2 TO 3 TIMES A DAY AND AS NEEDED 100 each 4 Taking  . metFORMIN (GLUCOPHAGE-XR) 500 MG 24 hr  tablet Take 1 tablet (500 mg total) by mouth daily with breakfast. (Patient not taking: Reported on 07/30/2018) 180 tablet 1 Not Taking at Unknown time  .  Misc. Devices (HUGO ROLLING WALKER [place]ITE) MISC 1 each by Does not apply route daily. 1 each 0 Taking   Family History  Problem Relation Age of Onset  . Hyperlipidemia Mother   . Hypertension Mother   . Deep vein thrombosis Mother   . Pulmonary embolism Mother   . Diabetes Mother   . Prostate cancer Father   . Cancer Father 71  . Colon cancer Sister   . Depression Sister   . Dementia Sister   . Dementia Sister   . Scleroderma Brother     Social History   Socioeconomic History  . Marital status: Divorced    Spouse name: Not on file  . Number of children: 4  . Years of education: Not on file  . Highest education level: Not on file  Occupational History  . Occupation: Retired    Fish farm manager: RETIRED  Social Needs  . Financial resource strain: Not on file  . Food insecurity:    Worry: Not on file    Inability: Not on file  . Transportation needs:    Medical: Not on file    Non-medical: Not on file  Tobacco Use  . Smoking status: Former Smoker    Types: Cigarettes, Pipe, Cigars    Last attempt to quit: 11/08/2003    Years since quitting: 14.7  . Smokeless tobacco: Former Systems developer    Quit date: 11/08/2003  Substance and Sexual Activity  . Alcohol use: No  . Drug use: No  . Sexual activity: Never  Lifestyle  . Physical activity:    Days per week: Not on file    Minutes per session: Not on file  . Stress: Not on file  Relationships  . Social connections:    Talks on phone: Not on file    Gets together: Not on file    Attends religious service: Not on file    Active member of club or organization: Not on file    Attends meetings of clubs or organizations: Not on file    Relationship status: Not on file  . Intimate partner violence:    Fear of current or ex partner: Not on file    Emotionally abused: Not on file     Physically abused: Not on file    Forced sexual activity: Not on file  Other Topics Concern  . Not on file  Social History Narrative   ** Merged History Encounter **         ROS:  Unable to obtain secondary   Physical Exam: Blood pressure (!) 154/119, pulse 70, temperature 97.7 F (36.5 C), temperature source Oral, resp. rate 18, height 5\' 8"  (1.727 m), weight 218 lb (98.9 kg), SpO2 94 %.  GENERAL:  Well appearing HEENT:  Pupils equal round and reactive, fundi not visualized, oral mucosa unremarkable NECK:  No jugular venous distention, waveform within normal limits, carotid upstroke brisk and symmetric, no bruits, no thyromegaly LYMPHATICS:  No cervical, inguinal adenopathy LUNGS:  Clear to auscultation bilaterally BACK:  No CVA tenderness CHEST:  Unremarkable HEART:  PMI not displaced or sustained,S1 and S2 within normal limits, no S3, no S4, no clicks, no rubs, no murmurs ABD:  Flat, positive bowel sounds normal in frequency in pitch, no bruits, no rebound, no guarding, no midline pulsatile mass, no hepatomegaly, no splenomegaly EXT:  2 plus pulses throughout, no edema, no cyanosis no clubbing SKIN:  No rashes no nodules NEURO:  Cranial nerves II through XII grossly intact, motor grossly intact throughout  PSYCH:  Cognitively intact, oriented to person place and time   Labs: Lab Results  Component Value Date   BUN 18 07/30/2018   Lab Results  Component Value Date   CREATININE 1.08 07/30/2018   Lab Results  Component Value Date   NA 139 07/30/2018   K 3.4 (L) 07/30/2018   CL 101 07/30/2018   CO2 30 07/30/2018   Lab Results  Component Value Date   TROPONINI <0.03 01/25/2018   Lab Results  Component Value Date   WBC 8.7 07/30/2018   HGB 12.7 (L) 07/30/2018   HCT 37.8 (L) 07/30/2018   MCV 91.3 07/30/2018   PLT 121 (L) 07/30/2018   Lab Results  Component Value Date   CHOL 163 12/06/2015   HDL 47 12/06/2015   LDLCALC 79 12/06/2015   TRIG 183 (H) 12/06/2015    CHOLHDL 3.5 12/06/2015   Lab Results  Component Value Date   ALT 16 07/30/2018   AST 25 07/30/2018   ALKPHOS 70 07/30/2018   BILITOT 1.3 (H) 07/30/2018   EKG:  Study Conclusions  - Left ventricle: The cavity size was normal. There was moderate   concentric hypertrophy. Systolic function was normal. The   estimated ejection fraction was in the range of 60% to 65%. Wall   motion was normal; there were no regional wall motion   abnormalities. There was an increased relative contribution of   atrial contraction to ventricular filling. Doppler parameters are   consistent with abnormal left ventricular relaxation (grade 1   diastolic dysfunction). - Aortic valve: Trileaflet; mildly thickened, mildly calcified   leaflets. - Mitral valve: There was mild regurgitation. - Pulmonary arteries: PA peak pressure: 50 mm Hg (S). - Inferior vena cava: The vessel was mildly dilated.     Radiology:   CXR: AP supine view at 1008 hours. Stable cardiomegaly and mediastinal contours. Visualized tracheal air column is within normal limits. Stable lung volumes. No pneumothorax, pulmonary edema, pleural effusion or confluent pulmonary opacity.  EKG:   Atrial fib with RVR, left axis, intervals WNL, anterior lateral ST depression consistent with ischemia.    ASSESSMENT AND PLAN:   PREOP:    The patient is high risk for any surgical procedure given his comorbid condition and current confusion.  No preop testing or change in therapy is going to alter this.  I had this conversation with his daughter.  We agree that surgery should proceed even if there is high risk for complications during or more like post procedure if this surgery will be his best chance for pain control and QOL.    ATRIAL FIB:   PAF.  Currently sinus.  Of note he has been on warfarin for a history of DVT although this was subtherapeutic on admission.   He should be back on anticoagulation post op.  We will follow and manage arrhythmias  that are likely to arise.    HTN:  The blood pressure is at target. No change in medications is indicated. We will continue with therapeutic lifestyle changes (TLC).  CAD:   Follow and manage clinically.      SignedMinus Breeding 07/31/2018, 4:35 PM

## 2018-07-31 NOTE — Progress Notes (Signed)
PROGRESS NOTE                                                                                                                                                                                                             Patient Demographics:    [name], is a 84 y.o. male, DOB - February 10, 1930, GUR:[id number]  Admit date - 07/30/2018   Admitting Physician [name], DO  Outpatient Primary MD for the patient is [name], [place]  LOS - 1   Chief Complaint  Patient presents with  . Hip Pain       Brief Narrative   84 y.o. male with medical history significant for dementia, coronary artery disease, RLE DVT on Coumadin, OSA, type 2 diabetes, diabetic polyneuropathy, hypertension, hyperlipidemia, who presented to [facility] ED post fall  at SNF, work-up significant for left femur fracture, as well was noted to have paroxysmal A. fib which is a new diagnosis.   Subjective:    [name] today denies any chest pain, shortness of breath, no nausea or vomiting.   Assessment  & Plan :    Principal Problem:   Displaced intertrochanteric fracture of left femur, initial encounter for closed fracture (HCC)   Left comminuted femur intertrochanteric fracture -Continue to mechanical fall, orthopedic consult greatly appreciated, plan for surgical repair today, patient does not appear to be in decompensated CHF, or malignant arrhythmia, 2D echo with no significant valvular disease or regional wall motion abnormality, it is with a preserved EF, at this point there is no further work-up indicated before proceeding for surgery. -Patient with paroxysmal A. fib, currently normal sinus rhythm this morning, if he is in another A. fib with RVR episode, he can be started on Cardizem drip for heart rate control perioperatively.  Acute metabolic encephalopathy most likely secondary to acute illness -Apparently patient was agitated and confused on  presentation, he does appear to be more calm and cooperative and coherent this morning.  Paroxysmal A. fib with RVR -Patient was noted to have intermittent episodes of paroxysmal A. fib during these episodes heart rate goes are controlled in the 130s and 140s, otherwise he is in normal sinus rhythm, continue with telemetry monitoring, 2D echo with no significant atrial disease, left atrium enlargement or regional wall motion abnormality. -We will consult cardiology regarding further recommendation -Warfarin for  history of DVT, likely will be transitioned to [facility] before discharge.   Hypertension -Uncontrolled, most likely due to pain, continue with PRN hydralazine. -Resume home dose amlodipine  Hyperlipidemia -Continue Lipitor  Chronic depression/anxiety - Continue Lexapro when no longer n.p.o.  BPH - On finasteride  Type 2 diabetes -Hold home dose metformin, continue with insulin sliding scale during hospital stay  Hypokalemia -Repleted  History of RLE DVT on Coumadin INR 1.83, received vitamin K and FFP per Ortho, reverse warfarin before surgery, it is 1.38 today, will be more so when it is safe to resume full anticoagulation, likely will start the [facility] for anticoagulation.  Dementia - Continue donepezil  UTI -Continue with Rocephin, follow urine culture    Code Status : DNR  Family Communication  : None at bedside  Disposition Plan  : will need SNF  Consults  :  Ortho  Procedures  : none  DVT Prophylaxis  :  On warfarin (on hold)  Lab Results  Component Value Date   PLT 121 (L) 07/30/2018    Antibiotics  :    Anti-infectives (From admission, onward)   Start     Dose/Rate Route Frequency Ordered Stop   07/31/18 1000  cefTRIAXone (ROCEPHIN) 1 g in sodium chloride 0.9 % 100 mL IVPB     1 g 200 mL/hr over 30 Minutes Intravenous Daily 07/31/18 0819     07/31/18 0945  ceFAZolin (ANCEF) IVPB 2g/100 mL premix     2 g 200 mL/hr over 30 Minutes  Intravenous On call to O.R. 07/31/18 0943 08/01/18 0559        Objective:   Vitals:   07/30/18 2123 07/31/18 0003 07/31/18 0537 07/31/18 0918  BP: (!) 178/87 (!) 178/87 (!) 165/96 (!) 154/119  Pulse: 78 78 76 70  Resp: 18 18 18    Temp: 97.8 F (36.6 C) 97.8 F (36.6 C)  97.7 F (36.5 C)  TempSrc: Oral Oral  Oral  SpO2: 98%  98% 94%  Weight:  98.9 kg (218 lb)    Height:  5\' 8"  (1.727 m)      Wt Readings from Last 3 Encounters:  07/31/18 98.9 kg (218 lb)  01/25/18 99.2 kg (218 lb 11.2 oz)  06/30/17 92.1 kg (203 lb)     Intake/Output Summary (Last 24 hours) at 07/31/2018 1145 Last data filed at 07/31/2018 0330 Gross per 24 hour  Intake 667.67 ml  Output -  Net 667.67 ml     Physical Exam  Awake Alert, oriented x1, more conversant and appropriate today  Symmetrical Chest wall movement, Good air movement bilaterally, CTAB RRR,No Gallops,Rubs or new Murmurs, No Parasternal Heave +ve B.Sounds, Abd Soft, No tenderness,No rebound - guarding or rigidity. No Cyanosis, Clubbing or edema, No new Rash or bruise      Data Review:    CBC Recent Labs  Lab 07/30/18 1120  WBC 8.7  HGB 12.7*  HCT 37.8*  PLT 121*  MCV 91.3  MCH 30.7  MCHC 33.6  RDW 14.0  LYMPHSABS 2.9  MONOABS 0.4  EOSABS 0.0  BASOSABS 0.0    Chemistries  Recent Labs  Lab 07/30/18 1120  NA 139  K 3.4*  CL 101  CO2 30  GLUCOSE 123*  BUN 18  CREATININE 1.08  CALCIUM 8.9  AST 25  ALT 16  ALKPHOS 70  BILITOT 1.3*   ------------------------------------------------------------------------------------------------------------------ No results for input(s): CHOL, HDL, LDLCALC, TRIG, CHOLHDL, LDLDIRECT in the last 72 hours.  Lab Results  Component Value Date  HGBA1C 6.8 (H) 01/25/2018   ------------------------------------------------------------------------------------------------------------------ No results for input(s): TSH, T4TOTAL, T3FREE, THYROIDAB in the last 72 hours.  Invalid  input(s): FREET3 ------------------------------------------------------------------------------------------------------------------ No results for input(s): VITAMINB12, FOLATE, FERRITIN, TIBC, IRON, RETICCTPCT in the last 72 hours.  Coagulation profile Recent Labs  Lab 07/30/18 1120 07/31/18 0748  INR 1.83 1.38    No results for input(s): DDIMER in the last 72 hours.  Cardiac Enzymes No results for input(s): CKMB, TROPONINI, MYOGLOBIN in the last 168 hours.  Invalid input(s): CK ------------------------------------------------------------------------------------------------------------------    Component Value Date/Time   BNP 141.1 (H) 09/19/2015 1355    Inpatient Medications  Scheduled Meds: . sodium chloride   Intravenous Once  . guaiFENesin  1,200 mg Oral BID  . metoprolol tartrate  5 mg Intravenous Once  . oxyCODONE  10 mg Oral Q12H  . polyethylene glycol  17 g Oral Daily  . senna  1 tablet Oral Daily   Continuous Infusions: . sodium chloride 1,000 mL (07/31/18 1112)  .  ceFAZolin (ANCEF) IV Stopped (07/31/18 1117)  . cefTRIAXone (ROCEPHIN)  IV 1 g (07/31/18 1118)  . tranexamic acid Stopped (07/31/18 1119)   PRN Meds:.acetaminophen, hydrALAZINE, HYDROmorphone (DILAUDID) injection, ondansetron (ZOFRAN) IV  Micro Results Recent Results (from the past 240 hour(s))  Surgical pcr screen     Status: None   Collection Time: 07/30/18 11:26 PM  Result Value Ref Range Status   MRSA, PCR NEGATIVE NEGATIVE Final   Staphylococcus aureus NEGATIVE NEGATIVE Final    Comment: (NOTE) The Xpert SA Assay (FDA approved for NASAL specimens in patients 42 years of age and older), is one component of a comprehensive surveillance program. It is not intended to diagnose infection nor to guide or monitor treatment. Performed at [facility], [place] [address], [place], [place] [address]     Radiology Reports Dg Chest 1 View  Result Date: 07/30/2018 CLINICAL DATA:  84 year old  male status post fall with proximal left femur fracture. EXAM: CHEST  1 VIEW COMPARISON:  01/25/2018 and earlier. FINDINGS: AP supine view at 1008 hours. Stable cardiomegaly and mediastinal contours. Visualized tracheal air column is within normal limits. Stable lung volumes. No pneumothorax, pulmonary edema, pleural effusion or confluent pulmonary opacity. IMPRESSION: Chronic cardiomegaly.  No acute cardiopulmonary abnormality. Electronically Signed   By: Genevie Ann M.D.   On: 07/30/2018 10:27   Ct Head Wo Contrast  Result Date: 07/30/2018 CLINICAL DATA:  Fall last night broken hip. EXAM: CT HEAD WITHOUT CONTRAST CT CERVICAL SPINE WITHOUT CONTRAST TECHNIQUE: Multidetector CT imaging of the head and cervical spine was performed following the standard protocol without intravenous contrast. Multiplanar CT image reconstructions of the cervical spine were also generated. COMPARISON:  CT head 01/25/2018 FINDINGS: CT HEAD FINDINGS Brain: Image quality degraded by motion. Multiple images repeated due to motion Moderate to advanced atrophy. Moderate chronic white matter changes. No acute infarct, hemorrhage, or mass. Negative for subdural hematoma. Vascular: Negative for hyperdense vessel Skull: Negative for fracture Sinuses/Orbits: Paranasal sinuses clear. Bilateral cataract surgery. Other: None CT CERVICAL SPINE FINDINGS Alignment: 2 mm anterolisthesis C3-4 unchanged from prior neck CT of 08/19/2016. Extensive facet degeneration at this level. Mild anterolisthesis at C6-7 also unchanged. Skull base and vertebrae: Negative for acute fracture. Negative for mass lesion. Soft tissues and spinal canal: Cystic nodule right lobe of the thyroid measuring approximately 23 x 38 mm on axial images. Coarse calcification along the superior margin of the nodule. No adenopathy in the neck. Disc levels: Marked facet degeneration at  C3-4. Diffuse uncinate spurring with moderate foraminal encroachment bilaterally and mild spinal stenosis.  Moderate foraminal narrowing due to spurring and C5-6 and C6-7. Mild foraminal narrowing bilaterally at C4-5. Upper chest: Negative Other: None IMPRESSION: Image quality degraded by motion Atrophy and chronic microvascular ischemia. No acute intracranial abnormality 1. Extensive cervical spine degenerative change without fracture. 2. Right thyroid cystic nodule 23 x 28 mm. Electronically Signed   By: Franchot Gallo M.D.   On: 07/30/2018 12:58   Ct Cervical Spine Wo Contrast  Result Date: 07/30/2018 CLINICAL DATA:  Fall last night broken hip. EXAM: CT HEAD WITHOUT CONTRAST CT CERVICAL SPINE WITHOUT CONTRAST TECHNIQUE: Multidetector CT imaging of the head and cervical spine was performed following the standard protocol without intravenous contrast. Multiplanar CT image reconstructions of the cervical spine were also generated. COMPARISON:  CT head 01/25/2018 FINDINGS: CT HEAD FINDINGS Brain: Image quality degraded by motion. Multiple images repeated due to motion Moderate to advanced atrophy. Moderate chronic white matter changes. No acute infarct, hemorrhage, or mass. Negative for subdural hematoma. Vascular: Negative for hyperdense vessel Skull: Negative for fracture Sinuses/Orbits: Paranasal sinuses clear. Bilateral cataract surgery. Other: None CT CERVICAL SPINE FINDINGS Alignment: 2 mm anterolisthesis C3-4 unchanged from prior neck CT of 08/19/2016. Extensive facet degeneration at this level. Mild anterolisthesis at C6-7 also unchanged. Skull base and vertebrae: Negative for acute fracture. Negative for mass lesion. Soft tissues and spinal canal: Cystic nodule right lobe of the thyroid measuring approximately 23 x 38 mm on axial images. Coarse calcification along the superior margin of the nodule. No adenopathy in the neck. Disc levels: Marked facet degeneration at C3-4. Diffuse uncinate spurring with moderate foraminal encroachment bilaterally and mild spinal stenosis. Moderate foraminal narrowing due to  spurring and C5-6 and C6-7. Mild foraminal narrowing bilaterally at C4-5. Upper chest: Negative Other: None IMPRESSION: Image quality degraded by motion Atrophy and chronic microvascular ischemia. No acute intracranial abnormality 1. Extensive cervical spine degenerative change without fracture. 2. Right thyroid cystic nodule 23 x 28 mm. Electronically Signed   By: Franchot Gallo M.D.   On: 07/30/2018 12:58   Dg Hip Unilat W Or Wo Pelvis 2-3 Views Left  Result Date: 07/30/2018 CLINICAL DATA:  84 year old male status post fall at 1930 hours yesterday. Left hip pain. EXAM: DG HIP (WITH OR WITHOUT PELVIS) 2-3V LEFT COMPARISON:  Pelvis and right hip series 01/25/2018. FINDINGS: Comminuted intertrochanteric fracture of the proximal left femur with mild varus angulation. Femoral heads remain normally located. Hip joint spaces remain normal. Grossly intact proximal right femur. No acute pelvic fracture identified. Calcified aortoiliac atherosclerosis. Negative visible bowel gas pattern. IMPRESSION: Comminuted left femur intertrochanteric fracture with minimal varus angulation. Electronically Signed   By: Genevie Ann M.D.   On: 07/30/2018 10:26     Phillips Climes M.D on 07/31/2018 at 11:45 AM  Between 7am to 7pm - Pager - [phone]  After 7pm go to [email or web address] - password [facility]  [facility] -  Office  [phone]

## 2018-07-31 NOTE — Progress Notes (Signed)
Patient unable to use CPAP.Will monitor pt as needed.

## 2018-07-31 NOTE — Progress Notes (Signed)
Patient's surgery has been postponed to tomorrow pending formal cardiology consult.  Family updated.  NPO after midnight.

## 2018-07-31 NOTE — Progress Notes (Signed)
Nutrition Brief Note  Patient identified on the Malnutrition Screening Tool (MST) Report.  Wt Readings from Last 15 Encounters:  07/31/18 218 lb (98.9 kg)  01/25/18 218 lb 11.2 oz (99.2 kg)  06/30/17 203 lb (92.1 kg)  06/06/16 231 lb 6.4 oz (105 kg)  05/14/16 234 lb (106.1 kg)  04/26/16 234 lb 9.6 oz (106.4 kg)  04/18/16 236 lb 9.6 oz (107.3 kg)  03/11/16 239 lb 9.6 oz (108.7 kg)  02/02/16 238 lb (108 kg)  01/30/16 237 lb 9.6 oz (107.8 kg)  12/27/15 240 lb (108.9 kg)  12/06/15 236 lb (107 kg)  10/03/15 237 lb 9.6 oz (107.8 kg)  09/13/15 235 lb (106.6 kg)  08/04/15 234 lb (106.1 kg)    Body mass index is 33.15 kg/m. Patient meets criteria for Obesity Class I based on current BMI.   Current diet order is NPO for procedure. Family reports he was eating well PTA. Labs and medications reviewed.   No nutrition interventions warranted at this time. If nutrition issues arise, please consult RD.   Arthur Holms, RD, LDN Pager #: [phone] After-Hours Pager #: [phone]

## 2018-07-31 NOTE — Plan of Care (Signed)

## 2018-07-31 NOTE — Clinical Social Work Note (Signed)
Clinical Social Work Assessment  Patient Details  Name: [name] MRN: [id number] Date of Birth: 1930-11-22  Date of referral:  07/31/18               Reason for consult:  Facility Placement                Permission sought to share information with:  Facility Art therapist granted to share information::  Yes, Release of Information Signed  Name::     Beth  Agency::  [employer]  Relationship::  Daughter  Contact Information:     Housing/Transportation Living arrangements for the past 2 months:  [place] of Information:  Adult Children Patient Interpreter Needed:  None Criminal Activity/Legal Involvement Pertinent to Current Situation/Hospitalization:  No - Comment as needed Significant Relationships:  Adult Children Lives with:  Facility Resident Do you feel safe going back to the place where you live?  Yes Need for family participation in patient care:  No (Coment)  Care giving concerns: Pt is only alert to self. CSW spoke with pt's daughter Eustaquio Maize via telephone.   Social Worker assessment / plan:  CSW spoke with pt's daughter via telephone. Pt's daughter confirmed pt is a long term resident at [facility]. Pt's daughter confirmed pt will return at d/c. CSW to follow up with facility.  Employment status:  Retired Nurse, adult PT Recommendations:  [place] / Referral to community resources:  [place]  Patient/Family's Response to care:  Pt's daughter verbalized understanding of CSW role and expressed appreciation for support. Pt's daughter denies any concern regarding pt care at this time.   Patient/Family's Understanding of and Emotional Response to Diagnosis, Current Treatment, and Prognosis:  Pt's daughter understanding and realistic regarding pt's physical limitations. Pt's daughter understands the need for pt to return to SNF at d/c. Pt's daughter  denies any concern regarding pt's treatment plan at this time. CSW will continue to provide support and facilitate d/c needs.   Emotional Assessment Appearance:  Appears stated age Attitude/Demeanor/Rapport:  Unable to Assess Affect (typically observed):  Unable to Assess Orientation:  Oriented to Self Alcohol / Substance use:  Not Applicable Psych involvement (Current and /or in the community):  No (Comment)  Discharge Needs  Concerns to be addressed:  Basic Needs, Care Coordination Readmission within the last 30 days:  No Current discharge risk:  Dependent with Mobility Barriers to Discharge:  Continued Medical Work up   [employer], LCSW 07/31/2018, 2:07 PM

## 2018-08-01 ENCOUNTER — Inpatient Hospital Stay (HOSPITAL_COMMUNITY): Payer: Medicare Other

## 2018-08-01 ENCOUNTER — Inpatient Hospital Stay (HOSPITAL_COMMUNITY): Payer: Medicare Other | Admitting: Certified Registered"

## 2018-08-01 ENCOUNTER — Encounter (HOSPITAL_COMMUNITY)
Admission: EM | Disposition: A | Discharge status: Skilled Nursing Facility | Payer: Self-pay | Source: Home / Self Care | Attending: Internal Medicine

## 2018-08-01 DIAGNOSIS — S72002S Fracture of unspecified part of neck of left femur, sequela: Secondary | ICD-10-CM

## 2018-08-01 HISTORY — PX: INTRAMEDULLARY (IM) NAIL INTERTROCHANTERIC: SHX5875

## 2018-08-01 LAB — BASIC METABOLIC PANEL
Anion gap: 10 (ref 5–15)
BUN: 22 mg/dL (ref 8–23)
CHLORIDE: 105 mmol/L (ref 98–111)
CO2: 25 mmol/L (ref 22–32)
CREATININE: 1.16 mg/dL (ref 0.61–1.24)
Calcium: 8.6 mg/dL — ABNORMAL LOW (ref 8.9–10.3)
GFR calc non Af Amer: 56 mL/min — ABNORMAL LOW (ref >60)
Glucose, Bld: 115 mg/dL — ABNORMAL HIGH (ref 70–99)
POTASSIUM: 3.9 mmol/L (ref 3.5–5.1)
Sodium: 140 mmol/L (ref 135–145)

## 2018-08-01 LAB — GLUCOSE, CAPILLARY
Glucose-Capillary: 108 mg/dL — ABNORMAL HIGH (ref 70–99)
Glucose-Capillary: 126 mg/dL — ABNORMAL HIGH (ref 70–99)

## 2018-08-01 LAB — CBC
HEMATOCRIT: 39.1 % (ref 39.0–52.0)
HEMOGLOBIN: 12.7 g/dL — AB (ref 13.0–17.0)
MCH: 30.2 pg (ref 26.0–34.0)
MCHC: 32.5 g/dL (ref 30.0–36.0)
MCV: 93.1 fL (ref 78.0–100.0)
Platelets: 141 10*3/uL — ABNORMAL LOW (ref 150–400)
RBC: 4.2 MIL/uL — AB (ref 4.22–5.81)
RDW: 14 % (ref 11.5–15.5)
WBC: 8.5 10*3/uL (ref 4.0–10.5)

## 2018-08-01 LAB — MRSA PCR SCREENING: MRSA BY PCR: NEGATIVE

## 2018-08-01 SURGERY — FIXATION, FRACTURE, INTERTROCHANTERIC, WITH INTRAMEDULLARY ROD
Anesthesia: General | Site: Hip | Laterality: Left

## 2018-08-01 MED ORDER — FENTANYL CITRATE (PF) 100 MCG/2ML IJ SOLN
25.0000 ug | INTRAMUSCULAR | Status: DC | PRN
  Administered 2018-08-01: 50 ug via INTRAVENOUS
  Administered 2018-08-01 (×2): 25 ug via INTRAVENOUS
  Administered 2018-08-01: 50 ug via INTRAVENOUS

## 2018-08-01 MED ORDER — LIDOCAINE HCL (CARDIAC) PF 100 MG/5ML IV SOSY
PREFILLED_SYRINGE | INTRAVENOUS | Status: DC | PRN
  Administered 2018-08-01: 50 mg via INTRAVENOUS

## 2018-08-01 MED ORDER — SUCCINYLCHOLINE CHLORIDE 20 MG/ML IJ SOLN
INTRAMUSCULAR | Status: DC | PRN
  Administered 2018-08-01: 100 mg via INTRAVENOUS

## 2018-08-01 MED ORDER — FENTANYL CITRATE (PF) 250 MCG/5ML IJ SOLN
INTRAMUSCULAR | Status: AC
  Filled 2018-08-01: qty 5

## 2018-08-01 MED ORDER — PROPOFOL 10 MG/ML IV BOLUS
INTRAVENOUS | Status: DC | PRN
  Administered 2018-08-01: 20 mg via INTRAVENOUS
  Administered 2018-08-01: 10 mg via INTRAVENOUS
  Administered 2018-08-01: 20 mg via INTRAVENOUS
  Administered 2018-08-01 (×3): 10 mg via INTRAVENOUS
  Administered 2018-08-01: 20 mg via INTRAVENOUS
  Administered 2018-08-01: 10 mg via INTRAVENOUS
  Administered 2018-08-01: 20 mg via INTRAVENOUS
  Administered 2018-08-01 (×3): 10 mg via INTRAVENOUS

## 2018-08-01 MED ORDER — FENTANYL CITRATE (PF) 100 MCG/2ML IJ SOLN
INTRAMUSCULAR | Status: AC
  Filled 2018-08-01: qty 2

## 2018-08-01 MED ORDER — PROPOFOL 10 MG/ML IV BOLUS
INTRAVENOUS | Status: AC
  Filled 2018-08-01: qty 40

## 2018-08-01 MED ORDER — WARFARIN - PHARMACIST DOSING INPATIENT: Freq: Every day | Status: DC

## 2018-08-01 MED ORDER — METOCLOPRAMIDE HCL 10 MG PO TABS: 5.0000 mg | ORAL_TABLET | Freq: Three times a day (TID) | ORAL | Status: DC | PRN

## 2018-08-01 MED ORDER — FENTANYL CITRATE (PF) 100 MCG/2ML IJ SOLN
INTRAMUSCULAR | Status: AC
  Administered 2018-08-01: 50 ug via INTRAVENOUS
  Filled 2018-08-01: qty 2

## 2018-08-01 MED ORDER — SODIUM CHLORIDE 0.9 % IV SOLN
INTRAVENOUS | Status: DC
  Administered 2018-08-01: 15:00:00 via INTRAVENOUS

## 2018-08-01 MED ORDER — BISACODYL 10 MG RE SUPP: 10.0000 mg | Freq: Every day | RECTAL | Status: DC | PRN

## 2018-08-01 MED ORDER — CEFAZOLIN SODIUM-DEXTROSE 1-4 GM/50ML-% IV SOLN
1.0000 g | Freq: Four times a day (QID) | INTRAVENOUS | Status: AC
  Administered 2018-08-01 – 2018-08-02 (×3): 1 g via INTRAVENOUS
  Filled 2018-08-01 (×3): qty 50

## 2018-08-01 MED ORDER — ETOMIDATE 2 MG/ML IV SOLN
INTRAVENOUS | Status: DC | PRN
  Administered 2018-08-01: 12 mg via INTRAVENOUS

## 2018-08-01 MED ORDER — APIXABAN 5 MG PO TABS
5.0000 mg | ORAL_TABLET | Freq: Two times a day (BID) | ORAL | Status: DC
  Administered 2018-08-02 – 2018-08-04 (×5): 5 mg via ORAL
  Filled 2018-08-01 (×5): qty 1

## 2018-08-01 MED ORDER — METHOCARBAMOL 500 MG PO TABS
500.0000 mg | ORAL_TABLET | Freq: Four times a day (QID) | ORAL | Status: DC | PRN
  Administered 2018-08-01 – 2018-08-03 (×4): 500 mg via ORAL
  Filled 2018-08-01 (×4): qty 1

## 2018-08-01 MED ORDER — 0.9 % SODIUM CHLORIDE (POUR BTL) OPTIME
TOPICAL | Status: DC | PRN
  Administered 2018-08-01: 1000 mL

## 2018-08-01 MED ORDER — ALBUMIN HUMAN 5 % IV SOLN
INTRAVENOUS | Status: DC | PRN
  Administered 2018-08-01: 09:00:00 via INTRAVENOUS

## 2018-08-01 MED ORDER — DOCUSATE SODIUM 100 MG PO CAPS
100.0000 mg | ORAL_CAPSULE | Freq: Two times a day (BID) | ORAL | Status: DC
  Administered 2018-08-01: 100 mg via ORAL

## 2018-08-01 MED ORDER — HALOPERIDOL LACTATE 5 MG/ML IJ SOLN
1.0000 mg | Freq: Four times a day (QID) | INTRAMUSCULAR | Status: DC | PRN
  Administered 2018-08-01 – 2018-08-03 (×3): 1 mg via INTRAVENOUS
  Filled 2018-08-01 (×5): qty 1

## 2018-08-01 MED ORDER — POLYETHYLENE GLYCOL 3350 17 G PO PACK: 17.0000 g | PACK | Freq: Every day | ORAL | Status: DC | PRN

## 2018-08-01 MED ORDER — HYDRALAZINE HCL 20 MG/ML IJ SOLN
5.0000 mg | Freq: Four times a day (QID) | INTRAMUSCULAR | Status: DC | PRN
  Filled 2018-08-01: qty 0.25

## 2018-08-01 MED ORDER — ONDANSETRON HCL 4 MG/2ML IJ SOLN: 4.0000 mg | Freq: Four times a day (QID) | INTRAMUSCULAR | Status: DC | PRN

## 2018-08-01 MED ORDER — WARFARIN SODIUM 5 MG PO TABS: 10.0000 mg | ORAL_TABLET | Freq: Once | ORAL | Status: DC

## 2018-08-01 MED ORDER — SUGAMMADEX SODIUM 200 MG/2ML IV SOLN
INTRAVENOUS | Status: DC | PRN
  Administered 2018-08-01 (×2): 200 mg via INTRAVENOUS

## 2018-08-01 MED ORDER — ONDANSETRON HCL 4 MG PO TABS: 4.0000 mg | ORAL_TABLET | Freq: Four times a day (QID) | ORAL | Status: DC | PRN

## 2018-08-01 MED ORDER — CEFAZOLIN SODIUM-DEXTROSE 2-3 GM-%(50ML) IV SOLR
INTRAVENOUS | Status: DC | PRN
  Administered 2018-08-01: 2 g via INTRAVENOUS

## 2018-08-01 MED ORDER — ACETAMINOPHEN 500 MG PO TABS: 500.0000 mg | ORAL_TABLET | Freq: Four times a day (QID) | ORAL | 0 refills | Status: DC | PRN

## 2018-08-01 MED ORDER — ESMOLOL HCL 100 MG/10ML IV SOLN
INTRAVENOUS | Status: DC | PRN
  Administered 2018-08-01 (×7): 20 mg via INTRAVENOUS

## 2018-08-01 MED ORDER — METOCLOPRAMIDE HCL 5 MG/ML IJ SOLN: 5.0000 mg | Freq: Three times a day (TID) | INTRAMUSCULAR | Status: DC | PRN

## 2018-08-01 MED ORDER — METHOCARBAMOL 1000 MG/10ML IJ SOLN
500.0000 mg | Freq: Four times a day (QID) | INTRAVENOUS | Status: DC | PRN
  Filled 2018-08-01: qty 5

## 2018-08-01 MED ORDER — LACTATED RINGERS IV SOLN
INTRAVENOUS | Status: DC | PRN
  Administered 2018-08-01: 08:00:00 via INTRAVENOUS

## 2018-08-01 MED ORDER — ROCURONIUM BROMIDE 100 MG/10ML IV SOLN
INTRAVENOUS | Status: DC | PRN
  Administered 2018-08-01: 25 mg via INTRAVENOUS
  Administered 2018-08-01: 5 mg via INTRAVENOUS

## 2018-08-01 MED ORDER — FENTANYL CITRATE (PF) 100 MCG/2ML IJ SOLN
INTRAMUSCULAR | Status: DC | PRN
  Administered 2018-08-01 (×3): 50 ug via INTRAVENOUS

## 2018-08-01 MED ORDER — PHENYLEPHRINE HCL 10 MG/ML IJ SOLN
INTRAMUSCULAR | Status: DC | PRN
  Administered 2018-08-01: 200 ug via INTRAVENOUS
  Administered 2018-08-01: 50 ug via INTRAVENOUS
  Administered 2018-08-01 (×2): 100 ug via INTRAVENOUS

## 2018-08-01 MED ORDER — ONDANSETRON HCL 4 MG/2ML IJ SOLN
INTRAMUSCULAR | Status: DC | PRN
  Administered 2018-08-01: 4 mg via INTRAVENOUS

## 2018-08-01 MED ORDER — SUGAMMADEX SODIUM 500 MG/5ML IV SOLN
INTRAVENOUS | Status: AC
  Filled 2018-08-01: qty 5

## 2018-08-01 MED ORDER — MAGNESIUM CITRATE PO SOLN
1.0000 | Freq: Once | ORAL | Status: DC | PRN
Start: 2018-08-01 — End: 2018-08-04

## 2018-08-01 SURGICAL SUPPLY — 33 items
BIT DRILL FLUTED FEMUR 4.2/3 (BIT) ×2 IMPLANT
BLADE SURG 15 STRL LF DISP TIS (BLADE) ×1 IMPLANT
BLADE SURG 15 STRL SS (BLADE) ×3
COVER BACK TABLE 60X90IN (DRAPES) ×3 IMPLANT
COVER PERINEAL POST (MISCELLANEOUS) ×3 IMPLANT
COVER SURGICAL LIGHT HANDLE (MISCELLANEOUS) ×6 IMPLANT
DRAPE C-ARM 42X72 X-RAY (DRAPES) ×3 IMPLANT
DRAPE STERI IOBAN 125X83 (DRAPES) ×3 IMPLANT
DRSG ADAPTIC 3X8 NADH LF (GAUZE/BANDAGES/DRESSINGS) ×3 IMPLANT
DRSG MEPILEX BORDER 4X4 (GAUZE/BANDAGES/DRESSINGS) ×3 IMPLANT
DRSG MEPILEX BORDER 4X8 (GAUZE/BANDAGES/DRESSINGS) ×3 IMPLANT
ELECT REM PT RETURN 9FT ADLT (ELECTROSURGICAL) ×3
ELECTRODE REM PT RTRN 9FT ADLT (ELECTROSURGICAL) ×1 IMPLANT
EVACUATOR 1/8 PVC DRAIN (DRAIN) IMPLANT
GLOVE BIOGEL PI IND STRL 9 (GLOVE) ×1 IMPLANT
GLOVE BIOGEL PI INDICATOR 9 (GLOVE) ×2
GLOVE SURG ORTHO 9.0 STRL STRW (GLOVE) ×3 IMPLANT
GOWN STRL REUS W/ TWL XL LVL3 (GOWN DISPOSABLE) ×3 IMPLANT
GOWN STRL REUS W/TWL XL LVL3 (GOWN DISPOSABLE) ×9
GUIDEWIRE 3.2X400 (WIRE) ×2 IMPLANT
KIT BASIN OR (CUSTOM PROCEDURE TRAY) ×3 IMPLANT
KIT TURNOVER KIT B (KITS) ×3 IMPLANT
LINER BOOT UNIVERSAL DISP (MISCELLANEOUS) ×3 IMPLANT
MANIFOLD NEPTUNE II (INSTRUMENTS) ×3 IMPLANT
NAIL TROCH FIX 10X170 130 (Nail) ×2 IMPLANT
NS IRRIG 1000ML POUR BTL (IV SOLUTION) ×3 IMPLANT
PACK GENERAL/GYN (CUSTOM PROCEDURE TRAY) ×3 IMPLANT
PAD ARMBOARD 7.5X6 YLW CONV (MISCELLANEOUS) ×6 IMPLANT
SCREW LOCKING 5.0X34MM (Screw) ×2 IMPLANT
SCREW TFNA P5MM STERILE (Screw) ×2 IMPLANT
STAPLER VISISTAT 35W (STAPLE) IMPLANT
SUT VIC AB 2-0 CTB1 (SUTURE) IMPLANT
WATER STERILE IRR 1000ML POUR (IV SOLUTION) ×6 IMPLANT

## 2018-08-01 NOTE — Progress Notes (Signed)
Patient is very combative, restless, and pulling at his IV, tele cords, and taking his mitts off with his teeth. He is spitting out his medications and keeps trying to get out of the bed. His heart rate has gone up to 160's during to the episodes of being combative.

## 2018-08-01 NOTE — Progress Notes (Signed)
   Please let us know if you need further assistance.  Please see prior consult note.   [facility] will sign off.   Medication Recommendations:  No new Other recommendations (labs, testing, etc):  none Follow up as an outpatient:  As previously sched.   Candee Furbish, MD

## 2018-08-01 NOTE — Consult Note (Signed)
ORTHOPAEDIC CONSULTATION  REQUESTING PHYSICIAN: Elgergawy, Silver Huguenin, MD  Chief Complaint: Left hip pain.  HPI: [name] is a 84 y.o. male who presents with left intertrochanteric hip fracture.  Patient has a history of diabetes as well as coronary artery disease.  Patient had a ground-level fall with no recent history of chest pain.  Past Medical History:  Diagnosis Date  . CAD (coronary artery disease)    (Left main normal, LAD 30-40% mid stenosis, diagonal 30-40% stenosis, circumflex 30-40% stenosis, right coronary artery 30-40% stenosis. 2003.)  . CHF (congestive heart failure) ([place])   . Chronic back pain   . Chronic headache   . Chronic kidney disease   . Chronic pain    right hip/groin, neck, back  . Dementia   . Diabetes mellitus   . Diabetic neuropathy ([place])   . Diabetic retinopathy    right eye   . DVT of leg (deep venous thrombosis) ([place]) 1999   RLE   . GERD (gastroesophageal reflux disease)   . Goiter    stable x many year - last endo exam 08/2010  . Hiatal hernia   . HTN (hypertension)   . Hyperlipidemia   . Hypertension   . Lumbar spondylosis   . Macular degeneration   . Osteopenia   . Peripheral vertigo   . Prostate cancer ([place])   . Sleep apnea    CPAP  . Thyroid nodule   . Type 2 diabetes mellitus ([place])    Past Surgical History:  Procedure Laterality Date  . APPENDECTOMY    . biopsy of right ear    . CATARACT EXTRACTION, BILATERAL    . CHOLECYSTECTOMY    . INGUINAL HERNIA REPAIR     bilateral  . INGUINAL HERNIA REPAIR     Bilateral  . KNEE ARTHROSCOPY     left   . TONSILECTOMY, ADENOIDECTOMY, BILATERAL MYRINGOTOMY AND TUBES    . TONSILLECTOMY AND ADENOIDECTOMY     Social History   Socioeconomic History  . Marital status: Divorced    Spouse name: Not on file  . Number of children: 4  . Years of education: Not on file  . Highest education level: Not on file  Occupational History  . Occupation: Retired    Fish farm manager: RETIRED    Social Needs  . Financial resource strain: Not on file  . Food insecurity:    Worry: Not on file    Inability: Not on file  . Transportation needs:    Medical: Not on file    Non-medical: Not on file  Tobacco Use  . Smoking status: Former Smoker    Types: Cigarettes, Pipe, Cigars    Last attempt to quit: 11/08/2003    Years since quitting: 14.7  . Smokeless tobacco: Former Systems developer    Quit date: 11/08/2003  Substance and Sexual Activity  . Alcohol use: No  . Drug use: No  . Sexual activity: Never  Lifestyle  . Physical activity:    Days per week: Not on file    Minutes per session: Not on file  . Stress: Not on file  Relationships  . Social connections:    Talks on phone: Not on file    Gets together: Not on file    Attends religious service: Not on file    Active member of club or organization: Not on file    Attends meetings of clubs or organizations: Not on file    Relationship status: Not on file  Other  Topics Concern  . Not on file  Social History Narrative   ** Merged History Encounter **       Family History  Problem Relation Age of Onset  . Hyperlipidemia Mother   . Hypertension Mother   . Deep vein thrombosis Mother   . Pulmonary embolism Mother   . Diabetes Mother   . Prostate cancer Father   . Cancer Father 61  . Colon cancer Sister   . Depression Sister   . Dementia Sister   . Dementia Sister   . Scleroderma Brother    - negative except otherwise stated in the family history section Allergies  Allergen Reactions  . Actos [Pioglitazone]     Allergic reaction  . Bactrim Other (See Comments)    Unknown   . Doxycycline     Caused black spots in his eyes  . Flagyl [Metronidazole Hcl]     unknown  . Gabapentin   . Lyrica [Pregabalin]     Increase appetite   . Motrin [Ibuprofen]   . Nsaids     Swelling    . Ultram [Tramadol Hcl]     Itching / rash     . Vioxx [Rofecoxib]     Swelling    . Celebrex [Celecoxib] Rash  . Celebrex [Celecoxib]  Rash  . Morphine And Related Rash   Prior to Admission medications   Medication Sig Start Date End Date Taking? Authorizing Provider  acetaminophen (TYLENOL) 650 MG CR tablet Take 650 mg by mouth every 8 (eight) hours. Given at 8:00 am, 2:00 pm, and 8:00 pm.   Yes [provider]  amLODipine (NORVASC) 2.5 MG tablet Take 2.5 mg by mouth daily.   Yes [provider]  aspirin (ASPIRIN CHILDRENS) 81 MG chewable tablet Chew 81 mg by mouth daily.     Yes [provider]  atorvastatin (LIPITOR) 20 MG tablet Take 20 mg by mouth daily at 6 PM.    Yes [provider]  carboxymethylcellul-glycerin (OPTIVE) 0.5-0.9 % ophthalmic solution Place 1 drop into both eyes as needed for dry eyes.   Yes [provider]  docusate sodium (STOOL SOFTENER) 100 MG capsule Take 100 mg by mouth 2 (two) times daily.    Yes [provider]  donepezil (ARICEPT) 10 MG tablet Take 10 mg by mouth at bedtime.   Yes [provider]  escitalopram (LEXAPRO) 10 MG tablet Take 10 mg by mouth daily.   Yes [provider]  fentaNYL (DURAGESIC - DOSED MCG/HR) 50 MCG/HR Place 1 patch (50 mcg total) onto the skin every 3 (three) days. 06/06/16  Yes Timmothy Euler, MD  finasteride (PROSCAR) 5 MG tablet Take 1 tablet (5 mg total) by mouth daily. 12/06/15  Yes Martin, Mary-Margaret, FNP  loratadine (CLARITIN) 10 MG tablet Take 10 mg by mouth daily as needed for allergies.   Yes [provider]  magnesium hydroxide (MILK OF MAGNESIA) 400 MG/5ML suspension Take 30 mLs by mouth daily as needed for mild constipation.   Yes [provider]  Melatonin 3 MG TABS Take 1 tablet by mouth at bedtime.   Yes [provider]  Menthol, Topical Analgesic, (BIOFREEZE EX) Apply 1 application topically daily as needed (back, side, and shoulder pain).   Yes [provider]  metFORMIN (GLUCOPHAGE-XR) 750 MG 24 hr tablet Take 750 mg by mouth every evening.    Yes [provider]  omeprazole (PRILOSEC) 20 MG capsule Take 1 capsule (20 mg total) by mouth  daily. 12/06/15  Yes Hassell Done, Mary-Margaret, FNP  oxycodone (OXY-IR) 5 MG capsule Take 5 mg by mouth every 8 (eight) hours as needed for pain.    Yes [provider]  Polyethyl Glycol-Propyl Glycol (SYSTANE) 0.4-0.3 % SOLN Place 1 drop into the right eye 4 (four) times daily as needed.   Yes [provider]  polyethylene glycol (MIRALAX / GLYCOLAX) packet Take 17 g by mouth 2 (two) times daily.   Yes [provider]  tiZANidine (ZANAFLEX) 4 MG tablet Take 4 mg by mouth 3 (three) times daily.   Yes [provider]  warfarin (COUMADIN) 6 MG tablet Take 6 mg by mouth daily at 6 PM.   Yes [provider]  ACCU-CHEK AVIVA PLUS test strip CHECK BLOOD SUGAR 2 TO 3 TIMES A DAY AND AS NEEDED 06/03/16   Timmothy Euler, MD  metFORMIN (GLUCOPHAGE-XR) 500 MG 24 hr tablet Take 1 tablet (500 mg total) by mouth daily with breakfast. Patient not taking: Reported on 07/30/2018 12/06/15   [name], [place]. Devices (HUGO ROLLING WALKER ELITE) MISC 1 each by Does not apply route daily. 08/04/15   [name], FNP   Dg Chest 1 View  Result Date: 07/30/2018 CLINICAL DATA:  84 year old male status post fall with proximal left femur fracture. EXAM: CHEST  1 VIEW COMPARISON:  01/25/2018 and earlier. FINDINGS: AP supine view at 1008 hours. Stable cardiomegaly and mediastinal contours. Visualized tracheal air column is within normal limits. Stable lung volumes. No pneumothorax, pulmonary edema, pleural effusion or confluent pulmonary opacity. IMPRESSION: Chronic cardiomegaly.  No acute cardiopulmonary abnormality. Electronically Signed   By: Genevie Ann M.D.   On: 07/30/2018 10:27   Ct Head Wo Contrast  Result Date: 07/30/2018 CLINICAL DATA:  Fall last night broken hip. EXAM: CT HEAD WITHOUT CONTRAST CT CERVICAL SPINE WITHOUT CONTRAST TECHNIQUE: Multidetector CT  imaging of the head and cervical spine was performed following the standard protocol without intravenous contrast. Multiplanar CT image reconstructions of the cervical spine were also generated. COMPARISON:  CT head 01/25/2018 FINDINGS: CT HEAD FINDINGS Brain: Image quality degraded by motion. Multiple images repeated due to motion Moderate to advanced atrophy. Moderate chronic white matter changes. No acute infarct, hemorrhage, or mass. Negative for subdural hematoma. Vascular: Negative for hyperdense vessel Skull: Negative for fracture Sinuses/Orbits: Paranasal sinuses clear. Bilateral cataract surgery. Other: None CT CERVICAL SPINE FINDINGS Alignment: 2 mm anterolisthesis C3-4 unchanged from prior neck CT of 08/19/2016. Extensive facet degeneration at this level. Mild anterolisthesis at C6-7 also unchanged. Skull base and vertebrae: Negative for acute fracture. Negative for mass lesion. Soft tissues and spinal canal: Cystic nodule right lobe of the thyroid measuring approximately 23 x 38 mm on axial images. Coarse calcification along the superior margin of the nodule. No adenopathy in the neck. Disc levels: Marked facet degeneration at C3-4. Diffuse uncinate spurring with moderate foraminal encroachment bilaterally and mild spinal stenosis. Moderate foraminal narrowing due to spurring and C5-6 and C6-7. Mild foraminal narrowing bilaterally at C4-5. Upper chest: Negative Other: None IMPRESSION: Image quality degraded by motion Atrophy and chronic microvascular ischemia. No acute intracranial abnormality 1. Extensive cervical spine degenerative change without fracture. 2. Right thyroid cystic nodule 23 x 28 mm. Electronically Signed   By: Franchot Gallo M.D.   On: 07/30/2018 12:58   Ct Cervical Spine Wo Contrast  Result Date: 07/30/2018 CLINICAL DATA:  Fall last night broken hip. EXAM: CT HEAD WITHOUT CONTRAST CT CERVICAL SPINE WITHOUT CONTRAST TECHNIQUE: Multidetector CT  imaging of the head and cervical spine  was performed following the standard protocol without intravenous contrast. Multiplanar CT image reconstructions of the cervical spine were also generated. COMPARISON:  CT head 01/25/2018 FINDINGS: CT HEAD FINDINGS Brain: Image quality degraded by motion. Multiple images repeated due to motion Moderate to advanced atrophy. Moderate chronic white matter changes. No acute infarct, hemorrhage, or mass. Negative for subdural hematoma. Vascular: Negative for hyperdense vessel Skull: Negative for fracture Sinuses/Orbits: Paranasal sinuses clear. Bilateral cataract surgery. Other: None CT CERVICAL SPINE FINDINGS Alignment: 2 mm anterolisthesis C3-4 unchanged from prior neck CT of 08/19/2016. Extensive facet degeneration at this level. Mild anterolisthesis at C6-7 also unchanged. Skull base and vertebrae: Negative for acute fracture. Negative for mass lesion. Soft tissues and spinal canal: Cystic nodule right lobe of the thyroid measuring approximately 23 x 38 mm on axial images. Coarse calcification along the superior margin of the nodule. No adenopathy in the neck. Disc levels: Marked facet degeneration at C3-4. Diffuse uncinate spurring with moderate foraminal encroachment bilaterally and mild spinal stenosis. Moderate foraminal narrowing due to spurring and C5-6 and C6-7. Mild foraminal narrowing bilaterally at C4-5. Upper chest: Negative Other: None IMPRESSION: Image quality degraded by motion Atrophy and chronic microvascular ischemia. No acute intracranial abnormality 1. Extensive cervical spine degenerative change without fracture. 2. Right thyroid cystic nodule 23 x 28 mm. Electronically Signed   By: Franchot Gallo M.D.   On: 07/30/2018 12:58   Dg Hip Unilat W Or Wo Pelvis 2-3 Views Left  Result Date: 07/30/2018 CLINICAL DATA:  84 year old male status post fall at 1930 hours yesterday. Left hip pain. EXAM: DG HIP (WITH OR WITHOUT PELVIS) 2-3V LEFT COMPARISON:  Pelvis and right hip series 01/25/2018. FINDINGS:  Comminuted intertrochanteric fracture of the proximal left femur with mild varus angulation. Femoral heads remain normally located. Hip joint spaces remain normal. Grossly intact proximal right femur. No acute pelvic fracture identified. Calcified aortoiliac atherosclerosis. Negative visible bowel gas pattern. IMPRESSION: Comminuted left femur intertrochanteric fracture with minimal varus angulation. Electronically Signed   By: Genevie Ann M.D.   On: 07/30/2018 10:26   - pertinent xrays, CT, MRI studies were reviewed and independently interpreted  Positive ROS: All other systems have been reviewed and were otherwise negative with the exception of those mentioned in the HPI and as above.  Physical Exam: General: Alert, no acute distress Psychiatric: Patient is competent for consent with normal mood and affect Lymphatic: No axillary or cervical lymphadenopathy Cardiovascular: No pedal edema Respiratory: No cyanosis, no use of accessory musculature GI: No organomegaly, abdomen is soft and non-tender    Images:  @ENCIMAGES @  Labs:  Lab Results  Component Value Date   HGBA1C 6.8 (H) 01/25/2018   HGBA1C 7.3 12/06/2015   HGBA1C 7.3 08/04/2015   REPTSTATUS 01/28/2018 FINAL 01/25/2018   CULT >=100,000 COLONIES/mL STAPHYLOCOCCUS EPIDERMIDIS (A) 01/25/2018   LABORGA STAPHYLOCOCCUS EPIDERMIDIS (A) 01/25/2018    Lab Results  Component Value Date   ALBUMIN 3.8 07/30/2018   ALBUMIN 3.7 01/25/2018   ALBUMIN 4.2 06/06/2016    Neurologic: Patient does not have protective sensation bilateral lower extremities.   MUSCULOSKELETAL:   Skin: Patient has no skin breakdown there is some bruising.  Patient's left lower extremity is shortened and externally rotated.  Radiographs shows an intertrochanteric hip fracture.  Patient's hemoglobin A1c is well controlled at 6.8 patient's albumin is 3.8.  Assessment: Assessment: Diabetes with coronary artery disease with history of atrial fibrillation most  recently on blood thinners with a  intertrochanteric left hip fracture.  Plan: Plan: We will plan for intramedullary nail fixation of the left hip.  Risks and benefits were discussed including infection neurovascular injury pain DVT pulmonary embolus need for additional surgery comorbidities due to cardiac condition.  Patient and family state they understand and wish to proceed at this time.  Thank you for the consult and the opportunity to see Mr. Yeison Sippel, [place] [phone] 7:28 AM

## 2018-08-01 NOTE — Anesthesia Postprocedure Evaluation (Signed)
Anesthesia Post Note  Patient: [name]  Procedure(s) Performed: INTRAMEDULLARY (IM) NAIL INTERTROCHANTRIC (Left Hip)     Patient location during evaluation: PACU Anesthesia Type: General Level of consciousness: awake Pain management: pain level controlled Vital Signs Assessment: post-procedure vital signs reviewed and stable Respiratory status: spontaneous breathing Cardiovascular status: stable Postop Assessment: no headache Anesthetic complications: no    Last Vitals:  Vitals:   08/01/18 1133 08/01/18 1144  BP: (!) 128/91   Pulse: 80 83  Resp: 15 18  Temp:  36.7 C  SpO2: 100% 100%    Last Pain:  Vitals:   08/01/18 0540  TempSrc: Oral  PainSc:                  Jahmel Flannagan

## 2018-08-01 NOTE — Progress Notes (Signed)
ANTICOAGULATION CONSULT NOTE - Initial Consult  Pharmacy Consult for warfarin Indication: atrial fibrillation and hx of VTE  Allergies  Allergen Reactions  . Actos [Pioglitazone]     Allergic reaction  . Bactrim Other (See Comments)    Unknown   . Doxycycline     Caused black spots in his eyes  . Flagyl [Metronidazole Hcl]     unknown  . Gabapentin   . Lyrica [Pregabalin]     Increase appetite   . Motrin [Ibuprofen]   . Nsaids     Swelling    . Ultram [Tramadol Hcl]     Itching / rash     . Vioxx [Rofecoxib]     Swelling    . Celebrex [Celecoxib] Rash  . Celebrex [Celecoxib] Rash  . Morphine And Related Rash    Patient Measurements: Height: 5\' 8"  (172.7 cm) Weight: 218 lb (98.9 kg) IBW/kg (Calculated) : 68.4  Vital Signs: Temp: 98.1 F (36.7 C) (08/03 1144) Temp Source: Oral (08/03 0540) BP: 128/91 (08/03 1133) Pulse Rate: 83 (08/03 1144)  Labs: Recent Labs    07/30/18 1120 07/31/18 0748 08/01/18 0704  HGB 12.7*  --  12.7*  HCT 37.8*  --  39.1  PLT 121*  --  141*  LABPROT 21.0* 16.9*  --   INR 1.83 1.38  --   CREATININE 1.08  --  1.16    Estimated Creatinine Clearance: 53.1 mL/min (by C-G formula based on SCr of 1.16 mg/dL).   Medical History: Past Medical History:  Diagnosis Date  . CAD (coronary artery disease)    (Left main normal, LAD 30-40% mid stenosis, diagonal 30-40% stenosis, circumflex 30-40% stenosis, right coronary artery 30-40% stenosis. 2003.)  . CHF (congestive heart failure) ([place])   . Chronic back pain   . Chronic headache   . Chronic kidney disease   . Chronic pain    right hip/groin, neck, back  . Dementia   . Diabetes mellitus   . Diabetic neuropathy ([place])   . Diabetic retinopathy    right eye   . DVT of leg (deep venous thrombosis) ([place]) 1999   RLE   . GERD (gastroesophageal reflux disease)   . Goiter    stable x many year - last endo exam 08/2010  . Hiatal hernia   . HTN (hypertension)   . Hyperlipidemia   .  Hypertension   . Lumbar spondylosis   . Macular degeneration   . Osteopenia   . Peripheral vertigo   . Prostate cancer ([place])   . Sleep apnea    CPAP  . Thyroid nodule   . Type 2 diabetes mellitus ([facility])     Assessment: [name] is an 84 y.o. M, admitted to [facility] ED post fall  at SNF, work-up significant for left femur fracture, as well was noted to have paroxysmal A. Fib. Pt underwent intramedullary nail fixation of the left hip on 8/3. Patient on warfarin PTA, regimen 6mg  daily. Last dose 7/31. Pharmacy consulted to resume warfarin on 8/3. CBC stable. No overt s/sx of bleeding noted.  INR on 8/2 subtherapeutic at 1.38. Patient received vitamin K prior to surgery to normalize INR, and has not received warfarin since 7/31.  Goal of Therapy:  INR 2-3 Monitor platelets by anticoagulation protocol: Yes   Plan:  Will give higher dose of warfarin 10mg  x1 tonight, likely will be working against vitamin K for a few days. Daily INR, CBC, monitor for s/sx of bleeding  Thank you for involving  pharmacy in this patient's care.  Janae Bridgeman, PharmD PGY1 Pharmacy Resident Phone: [phone] 08/01/2018 12:22 PM

## 2018-08-01 NOTE — Progress Notes (Signed)
Patient pulled out his IV.

## 2018-08-01 NOTE — Anesthesia Preprocedure Evaluation (Signed)
Anesthesia Evaluation  Patient identified by MRN, date of birth, ID band Patient awake    Reviewed: Allergy & Precautions, NPO status , Patient's Chart, lab work & pertinent test results  Airway Mallampati: II  TM Distance: >3 FB     Dental   Pulmonary sleep apnea , former smoker,    breath sounds clear to auscultation       Cardiovascular hypertension, + CAD, + Past MI, + Peripheral Vascular Disease and +CHF   Rhythm:Regular Rate:Normal     Neuro/Psych  Headaches, Dementia  Neuromuscular disease    GI/Hepatic Neg liver ROS, hiatal hernia, GERD  ,  Endo/Other  diabetes  Renal/GU Renal disease     Musculoskeletal   Abdominal   Peds  Hematology   Anesthesia Other Findings   Reproductive/Obstetrics                             Anesthesia Physical Anesthesia Plan  ASA: IV  Anesthesia Plan: General   Post-op Pain Management:    Induction: Intravenous  PONV Risk Score and Plan: Treatment may vary due to age or medical condition  Airway Management Planned: Oral ETT  Additional Equipment:   Intra-op Plan:   Post-operative Plan: Possible Post-op intubation/ventilation  Informed Consent: I have reviewed the patients History and Physical, chart, labs and discussed the procedure including the risks, benefits and alternatives for the proposed anesthesia with the patient or authorized representative who has indicated his/her understanding and acceptance.   Dental advisory given  Plan Discussed with: CRNA and Anesthesiologist  Anesthesia Plan Comments:         Anesthesia Quick Evaluation

## 2018-08-01 NOTE — Progress Notes (Signed)
Ok to remove A-line per  DR [employer]. Aline has not worked since pt arrived.

## 2018-08-01 NOTE — Transfer of Care (Signed)
Immediate Anesthesia Transfer of Care Note  Patient: [name]  Procedure(s) Performed: INTRAMEDULLARY (IM) NAIL INTERTROCHANTRIC (Left Hip)  Patient Location: PACU  Anesthesia Type:General  Level of Consciousness: awake, alert  and pateint uncooperative  Airway & Oxygen Therapy: Patient Spontanous Breathing and Patient connected to face mask oxygen  Post-op Assessment: Report given to RN and Post -op Vital signs reviewed and stable  Post vital signs: Reviewed and stable  Last Vitals:  Vitals Value Taken Time  BP 151/74 08/01/2018 10:03 AM  Temp    Pulse 87 08/01/2018 10:17 AM  Resp 19 08/01/2018 10:17 AM  SpO2 100 % 08/01/2018 10:17 AM  Vitals shown include unvalidated device data.  Last Pain:  Vitals:   08/01/18 0540  TempSrc: Oral  PainSc:          Complications: No apparent anesthesia complications

## 2018-08-01 NOTE — Progress Notes (Signed)
[name] is a 84 y.o. male patient admitted from ED awake, alert - oriented  X 4 - no acute distress noted.  VSS - Blood pressure (!) 135/93, pulse 83, temperature 97.8 F (36.6 C), temperature source Oral, resp. rate 18, height 5\' 8"  (1.727 m), weight 98.9 kg (218 lb), SpO2 99 %.    IV in place, occlusive dsg intact without redness.  Orientation to room, and floor completed with information packet given to patient/family.  Patient declined safety video at this time.  Admission INP armband ID verified with patient/family, and in place.   SR up x 2, fall assessment complete, with patient and family able to verbalize understanding of risk associated with falls, and verbalized understanding to call nsg before up out of bed.  Call light within reach, patient able to voice, and demonstrate understanding.  Skin, clean-dry- intact without evidence of bruising, or skin tears.   No evidence of skin break down noted on exam.   Progressive care new admit protocol was done.  Will cont to eval and treat per MD orders.  Holley Raring, RN 08/01/2018 12:24 PM

## 2018-08-01 NOTE — Progress Notes (Signed)
PROGRESS NOTE                                                                                                                                                                                                             Patient Demographics:    [name], is a 84 y.o. male, DOB - 1930/01/25, BJY:[id number]  Admit date - 07/30/2018   Admitting Physician [name], DO  Outpatient Primary MD for the patient is [name], [place]  LOS - 2   Chief Complaint  Patient presents with  . Hip Pain       Brief Narrative   84 y.o. male with medical history significant for dementia, coronary artery disease, RLE DVT on Coumadin, OSA, type 2 diabetes, diabetic polyneuropathy, hypertension, hyperlipidemia, who presented to [facility] ED post fall  at SNF, work-up significant for left femur fracture, as well was noted to have paroxysmal A. fib which is a new diagnosis, patient went for surgical repair 08/01/2018   Subjective:    [name] today denies any chest pain, shortness of breath, no nausea or vomiting.  Planes of pain and left hip hip   Assessment  & Plan :    Principal Problem:   Displaced intertrochanteric fracture of left femur, initial encounter for closed fracture (HCC)   Left comminuted femur intertrochanteric fracture -Due to mechanical fall, orthopedic consult greatly appreciated, overall high risk for surgery, but no further work-up needed before surgery, patient went for surgical repair 08/01/2018 .  Acute metabolic encephalopathy most likely secondary to acute illness -Apparently patient was agitated and confused on presentation, and significantly improved since admission  Paroxysmal A. fib with RVR -Patient was noted to have intermittent episodes of paroxysmal A. fib during these episodes heart rate goes are controlled in the 130s and 140s, otherwise he is in normal sinus rhythm, continue with telemetry monitoring, 2D echo  with no significant valvular disease, or left atrium enlargement or regional wall motion abnormality. -Cardiology consult appreciated -on Warfarin for history of DVT, he did receive vitamin K to reverse it, I will start on Eliquis   Hypertension -Uncontrolled when I have seen him in PACU today, continue with amlodipine, I have ordered PRN hydralazine, I will hold on increasing amlodipine or adding new meds given he was on phenylephrine earlier today during surgery .  Hyperlipidemia -  Continue Lipitor  Chronic depression/anxiety - Continue Lexapro   BPH - On finasteride  Type 2 diabetes -Hold home dose metformin, continue with insulin sliding scale during hospital stay  Hypokalemia -Repleted  History of RLE DVT on Coumadin -On warfarin prior to admission, has been changed to Eliquis  Dementia - Continue donepezil  UTI -Continue with Rocephin.    Code Status : DNR  Family Communication  : None at bedside  Disposition Plan  : will need SNF  Consults  :  Ortho  Procedures  : INTRAMEDULLARY (IM) NAIL INTERTROCHANTRIC of left hip by Dr. Sharol Given 08/01/2018  DVT Prophylaxis  :  Eliquis  Lab Results  Component Value Date   PLT 141 (L) 08/01/2018    Antibiotics  :    Anti-infectives (From admission, onward)   Start     Dose/Rate Route Frequency Ordered Stop   08/01/18 1500  ceFAZolin (ANCEF) IVPB 1 g/50 mL premix     1 g 100 mL/hr over 30 Minutes Intravenous Every 6 hours 08/01/18 1407 08/02/18 0859   07/31/18 1000  cefTRIAXone (ROCEPHIN) 1 g in sodium chloride 0.9 % 100 mL IVPB     1 g 200 mL/hr over 30 Minutes Intravenous Daily 07/31/18 0819     07/31/18 0945  ceFAZolin (ANCEF) IVPB 2g/100 mL premix     2 g 200 mL/hr over 30 Minutes Intravenous On call to O.R. 07/31/18 0943 08/01/18 0559        Objective:   Vitals:   08/01/18 1133 08/01/18 1144 08/01/18 1220 08/01/18 1307  BP: (!) 128/91  (!) 135/93   Pulse: 80 83 83 85  Resp: 15 18 18 18   Temp:   98.1 F (36.7 C) 97.8 F (36.6 C)   TempSrc:   Oral   SpO2: 100% 100% 99% 98%  Weight:      Height:        Wt Readings from Last 3 Encounters:  07/31/18 98.9 kg (218 lb)  01/25/18 99.2 kg (218 lb 11.2 oz)  06/30/17 92.1 kg (203 lb)     Intake/Output Summary (Last 24 hours) at 08/01/2018 1419 Last data filed at 08/01/2018 1308 Gross per 24 hour  Intake 3299.36 ml  Output 300 ml  Net 2999.36 ml     Physical Exam  It was seen in PACU, awake Alert, confused  symmetrical Chest wall movement, Good air movement bilaterally, CTAB RRR,No Gallops,Rubs or new Murmurs, No Parasternal Heave +ve B.Sounds, Abd Soft, No tenderness, No rebound - guarding or rigidity. No Cyanosis, Clubbing or edema, No new Rash or bruise       Data Review:    CBC Recent Labs  Lab 07/30/18 1120 08/01/18 0704  WBC 8.7 8.5  HGB 12.7* 12.7*  HCT 37.8* 39.1  PLT 121* 141*  MCV 91.3 93.1  MCH 30.7 30.2  MCHC 33.6 32.5  RDW 14.0 14.0  LYMPHSABS 2.9  --   MONOABS 0.4  --   EOSABS 0.0  --   BASOSABS 0.0  --     Chemistries  Recent Labs  Lab 07/30/18 1120 08/01/18 0704  NA 139 140  K 3.4* 3.9  CL 101 105  CO2 30 25  GLUCOSE 123* 115*  BUN 18 22  CREATININE 1.08 1.16  CALCIUM 8.9 8.6*  AST 25  --   ALT 16  --   ALKPHOS 70  --   BILITOT 1.3*  --    ------------------------------------------------------------------------------------------------------------------ No results for input(s): CHOL, HDL, LDLCALC, TRIG, CHOLHDL, LDLDIRECT in  the last 72 hours.  Lab Results  Component Value Date   HGBA1C 6.8 (H) 01/25/2018   ------------------------------------------------------------------------------------------------------------------ No results for input(s): TSH, T4TOTAL, T3FREE, THYROIDAB in the last 72 hours.  Invalid input(s): FREET3 ------------------------------------------------------------------------------------------------------------------ No results for input(s): VITAMINB12,  FOLATE, FERRITIN, TIBC, IRON, RETICCTPCT in the last 72 hours.  Coagulation profile Recent Labs  Lab 07/30/18 1120 07/31/18 0748  INR 1.83 1.38    No results for input(s): DDIMER in the last 72 hours.  Cardiac Enzymes No results for input(s): CKMB, TROPONINI, MYOGLOBIN in the last 168 hours.  Invalid input(s): CK ------------------------------------------------------------------------------------------------------------------    Component Value Date/Time   BNP 141.1 (H) 09/19/2015 1355    Inpatient Medications  Scheduled Meds: . sodium chloride   Intravenous Once  . amLODipine  2.5 mg Oral Daily  . atorvastatin  20 mg Oral q1800  . docusate sodium  100 mg Oral BID  . donepezil  10 mg Oral QHS  . fentaNYL      . finasteride  5 mg Oral Daily  . guaiFENesin  1,200 mg Oral BID  . Melatonin  1 tablet Oral QHS  . metoprolol tartrate  5 mg Intravenous Once  . pantoprazole  40 mg Oral Daily  . polyethylene glycol  17 g Oral Daily  . senna  1 tablet Oral Daily  . warfarin  10 mg Oral ONCE-1800  . Warfarin - Pharmacist Dosing Inpatient   Does not apply q1800   Continuous Infusions: . sodium chloride 1,000 mL (08/01/18 1307)  . sodium chloride    .  ceFAZolin (ANCEF) IV    . cefTRIAXone (ROCEPHIN)  IV Stopped (07/31/18 1149)  . methocarbamol (ROBAXIN) IV     PRN Meds:.acetaminophen, bisacodyl, hydrALAZINE, hydrALAZINE, hydroxypropyl methylcellulose / hypromellose, magnesium citrate, methocarbamol **OR** methocarbamol (ROBAXIN) IV, metoCLOPramide **OR** metoCLOPramide (REGLAN) injection, ondansetron **OR** ondansetron (ZOFRAN) IV, polyethylene glycol  Micro Results Recent Results (from the past 240 hour(s))  Surgical pcr screen     Status: None   Collection Time: 07/30/18 11:26 PM  Result Value Ref Range Status   MRSA, PCR NEGATIVE NEGATIVE Final   Staphylococcus aureus NEGATIVE NEGATIVE Final    Comment: (NOTE) The Xpert SA Assay (FDA approved for NASAL specimens in  patients 78 years of age and older), is one component of a comprehensive surveillance program. It is not intended to diagnose infection nor to guide or monitor treatment. Performed at [facility], [place] [address] [place], [place] [address]   MRSA PCR Screening     Status: None   Collection Time: 08/01/18 12:26 PM  Result Value Ref Range Status   MRSA by PCR NEGATIVE NEGATIVE Final    Comment:        The GeneXpert MRSA Assay (FDA approved for NASAL specimens only), is one component of a comprehensive MRSA colonization surveillance program. It is not intended to diagnose MRSA infection nor to guide or monitor treatment for MRSA infections. Performed at [facility], [place] [address], [place], [place] [address]     Radiology Reports Dg Chest 1 View  Result Date: 07/30/2018 CLINICAL DATA:  84 year old male status post fall with proximal left femur fracture. EXAM: CHEST  1 VIEW COMPARISON:  01/25/2018 and earlier. FINDINGS: AP supine view at 1008 hours. Stable cardiomegaly and mediastinal contours. Visualized tracheal air column is within normal limits. Stable lung volumes. No pneumothorax, pulmonary edema, pleural effusion or confluent pulmonary opacity. IMPRESSION: Chronic cardiomegaly.  No acute cardiopulmonary abnormality. Electronically Signed   By:  Genevie Ann M.D.   On: 07/30/2018 10:27   Ct Head Wo Contrast  Result Date: 07/30/2018 CLINICAL DATA:  Fall last night broken hip. EXAM: CT HEAD WITHOUT CONTRAST CT CERVICAL SPINE WITHOUT CONTRAST TECHNIQUE: Multidetector CT imaging of the head and cervical spine was performed following the standard protocol without intravenous contrast. Multiplanar CT image reconstructions of the cervical spine were also generated. COMPARISON:  CT head 01/25/2018 FINDINGS: CT HEAD FINDINGS Brain: Image quality degraded by motion. Multiple images repeated due to motion Moderate to advanced atrophy. Moderate chronic white matter changes. No acute  infarct, hemorrhage, or mass. Negative for subdural hematoma. Vascular: Negative for hyperdense vessel Skull: Negative for fracture Sinuses/Orbits: Paranasal sinuses clear. Bilateral cataract surgery. Other: None CT CERVICAL SPINE FINDINGS Alignment: 2 mm anterolisthesis C3-4 unchanged from prior neck CT of 08/19/2016. Extensive facet degeneration at this level. Mild anterolisthesis at C6-7 also unchanged. Skull base and vertebrae: Negative for acute fracture. Negative for mass lesion. Soft tissues and spinal canal: Cystic nodule right lobe of the thyroid measuring approximately 23 x 38 mm on axial images. Coarse calcification along the superior margin of the nodule. No adenopathy in the neck. Disc levels: Marked facet degeneration at C3-4. Diffuse uncinate spurring with moderate foraminal encroachment bilaterally and mild spinal stenosis. Moderate foraminal narrowing due to spurring and C5-6 and C6-7. Mild foraminal narrowing bilaterally at C4-5. Upper chest: Negative Other: None IMPRESSION: Image quality degraded by motion Atrophy and chronic microvascular ischemia. No acute intracranial abnormality 1. Extensive cervical spine degenerative change without fracture. 2. Right thyroid cystic nodule 23 x 28 mm. Electronically Signed   By: Franchot Gallo M.D.   On: 07/30/2018 12:58   Ct Cervical Spine Wo Contrast  Result Date: 07/30/2018 CLINICAL DATA:  Fall last night broken hip. EXAM: CT HEAD WITHOUT CONTRAST CT CERVICAL SPINE WITHOUT CONTRAST TECHNIQUE: Multidetector CT imaging of the head and cervical spine was performed following the standard protocol without intravenous contrast. Multiplanar CT image reconstructions of the cervical spine were also generated. COMPARISON:  CT head 01/25/2018 FINDINGS: CT HEAD FINDINGS Brain: Image quality degraded by motion. Multiple images repeated due to motion Moderate to advanced atrophy. Moderate chronic white matter changes. No acute infarct, hemorrhage, or mass. Negative  for subdural hematoma. Vascular: Negative for hyperdense vessel Skull: Negative for fracture Sinuses/Orbits: Paranasal sinuses clear. Bilateral cataract surgery. Other: None CT CERVICAL SPINE FINDINGS Alignment: 2 mm anterolisthesis C3-4 unchanged from prior neck CT of 08/19/2016. Extensive facet degeneration at this level. Mild anterolisthesis at C6-7 also unchanged. Skull base and vertebrae: Negative for acute fracture. Negative for mass lesion. Soft tissues and spinal canal: Cystic nodule right lobe of the thyroid measuring approximately 23 x 38 mm on axial images. Coarse calcification along the superior margin of the nodule. No adenopathy in the neck. Disc levels: Marked facet degeneration at C3-4. Diffuse uncinate spurring with moderate foraminal encroachment bilaterally and mild spinal stenosis. Moderate foraminal narrowing due to spurring and C5-6 and C6-7. Mild foraminal narrowing bilaterally at C4-5. Upper chest: Negative Other: None IMPRESSION: Image quality degraded by motion Atrophy and chronic microvascular ischemia. No acute intracranial abnormality 1. Extensive cervical spine degenerative change without fracture. 2. Right thyroid cystic nodule 23 x 28 mm. Electronically Signed   By: Franchot Gallo M.D.   On: 07/30/2018 12:58   Dg C-arm 1-60 Min  Result Date: 08/01/2018 CLINICAL DATA:  Intratrochanteric left femoral fracture EXAM: DG C-ARM 61-120 MIN; OPERATIVE LEFT HIP WITH PELVIS COMPARISON:  None. FLUOROSCOPY TIME:  Radiation  Exposure Index (as provided by the fluoroscopic device): Not available If the device does not provide the exposure index: Fluoroscopy Time:  26 seconds Number of Acquired Images:  2 FINDINGS: Proximal fixation rod and fixation screw are noted with near anatomic alignment of the fracture fragments. No other focal abnormality is seen. IMPRESSION: ORIF of proximal left femoral fracture. Electronically Signed   By: Inez Catalina M.D.   On: 08/01/2018 09:15   Dg Hip Operative  Unilat W Or W/o Pelvis Left  Result Date: 08/01/2018 CLINICAL DATA:  Intratrochanteric left femoral fracture EXAM: DG C-ARM 61-120 MIN; OPERATIVE LEFT HIP WITH PELVIS COMPARISON:  None. FLUOROSCOPY TIME:  Radiation Exposure Index (as provided by the fluoroscopic device): Not available If the device does not provide the exposure index: Fluoroscopy Time:  26 seconds Number of Acquired Images:  2 FINDINGS: Proximal fixation rod and fixation screw are noted with near anatomic alignment of the fracture fragments. No other focal abnormality is seen. IMPRESSION: ORIF of proximal left femoral fracture. Electronically Signed   By: Inez Catalina M.D.   On: 08/01/2018 09:15   Dg Hip Unilat W Or Wo Pelvis 2-3 Views Left  Result Date: 07/30/2018 CLINICAL DATA:  84 year old male status post fall at 1930 hours yesterday. Left hip pain. EXAM: DG HIP (WITH OR WITHOUT PELVIS) 2-3V LEFT COMPARISON:  Pelvis and right hip series 01/25/2018. FINDINGS: Comminuted intertrochanteric fracture of the proximal left femur with mild varus angulation. Femoral heads remain normally located. Hip joint spaces remain normal. Grossly intact proximal right femur. No acute pelvic fracture identified. Calcified aortoiliac atherosclerosis. Negative visible bowel gas pattern. IMPRESSION: Comminuted left femur intertrochanteric fracture with minimal varus angulation. Electronically Signed   By: Genevie Ann M.D.   On: 07/30/2018 10:26     Phillips Climes M.D on 08/01/2018 at 2:19 PM  Between 7am to 7pm - Pager - [phone]  After 7pm go to [email or web address] - password [facility]  [facility] -  Office  [phone]

## 2018-08-01 NOTE — Progress Notes (Signed)
Pt remains agitated - no changes - multiple attempts to reorient pt - 5N updated on pt transfer status

## 2018-08-01 NOTE — Op Note (Signed)
DATE OF SURGERY:  08/01/2018  TIME: 9:12 AM  PATIENT NAME:  [name]     AGE: 84 y.o.  PRE-OPERATIVE DIAGNOSIS:  Left troch fracture  POST-OPERATIVE DIAGNOSIS:  SAME  PROCEDURE:  INTRAMEDULLARY (IM) NAIL INTERTROCHANTRIC  SURGEON:  Newt Minion  ASSISTANT:  none.  OPERATIVE IMPLANTS: Synthes trochanteric femoral nail with interlocking helical blade, locked proximally and distally.  PREOPERATIVE INDICATIONS:  [name] is a 84 y.o. year old who fell and suffered a hip fracture. He was brought into the ER and then admitted and optimized and then elected for surgical intervention.    The risks benefits and alternatives were discussed with the patient including but not limited to the risks of nonoperative treatment, versus surgical intervention including infection, bleeding, nerve injury, malunion, nonunion, hardware prominence, hardware failure, need for hardware removal, blood clots, cardiopulmonary complications, morbidity, mortality, among others, and they were willing to proceed.    OPERATIVE PROCEDURE:  The patient was brought to the operating room and placed in the supine position on the [place] fracture table. General anesthesia was administered. He was placed on the fracture table.  Closed reduction was performed under C-arm guidance. Time out was then performed after sterile prep with Duraprep and draped into a sterile field with the shower curtain. He received preoperative antibiotics.  Incision was made proximal to the greater trochanter. A guidewire was placed centered on the greater trochanter. Confirmation was made on AP and lateral views. The femoral canal was then reamed over the wire. Wire and reamer were removed and the nail was inserted.  A guidepin was placed through the jig into the femoral head close to the center center position of the femoral head. The helical screw length was measured the cortex was reamed and the helical screw was inserted. C-arm was  used to verify position of the spiral blade.  The compression screw proximally lock the spiral blade.  The distal interlock was placed using the jig.  Instruments were removed and final C-arm pictures AP and lateral were obtained.  The wounds were irrigated  and closed with Vicryl followed by staples and Mepilex dressing.  Weightbearing as tolerated.  Patient's procedure was complicated after completion of procedure patient was being waking up when his heart rate went up and blood pressure dropped, a CODE BLUE was initiated.  I returned to the room and anesthesia return to the room.  With IV injections patient returned to a normal rate and rhythm with restoration of his blood pressure.  I communicated this event with the family.  Plan to return back to skilled nursing.  Continue current anticoagulation therapy.  Weightbearing as tolerated.  I will follow-up in the office in 2 weeks.  Newt Minion

## 2018-08-01 NOTE — Progress Notes (Signed)
Pt agitated on arrival - pulling at lines- attempting to spit at nurses, grabbing and squeezing RN & NT hands. Pt only oriented to name.

## 2018-08-01 NOTE — Anesthesia Procedure Notes (Signed)
Procedure Name: Intubation Date/Time: 08/01/2018 8:13 AM Performed by: Adalberto Ill, CRNA Pre-anesthesia Checklist: Patient identified, Emergency Drugs available, Suction available, Patient being monitored and Timeout performed Patient Re-evaluated:Patient Re-evaluated prior to induction Oxygen Delivery Method: Circle system utilized Preoxygenation: Pre-oxygenation with 100% oxygen Induction Type: IV induction Ventilation: Mask ventilation without difficulty Laryngoscope Size: Miller and 2 Grade View: Grade I Tube type: Oral Tube size: 7.0 mm Number of attempts: 1 (very brief) Airway Equipment and Method: Stylet Placement Confirmation: ETT inserted through vocal cords under direct vision,  positive ETCO2 and breath sounds checked- equal and bilateral Secured at: 22 cm Tube secured with: Tape Dental Injury: Teeth and Oropharynx as per pre-operative assessment

## 2018-08-01 NOTE — Progress Notes (Signed)
Pt less agitated - remains only oriented to name  - see pain meds

## 2018-08-02 DIAGNOSIS — S72142A Displaced intertrochanteric fracture of left femur, initial encounter for closed fracture: Principal | ICD-10-CM

## 2018-08-02 LAB — BASIC METABOLIC PANEL
Anion gap: 13 (ref 5–15)
BUN: 31 mg/dL — AB (ref 8–23)
CHLORIDE: 105 mmol/L (ref 98–111)
CO2: 22 mmol/L (ref 22–32)
Calcium: 8.3 mg/dL — ABNORMAL LOW (ref 8.9–10.3)
Creatinine, Ser: 1.22 mg/dL (ref 0.61–1.24)
GFR calc Af Amer: >60 mL/min (ref >60)
GFR calc non Af Amer: 52 mL/min — ABNORMAL LOW (ref >60)
GLUCOSE: 135 mg/dL — AB (ref 70–99)
POTASSIUM: 3.8 mmol/L (ref 3.5–5.1)
Sodium: 140 mmol/L (ref 135–145)

## 2018-08-02 LAB — POCT I-STAT 3, ART BLOOD GAS (G3+)
BICARBONATE: 25.5 mmol/L (ref 20.0–28.0)
O2 Saturation: 100 %
TCO2: 27 mmol/L (ref 22–32)
pCO2 arterial: 42.7 mmHg (ref 32.0–48.0)
pH, Arterial: 7.38 (ref 7.350–7.450)
pO2, Arterial: 484 mmHg — ABNORMAL HIGH (ref 83.0–108.0)

## 2018-08-02 LAB — CBC
HCT: 31.1 % — ABNORMAL LOW (ref 39.0–52.0)
HEMOGLOBIN: 9.9 g/dL — AB (ref 13.0–17.0)
MCH: 30.4 pg (ref 26.0–34.0)
MCHC: 31.8 g/dL (ref 30.0–36.0)
MCV: 95.4 fL (ref 78.0–100.0)
Platelets: 154 10*3/uL (ref 150–400)
RBC: 3.26 MIL/uL — AB (ref 4.22–5.81)
RDW: 14 % (ref 11.5–15.5)
WBC: 9.8 10*3/uL (ref 4.0–10.5)

## 2018-08-02 MED ORDER — METOPROLOL TARTRATE 5 MG/5ML IV SOLN
5.0000 mg | Freq: Once | INTRAVENOUS | Status: AC
  Administered 2018-08-02: 5 mg via INTRAVENOUS
  Filled 2018-08-02: qty 5

## 2018-08-02 MED ORDER — METOPROLOL TARTRATE 25 MG PO TABS
25.0000 mg | ORAL_TABLET | Freq: Three times a day (TID) | ORAL | Status: DC
  Administered 2018-08-02 – 2018-08-04 (×8): 25 mg via ORAL
  Filled 2018-08-02 (×8): qty 1

## 2018-08-02 MED ORDER — HALOPERIDOL LACTATE 5 MG/ML IJ SOLN
4.0000 mg | Freq: Once | INTRAMUSCULAR | Status: AC
  Administered 2018-08-02: 4 mg via INTRAVENOUS

## 2018-08-02 MED ORDER — QUETIAPINE FUMARATE 25 MG PO TABS
25.0000 mg | ORAL_TABLET | Freq: Every day | ORAL | Status: DC
  Administered 2018-08-02 – 2018-08-03 (×2): 25 mg via ORAL
  Filled 2018-08-02 (×2): qty 1

## 2018-08-02 NOTE — Progress Notes (Signed)
Pt with HR frequently increasing to 150-170's (SVT) while calm and at rest. HR recovers to 80-100 (NSR). Pt remains confused, denies any distress. No obvious distress noted. BP 108/89, Sats 98% RA. Dr. Myna Hidalgo made aware, will obtain EKG as ordered, will monitor.

## 2018-08-02 NOTE — Progress Notes (Signed)
PROGRESS NOTE                                                                                                                                                                                                             Patient Demographics:    [name], is a 84 y.o. male, DOB - March 27, 1930, QVZ:[id number]  Admit date - 07/30/2018   Admitting Physician [name], DO  Outpatient Primary MD for the patient is [name], [place]  LOS - 3   Chief Complaint  Patient presents with  . Hip Pain       Brief Narrative   84 y.o. male with medical history significant for dementia, coronary artery disease, RLE DVT on Coumadin, OSA, type 2 diabetes, diabetic polyneuropathy, hypertension, hyperlipidemia, who presented to [facility] ED post fall  at SNF, work-up significant for left femur fracture, as well was noted to have paroxysmal A. fib which is a new diagnosis, patient went for surgical repair 08/01/2018,   Subjective:    Austin Hoshino today more confused, with delirium.   Assessment  & Plan :    Principal Problem:   Displaced intertrochanteric fracture of left femur, initial encounter for closed fracture (HCC)   Left comminuted femur intertrochanteric fracture -Due to mechanical fall, orthopedic consult greatly appreciated, overall high risk for surgery, but no further work-up needed before surgery, patient went for surgical repair 08/01/2018 .  Acute metabolic encephalopathy most likely secondary to acute illness -Apparently patient was agitated and confused on presentation, and significantly improved since admission -Patient again today appears to be confused, agitated with some delirium postoperatively, required as needed Haldol, will start on Seroquel nightly  Paroxysmal A. fib with RVR -Patient was noted to have intermittent episodes of paroxysmal A. fib during these episodes heart rate goes are controlled in the 130s and 140s,  otherwise he is in normal sinus rhythm, continue with telemetry monitoring, 2D echo with no significant valvular disease, or left atrium enlargement or regional wall motion abnormality. -Cardiology consult appreciated -on Warfarin for history of DVT, he did receive vitamin K to reverse it, started on Eliquis -Apparently he is in A. fib with RVR overnight, heart rate in the 120s to 130s, will start on metoprolol 25 mg every 8 hour.   Hypertension -continue with amlodipine, I have ordered PRN hydralazine,  Hyperlipidemia -  Continue Lipitor  Chronic depression/anxiety - Continue Lexapro   BPH - On finasteride  Type 2 diabetes -Hold home dose metformin, continue with insulin sliding scale during hospital stay  Hypokalemia -Repleted  History of RLE DVT on Coumadin -On warfarin prior to admission, has been changed to Eliquis  Dementia - Continue donepezil  UTI -Continue with Rocephin.    Code Status : DNR  Family Communication  : None at bedside  Disposition Plan  : will need SNF  Consults  :  Ortho  Procedures  : INTRAMEDULLARY (IM) NAIL INTERTROCHANTRIC of left hip by Dr. Sharol Given 08/01/2018  DVT Prophylaxis  :  Eliquis  Lab Results  Component Value Date   PLT 154 08/02/2018    Antibiotics  :    Anti-infectives (From admission, onward)   Start     Dose/Rate Route Frequency Ordered Stop   08/01/18 1500  ceFAZolin (ANCEF) IVPB 1 g/50 mL premix     1 g 100 mL/hr over 30 Minutes Intravenous Every 6 hours 08/01/18 1407 08/02/18 0553   07/31/18 1000  cefTRIAXone (ROCEPHIN) 1 g in sodium chloride 0.9 % 100 mL IVPB     1 g 200 mL/hr over 30 Minutes Intravenous Daily 07/31/18 0819     07/31/18 0945  ceFAZolin (ANCEF) IVPB 2g/100 mL premix     2 g 200 mL/hr over 30 Minutes Intravenous On call to O.R. 07/31/18 0943 08/01/18 0559        Objective:   Vitals:   08/02/18 0327 08/02/18 0521 08/02/18 0930 08/02/18 0931  BP: 116/85  123/89 123/89  Pulse: (!) 126 (!)  115 (!) 101 (!) 105  Resp: 19 (!) 22  (!) 21  Temp: 98.7 F (37.1 C)   98.5 F (36.9 C)  TempSrc: Oral   Oral  SpO2: 98% 99%  99%  Weight:      Height:        Wt Readings from Last 3 Encounters:  07/31/18 98.9 kg (218 lb)  01/25/18 99.2 kg (218 lb 11.2 oz)  06/30/17 92.1 kg (203 lb)     Intake/Output Summary (Last 24 hours) at 08/02/2018 1417 Last data filed at 08/02/2018 1020 Gross per 24 hour  Intake 403.2 ml  Output -  Net 403.2 ml     Physical Exam  Awake, confused, not answering questions  symmetrical Chest wall movement, Good air movement bilaterally, CTAB Irregularly irregular, tachycardic,No Gallops,Rubs or new Murmurs, No Parasternal Heave +ve B.Sounds, Abd Soft, No tenderness, No rebound - guarding or rigidity. No Cyanosis, Clubbing or edema, No new Rash or bruise        Data Review:    CBC Recent Labs  Lab 07/30/18 1120 08/01/18 0704 08/02/18 0513  WBC 8.7 8.5 9.8  HGB 12.7* 12.7* 9.9*  HCT 37.8* 39.1 31.1*  PLT 121* 141* 154  MCV 91.3 93.1 95.4  MCH 30.7 30.2 30.4  MCHC 33.6 32.5 31.8  RDW 14.0 14.0 14.0  LYMPHSABS 2.9  --   --   MONOABS 0.4  --   --   EOSABS 0.0  --   --   BASOSABS 0.0  --   --     Chemistries  Recent Labs  Lab 07/30/18 1120 08/01/18 0704 08/02/18 0513  NA 139 140 140  K 3.4* 3.9 3.8  CL 101 105 105  CO2 30 25 22   GLUCOSE 123* 115* 135*  BUN 18 22 31*  CREATININE 1.08 1.16 1.22  CALCIUM 8.9 8.6* 8.3*  AST 25  --   --  ALT 16  --   --   ALKPHOS 70  --   --   BILITOT 1.3*  --   --    ------------------------------------------------------------------------------------------------------------------ No results for input(s): CHOL, HDL, LDLCALC, TRIG, CHOLHDL, LDLDIRECT in the last 72 hours.  Lab Results  Component Value Date   HGBA1C 6.8 (H) 01/25/2018   ------------------------------------------------------------------------------------------------------------------ No results for input(s): TSH, T4TOTAL,  T3FREE, THYROIDAB in the last 72 hours.  Invalid input(s): FREET3 ------------------------------------------------------------------------------------------------------------------ No results for input(s): VITAMINB12, FOLATE, FERRITIN, TIBC, IRON, RETICCTPCT in the last 72 hours.  Coagulation profile Recent Labs  Lab 07/30/18 1120 07/31/18 0748  INR 1.83 1.38    No results for input(s): DDIMER in the last 72 hours.  Cardiac Enzymes No results for input(s): CKMB, TROPONINI, MYOGLOBIN in the last 168 hours.  Invalid input(s): CK ------------------------------------------------------------------------------------------------------------------    Component Value Date/Time   BNP 141.1 (H) 09/19/2015 1355    Inpatient Medications  Scheduled Meds: . sodium chloride   Intravenous Once  . amLODipine  2.5 mg Oral Daily  . apixaban  5 mg Oral BID  . atorvastatin  20 mg Oral q1800  . docusate sodium  100 mg Oral BID  . donepezil  10 mg Oral QHS  . finasteride  5 mg Oral Daily  . guaiFENesin  1,200 mg Oral BID  . haloperidol lactate  4 mg Intravenous Once  . Melatonin  1 tablet Oral QHS  . metoprolol tartrate  25 mg Oral Q8H  . pantoprazole  40 mg Oral Daily  . polyethylene glycol  17 g Oral Daily  . QUEtiapine  25 mg Oral QHS  . senna  1 tablet Oral Daily   Continuous Infusions: . sodium chloride 75 mL/hr at 08/01/18 1309  . sodium chloride Stopped (08/01/18 1442)  . cefTRIAXone (ROCEPHIN)  IV 1 g (08/02/18 0927)  . methocarbamol (ROBAXIN) IV     PRN Meds:.acetaminophen, bisacodyl, haloperidol lactate, hydrALAZINE, hydrALAZINE, hydroxypropyl methylcellulose / hypromellose, magnesium citrate, methocarbamol **OR** methocarbamol (ROBAXIN) IV, metoCLOPramide **OR** metoCLOPramide (REGLAN) injection, ondansetron **OR** ondansetron (ZOFRAN) IV, polyethylene glycol  Micro Results Recent Results (from the past 240 hour(s))  Surgical pcr screen     Status: None   Collection Time:  07/30/18 11:26 PM  Result Value Ref Range Status   MRSA, PCR NEGATIVE NEGATIVE Final   Staphylococcus aureus NEGATIVE NEGATIVE Final    Comment: (NOTE) The Xpert SA Assay (FDA approved for NASAL specimens in patients 29 years of age and older), is one component of a comprehensive surveillance program. It is not intended to diagnose infection nor to guide or monitor treatment. Performed at [facility], [place] [address] [place], [place] [address]   MRSA PCR Screening     Status: None   Collection Time: 08/01/18 12:26 PM  Result Value Ref Range Status   MRSA by PCR NEGATIVE NEGATIVE Final    Comment:        The GeneXpert MRSA Assay (FDA approved for NASAL specimens only), is one component of a comprehensive MRSA colonization surveillance program. It is not intended to diagnose MRSA infection nor to guide or monitor treatment for MRSA infections. Performed at [facility], [place] [address], [place], [place] [address]     Radiology Reports Dg Chest 1 View  Result Date: 07/30/2018 CLINICAL DATA:  84 year old male status post fall with proximal left femur fracture. EXAM: CHEST  1 VIEW COMPARISON:  01/25/2018 and earlier. FINDINGS: AP supine view at 1008 hours. Stable cardiomegaly and mediastinal contours.  Visualized tracheal air column is within normal limits. Stable lung volumes. No pneumothorax, pulmonary edema, pleural effusion or confluent pulmonary opacity. IMPRESSION: Chronic cardiomegaly.  No acute cardiopulmonary abnormality. Electronically Signed   By: Genevie Ann M.D.   On: 07/30/2018 10:27   Ct Head Wo Contrast  Result Date: 07/30/2018 CLINICAL DATA:  Fall last night broken hip. EXAM: CT HEAD WITHOUT CONTRAST CT CERVICAL SPINE WITHOUT CONTRAST TECHNIQUE: Multidetector CT imaging of the head and cervical spine was performed following the standard protocol without intravenous contrast. Multiplanar CT image reconstructions of the cervical spine were also generated.  COMPARISON:  CT head 01/25/2018 FINDINGS: CT HEAD FINDINGS Brain: Image quality degraded by motion. Multiple images repeated due to motion Moderate to advanced atrophy. Moderate chronic white matter changes. No acute infarct, hemorrhage, or mass. Negative for subdural hematoma. Vascular: Negative for hyperdense vessel Skull: Negative for fracture Sinuses/Orbits: Paranasal sinuses clear. Bilateral cataract surgery. Other: None CT CERVICAL SPINE FINDINGS Alignment: 2 mm anterolisthesis C3-4 unchanged from prior neck CT of 08/19/2016. Extensive facet degeneration at this level. Mild anterolisthesis at C6-7 also unchanged. Skull base and vertebrae: Negative for acute fracture. Negative for mass lesion. Soft tissues and spinal canal: Cystic nodule right lobe of the thyroid measuring approximately 23 x 38 mm on axial images. Coarse calcification along the superior margin of the nodule. No adenopathy in the neck. Disc levels: Marked facet degeneration at C3-4. Diffuse uncinate spurring with moderate foraminal encroachment bilaterally and mild spinal stenosis. Moderate foraminal narrowing due to spurring and C5-6 and C6-7. Mild foraminal narrowing bilaterally at C4-5. Upper chest: Negative Other: None IMPRESSION: Image quality degraded by motion Atrophy and chronic microvascular ischemia. No acute intracranial abnormality 1. Extensive cervical spine degenerative change without fracture. 2. Right thyroid cystic nodule 23 x 28 mm. Electronically Signed   By: Franchot Gallo M.D.   On: 07/30/2018 12:58   Ct Cervical Spine Wo Contrast  Result Date: 07/30/2018 CLINICAL DATA:  Fall last night broken hip. EXAM: CT HEAD WITHOUT CONTRAST CT CERVICAL SPINE WITHOUT CONTRAST TECHNIQUE: Multidetector CT imaging of the head and cervical spine was performed following the standard protocol without intravenous contrast. Multiplanar CT image reconstructions of the cervical spine were also generated. COMPARISON:  CT head 01/25/2018  FINDINGS: CT HEAD FINDINGS Brain: Image quality degraded by motion. Multiple images repeated due to motion Moderate to advanced atrophy. Moderate chronic white matter changes. No acute infarct, hemorrhage, or mass. Negative for subdural hematoma. Vascular: Negative for hyperdense vessel Skull: Negative for fracture Sinuses/Orbits: Paranasal sinuses clear. Bilateral cataract surgery. Other: None CT CERVICAL SPINE FINDINGS Alignment: 2 mm anterolisthesis C3-4 unchanged from prior neck CT of 08/19/2016. Extensive facet degeneration at this level. Mild anterolisthesis at C6-7 also unchanged. Skull base and vertebrae: Negative for acute fracture. Negative for mass lesion. Soft tissues and spinal canal: Cystic nodule right lobe of the thyroid measuring approximately 23 x 38 mm on axial images. Coarse calcification along the superior margin of the nodule. No adenopathy in the neck. Disc levels: Marked facet degeneration at C3-4. Diffuse uncinate spurring with moderate foraminal encroachment bilaterally and mild spinal stenosis. Moderate foraminal narrowing due to spurring and C5-6 and C6-7. Mild foraminal narrowing bilaterally at C4-5. Upper chest: Negative Other: None IMPRESSION: Image quality degraded by motion Atrophy and chronic microvascular ischemia. No acute intracranial abnormality 1. Extensive cervical spine degenerative change without fracture. 2. Right thyroid cystic nodule 23 x 28 mm. Electronically Signed   By: Franchot Gallo M.D.   On: 07/30/2018 12:58  Dg C-arm 1-60 Min  Result Date: 08/01/2018 CLINICAL DATA:  Intratrochanteric left femoral fracture EXAM: DG C-ARM 61-120 MIN; OPERATIVE LEFT HIP WITH PELVIS COMPARISON:  None. FLUOROSCOPY TIME:  Radiation Exposure Index (as provided by the fluoroscopic device): Not available If the device does not provide the exposure index: Fluoroscopy Time:  26 seconds Number of Acquired Images:  2 FINDINGS: Proximal fixation rod and fixation screw are noted with near  anatomic alignment of the fracture fragments. No other focal abnormality is seen. IMPRESSION: ORIF of proximal left femoral fracture. Electronically Signed   By: Inez Catalina M.D.   On: 08/01/2018 09:15   Dg Hip Operative Unilat W Or W/o Pelvis Left  Result Date: 08/01/2018 CLINICAL DATA:  Intratrochanteric left femoral fracture EXAM: DG C-ARM 61-120 MIN; OPERATIVE LEFT HIP WITH PELVIS COMPARISON:  None. FLUOROSCOPY TIME:  Radiation Exposure Index (as provided by the fluoroscopic device): Not available If the device does not provide the exposure index: Fluoroscopy Time:  26 seconds Number of Acquired Images:  2 FINDINGS: Proximal fixation rod and fixation screw are noted with near anatomic alignment of the fracture fragments. No other focal abnormality is seen. IMPRESSION: ORIF of proximal left femoral fracture. Electronically Signed   By: Inez Catalina M.D.   On: 08/01/2018 09:15   Dg Hip Unilat W Or Wo Pelvis 2-3 Views Left  Result Date: 07/30/2018 CLINICAL DATA:  84 year old male status post fall at 1930 hours yesterday. Left hip pain. EXAM: DG HIP (WITH OR WITHOUT PELVIS) 2-3V LEFT COMPARISON:  Pelvis and right hip series 01/25/2018. FINDINGS: Comminuted intertrochanteric fracture of the proximal left femur with mild varus angulation. Femoral heads remain normally located. Hip joint spaces remain normal. Grossly intact proximal right femur. No acute pelvic fracture identified. Calcified aortoiliac atherosclerosis. Negative visible bowel gas pattern. IMPRESSION: Comminuted left femur intertrochanteric fracture with minimal varus angulation. Electronically Signed   By: Genevie Ann M.D.   On: 07/30/2018 10:26     Phillips Climes M.D on 08/02/2018 at 2:17 PM  Between 7am to 7pm - Pager - [phone]  After 7pm go to [email or web address] - password [email or web address]  [facility] -  Office  [phone]

## 2018-08-02 NOTE — Progress Notes (Signed)
Patient ID: [name], male   DOB: 1930/02/23, 84 y.o.   MRN: [id number] Postoperative day 1 intertrochanteric hip fracture.  Patient has already been up on the leg.  He is confused this morning, plan for discharge to skilled nursing when stable medically.  Weightbearing as tolerated on the left hip no restrictions.

## 2018-08-02 NOTE — Progress Notes (Signed)
PT Cancellation Note  Patient Details Name: [name] MRN: [id number] DOB: 02-04-1930   Cancelled Treatment:    Reason Eval/Treat Not Completed: Other (comment) Per RN, pt agitated and combative. Pt's RN requesting to hold PT until tomorrow. Will follow up as schedule allows.   Leighton Ruff, PT, DPT  Acute Rehabilitation Services  Pager: [phone]  Rudean Hitt 08/02/2018, 1:44 PM

## 2018-08-03 ENCOUNTER — Encounter (HOSPITAL_COMMUNITY): Payer: Self-pay | Admitting: Orthopedic Surgery

## 2018-08-03 LAB — CBC
HCT: 27.5 % — ABNORMAL LOW (ref 39.0–52.0)
HEMOGLOBIN: 9 g/dL — AB (ref 13.0–17.0)
MCH: 30.4 pg (ref 26.0–34.0)
MCHC: 32.7 g/dL (ref 30.0–36.0)
MCV: 92.9 fL (ref 78.0–100.0)
Platelets: 144 10*3/uL — ABNORMAL LOW (ref 150–400)
RBC: 2.96 MIL/uL — ABNORMAL LOW (ref 4.22–5.81)
RDW: 13.9 % (ref 11.5–15.5)
WBC: 7.9 10*3/uL (ref 4.0–10.5)

## 2018-08-03 LAB — BASIC METABOLIC PANEL
Anion gap: 7 (ref 5–15)
BUN: 20 mg/dL (ref 8–23)
CALCIUM: 8.3 mg/dL — AB (ref 8.9–10.3)
CO2: 28 mmol/L (ref 22–32)
CREATININE: 0.97 mg/dL (ref 0.61–1.24)
Chloride: 108 mmol/L (ref 98–111)
GFR calc non Af Amer: >60 mL/min (ref >60)
Glucose, Bld: 128 mg/dL — ABNORMAL HIGH (ref 70–99)
Potassium: 3.2 mmol/L — ABNORMAL LOW (ref 3.5–5.1)
SODIUM: 143 mmol/L (ref 135–145)

## 2018-08-03 LAB — GLUCOSE, CAPILLARY: GLUCOSE-CAPILLARY: 127 mg/dL — AB (ref 70–99)

## 2018-08-03 LAB — MAGNESIUM: MAGNESIUM: 1.9 mg/dL (ref 1.7–2.4)

## 2018-08-03 MED ORDER — POTASSIUM CHLORIDE CRYS ER 20 MEQ PO TBCR
40.0000 meq | EXTENDED_RELEASE_TABLET | ORAL | Status: AC
Start: 2018-08-03 — End: 2018-08-03
  Administered 2018-08-03 (×2): 40 meq via ORAL
  Filled 2018-08-03 (×2): qty 2

## 2018-08-03 NOTE — Progress Notes (Addendum)
PROGRESS NOTE                                                                                                                                                                                                             Patient Demographics:    [name], is a 84 y.o. male, DOB - Mar 20, 1930, RCV:[id number]  Admit date - 07/30/2018   Admitting Physician [name], DO  Outpatient Primary MD for the patient is [name], [place]  LOS - 4   Chief Complaint  Patient presents with  . Hip Pain       Brief Narrative   84 y.o. male with medical history significant for dementia, coronary artery disease, RLE DVT on Coumadin, OSA, type 2 diabetes, diabetic polyneuropathy, hypertension, hyperlipidemia, who presented to [facility] ED post fall  at SNF, work-up significant for left femur fracture, as well was noted to have paroxysmal A. fib which is a new diagnosis, patient went for surgical repair 08/01/2018, postoperative course complicated by hospital delirium.   Subjective:    Makoa Schutter today less confused, more appropriate, participated with PT.   Assessment  & Plan :    Principal Problem:   Displaced intertrochanteric fracture of left femur, initial encounter for closed fracture (HCC)   Left comminuted femur intertrochanteric fracture -Due to mechanical fall, orthopedic consult greatly appreciated, overall high risk for surgery, but no further work-up needed before surgery, patient went for surgical repair 08/01/2018 . -Been seen by PT, will need SNF placement  Acute metabolic encephalopathy most likely secondary to acute illness -Apparently patient was agitated and confused on presentation, and significantly improved since admission -Something significant delirium yesterday, required as needed Haldol, and received Seroquel overnight, significantly improved today.  Paroxysmal A. fib with RVR -Patient was noted to have intermittent  episodes of paroxysmal A. fib during these episodes heart rate goes are controlled in the 130s and 140s, otherwise he is in normal sinus rhythm, continue with telemetry monitoring, 2D echo with no significant valvular disease, or left atrium enlargement or regional wall motion abnormality. -Cardiology consult appreciated -on Warfarin for history of DVT, he did receive vitamin K to reverse it, started on Eliquis -Paroxysmal A. fib, but overall heart rate has been controlled on metoprolol, he is in and out A. fib over last 24 hours .  Hypokalemia -Repleted,  recheck in a.m.  Hypertension -continue with amlodipine, I have ordered PRN hydralazine,  Hyperlipidemia -Continue Lipitor  Chronic depression/anxiety - Continue Lexapro   BPH - On finasteride  Type 2 diabetes -Hold home dose metformin, continue with insulin sliding scale during hospital stay  Hypokalemia -Repleted  History of RLE DVT on Coumadin -On warfarin prior to admission, has been changed to Eliquis  Dementia - Continue donepezil  UTI -Treated with Rocephin.    Code Status : DNR  Family Communication  : None at bedside  Disposition Plan  : SNF, hopefully in 24 hours  Consults  :  Ortho, cardiology  Procedures  : INTRAMEDULLARY (IM) NAIL INTERTROCHANTRIC of left hip by Dr. Sharol Given 08/01/2018  DVT Prophylaxis  :  Eliquis  Lab Results  Component Value Date   PLT 144 (L) 08/03/2018    Antibiotics  :    Anti-infectives (From admission, onward)   Start     Dose/Rate Route Frequency Ordered Stop   08/01/18 1500  ceFAZolin (ANCEF) IVPB 1 g/50 mL premix     1 g 100 mL/hr over 30 Minutes Intravenous Every 6 hours 08/01/18 1407 08/02/18 0553   07/31/18 1000  cefTRIAXone (ROCEPHIN) 1 g in sodium chloride 0.9 % 100 mL IVPB     1 g 200 mL/hr over 30 Minutes Intravenous Daily 07/31/18 0819     07/31/18 0945  ceFAZolin (ANCEF) IVPB 2g/100 mL premix     2 g 200 mL/hr over 30 Minutes Intravenous On call to  O.R. 07/31/18 0943 08/01/18 0559        Objective:   Vitals:   08/03/18 0701 08/03/18 0941 08/03/18 1130 08/03/18 1314  BP: (!) 158/84 (!) 152/92 (!) 146/85 (!) 143/83  Pulse: 64 77 73 78  Resp: (!) 32  (!) 24 (!) 22  Temp: 99.2 F (37.3 C)  98.7 F (37.1 C)   TempSrc: Oral  Oral   SpO2: 95% 97% 97% 93%  Weight:      Height:        Wt Readings from Last 3 Encounters:  07/31/18 98.9 kg (218 lb)  01/25/18 99.2 kg (218 lb 11.2 oz)  06/30/17 92.1 kg (203 lb)     Intake/Output Summary (Last 24 hours) at 08/03/2018 1452 Last data filed at 08/03/2018 0636 Gross per 24 hour  Intake 120 ml  Output 850 ml  Net -730 ml     Physical Exam  Awake Alert, confused, but pleasant  symmetrical Chest wall movement, Good air movement bilaterally, CTAB Irregular irregular,No Gallops,Rubs or new Murmurs, No Parasternal Heave +ve B.Sounds, Abd Soft, No tenderness, No rebound - guarding or rigidity. No Cyanosis, Clubbing or edema, No new Rash or bruise         Data Review:    CBC Recent Labs  Lab 07/30/18 1120 08/01/18 0704 08/02/18 0513 08/03/18 0709  WBC 8.7 8.5 9.8 7.9  HGB 12.7* 12.7* 9.9* 9.0*  HCT 37.8* 39.1 31.1* 27.5*  PLT 121* 141* 154 144*  MCV 91.3 93.1 95.4 92.9  MCH 30.7 30.2 30.4 30.4  MCHC 33.6 32.5 31.8 32.7  RDW 14.0 14.0 14.0 13.9  LYMPHSABS 2.9  --   --   --   MONOABS 0.4  --   --   --   EOSABS 0.0  --   --   --   BASOSABS 0.0  --   --   --     Chemistries  Recent Labs  Lab 07/30/18 1120 08/01/18 0704 08/02/18 0513 08/03/18  0709  NA 139 140 140 143  K 3.4* 3.9 3.8 3.2*  CL 101 105 105 108  CO2 30 25 22 28   GLUCOSE 123* 115* 135* 128*  BUN 18 22 31* 20  CREATININE 1.08 1.16 1.22 0.97  CALCIUM 8.9 8.6* 8.3* 8.3*  MG  --   --   --  1.9  AST 25  --   --   --   ALT 16  --   --   --   ALKPHOS 70  --   --   --   BILITOT 1.3*  --   --   --     ------------------------------------------------------------------------------------------------------------------ No results for input(s): CHOL, HDL, LDLCALC, TRIG, CHOLHDL, LDLDIRECT in the last 72 hours.  Lab Results  Component Value Date   HGBA1C 6.8 (H) 01/25/2018   ------------------------------------------------------------------------------------------------------------------ No results for input(s): TSH, T4TOTAL, T3FREE, THYROIDAB in the last 72 hours.  Invalid input(s): FREET3 ------------------------------------------------------------------------------------------------------------------ No results for input(s): VITAMINB12, FOLATE, FERRITIN, TIBC, IRON, RETICCTPCT in the last 72 hours.  Coagulation profile Recent Labs  Lab 07/30/18 1120 07/31/18 0748  INR 1.83 1.38    No results for input(s): DDIMER in the last 72 hours.  Cardiac Enzymes No results for input(s): CKMB, TROPONINI, MYOGLOBIN in the last 168 hours.  Invalid input(s): CK ------------------------------------------------------------------------------------------------------------------    Component Value Date/Time   BNP 141.1 (H) 09/19/2015 1355    Inpatient Medications  Scheduled Meds: . sodium chloride   Intravenous Once  . amLODipine  2.5 mg Oral Daily  . apixaban  5 mg Oral BID  . atorvastatin  20 mg Oral q1800  . docusate sodium  100 mg Oral BID  . donepezil  10 mg Oral QHS  . finasteride  5 mg Oral Daily  . guaiFENesin  1,200 mg Oral BID  . Melatonin  1 tablet Oral QHS  . metoprolol tartrate  25 mg Oral Q8H  . pantoprazole  40 mg Oral Daily  . polyethylene glycol  17 g Oral Daily  . QUEtiapine  25 mg Oral QHS  . senna  1 tablet Oral Daily   Continuous Infusions: . sodium chloride 75 mL/hr at 08/01/18 1309  . sodium chloride Stopped (08/01/18 1442)  . cefTRIAXone (ROCEPHIN)  IV 1 g (08/03/18 1103)  . methocarbamol (ROBAXIN) IV     PRN Meds:.acetaminophen, bisacodyl, haloperidol  lactate, hydrALAZINE, hydrALAZINE, hydroxypropyl methylcellulose / hypromellose, magnesium citrate, methocarbamol **OR** methocarbamol (ROBAXIN) IV, metoCLOPramide **OR** metoCLOPramide (REGLAN) injection, ondansetron **OR** ondansetron (ZOFRAN) IV, polyethylene glycol  Micro Results Recent Results (from the past 240 hour(s))  Surgical pcr screen     Status: None   Collection Time: 07/30/18 11:26 PM  Result Value Ref Range Status   MRSA, PCR NEGATIVE NEGATIVE Final   Staphylococcus aureus NEGATIVE NEGATIVE Final    Comment: (NOTE) The Xpert SA Assay (FDA approved for NASAL specimens in patients 60 years of age and older), is one component of a comprehensive surveillance program. It is not intended to diagnose infection nor to guide or monitor treatment. Performed at [facility], [place] [address], [place], [place] [address]   MRSA PCR Screening     Status: None   Collection Time: 08/01/18 12:26 PM  Result Value Ref Range Status   MRSA by PCR NEGATIVE NEGATIVE Final    Comment:        The GeneXpert MRSA Assay (FDA approved for NASAL specimens only), is one component of a comprehensive MRSA colonization surveillance program. It is not intended to  diagnose MRSA infection nor to guide or monitor treatment for MRSA infections. Performed at [facility], [place] [address] [place], [place] [address]     Radiology Reports Dg Chest 1 View  Result Date: 07/30/2018 CLINICAL DATA:  84 year old male status post fall with proximal left femur fracture. EXAM: CHEST  1 VIEW COMPARISON:  01/25/2018 and earlier. FINDINGS: AP supine view at 1008 hours. Stable cardiomegaly and mediastinal contours. Visualized tracheal air column is within normal limits. Stable lung volumes. No pneumothorax, pulmonary edema, pleural effusion or confluent pulmonary opacity. IMPRESSION: Chronic cardiomegaly.  No acute cardiopulmonary abnormality. Electronically Signed   By: Genevie Ann M.D.   On: 07/30/2018 10:27    Ct Head Wo Contrast  Result Date: 07/30/2018 CLINICAL DATA:  Fall last night broken hip. EXAM: CT HEAD WITHOUT CONTRAST CT CERVICAL SPINE WITHOUT CONTRAST TECHNIQUE: Multidetector CT imaging of the head and cervical spine was performed following the standard protocol without intravenous contrast. Multiplanar CT image reconstructions of the cervical spine were also generated. COMPARISON:  CT head 01/25/2018 FINDINGS: CT HEAD FINDINGS Brain: Image quality degraded by motion. Multiple images repeated due to motion Moderate to advanced atrophy. Moderate chronic white matter changes. No acute infarct, hemorrhage, or mass. Negative for subdural hematoma. Vascular: Negative for hyperdense vessel Skull: Negative for fracture Sinuses/Orbits: Paranasal sinuses clear. Bilateral cataract surgery. Other: None CT CERVICAL SPINE FINDINGS Alignment: 2 mm anterolisthesis C3-4 unchanged from prior neck CT of 08/19/2016. Extensive facet degeneration at this level. Mild anterolisthesis at C6-7 also unchanged. Skull base and vertebrae: Negative for acute fracture. Negative for mass lesion. Soft tissues and spinal canal: Cystic nodule right lobe of the thyroid measuring approximately 23 x 38 mm on axial images. Coarse calcification along the superior margin of the nodule. No adenopathy in the neck. Disc levels: Marked facet degeneration at C3-4. Diffuse uncinate spurring with moderate foraminal encroachment bilaterally and mild spinal stenosis. Moderate foraminal narrowing due to spurring and C5-6 and C6-7. Mild foraminal narrowing bilaterally at C4-5. Upper chest: Negative Other: None IMPRESSION: Image quality degraded by motion Atrophy and chronic microvascular ischemia. No acute intracranial abnormality 1. Extensive cervical spine degenerative change without fracture. 2. Right thyroid cystic nodule 23 x 28 mm. Electronically Signed   By: Franchot Gallo M.D.   On: 07/30/2018 12:58   Ct Cervical Spine Wo Contrast  Result Date:  07/30/2018 CLINICAL DATA:  Fall last night broken hip. EXAM: CT HEAD WITHOUT CONTRAST CT CERVICAL SPINE WITHOUT CONTRAST TECHNIQUE: Multidetector CT imaging of the head and cervical spine was performed following the standard protocol without intravenous contrast. Multiplanar CT image reconstructions of the cervical spine were also generated. COMPARISON:  CT head 01/25/2018 FINDINGS: CT HEAD FINDINGS Brain: Image quality degraded by motion. Multiple images repeated due to motion Moderate to advanced atrophy. Moderate chronic white matter changes. No acute infarct, hemorrhage, or mass. Negative for subdural hematoma. Vascular: Negative for hyperdense vessel Skull: Negative for fracture Sinuses/Orbits: Paranasal sinuses clear. Bilateral cataract surgery. Other: None CT CERVICAL SPINE FINDINGS Alignment: 2 mm anterolisthesis C3-4 unchanged from prior neck CT of 08/19/2016. Extensive facet degeneration at this level. Mild anterolisthesis at C6-7 also unchanged. Skull base and vertebrae: Negative for acute fracture. Negative for mass lesion. Soft tissues and spinal canal: Cystic nodule right lobe of the thyroid measuring approximately 23 x 38 mm on axial images. Coarse calcification along the superior margin of the nodule. No adenopathy in the neck. Disc levels: Marked facet degeneration at C3-4. Diffuse uncinate spurring with moderate  foraminal encroachment bilaterally and mild spinal stenosis. Moderate foraminal narrowing due to spurring and C5-6 and C6-7. Mild foraminal narrowing bilaterally at C4-5. Upper chest: Negative Other: None IMPRESSION: Image quality degraded by motion Atrophy and chronic microvascular ischemia. No acute intracranial abnormality 1. Extensive cervical spine degenerative change without fracture. 2. Right thyroid cystic nodule 23 x 28 mm. Electronically Signed   By: Franchot Gallo M.D.   On: 07/30/2018 12:58   Dg C-arm 1-60 Min  Result Date: 08/01/2018 CLINICAL DATA:  Intratrochanteric left  femoral fracture EXAM: DG C-ARM 61-120 MIN; OPERATIVE LEFT HIP WITH PELVIS COMPARISON:  None. FLUOROSCOPY TIME:  Radiation Exposure Index (as provided by the fluoroscopic device): Not available If the device does not provide the exposure index: Fluoroscopy Time:  26 seconds Number of Acquired Images:  2 FINDINGS: Proximal fixation rod and fixation screw are noted with near anatomic alignment of the fracture fragments. No other focal abnormality is seen. IMPRESSION: ORIF of proximal left femoral fracture. Electronically Signed   By: Inez Catalina M.D.   On: 08/01/2018 09:15   Dg Hip Operative Unilat W Or W/o Pelvis Left  Result Date: 08/01/2018 CLINICAL DATA:  Intratrochanteric left femoral fracture EXAM: DG C-ARM 61-120 MIN; OPERATIVE LEFT HIP WITH PELVIS COMPARISON:  None. FLUOROSCOPY TIME:  Radiation Exposure Index (as provided by the fluoroscopic device): Not available If the device does not provide the exposure index: Fluoroscopy Time:  26 seconds Number of Acquired Images:  2 FINDINGS: Proximal fixation rod and fixation screw are noted with near anatomic alignment of the fracture fragments. No other focal abnormality is seen. IMPRESSION: ORIF of proximal left femoral fracture. Electronically Signed   By: Inez Catalina M.D.   On: 08/01/2018 09:15   Dg Hip Unilat W Or Wo Pelvis 2-3 Views Left  Result Date: 07/30/2018 CLINICAL DATA:  84 year old male status post fall at 1930 hours yesterday. Left hip pain. EXAM: DG HIP (WITH OR WITHOUT PELVIS) 2-3V LEFT COMPARISON:  Pelvis and right hip series 01/25/2018. FINDINGS: Comminuted intertrochanteric fracture of the proximal left femur with mild varus angulation. Femoral heads remain normally located. Hip joint spaces remain normal. Grossly intact proximal right femur. No acute pelvic fracture identified. Calcified aortoiliac atherosclerosis. Negative visible bowel gas pattern. IMPRESSION: Comminuted left femur intertrochanteric fracture with minimal varus  angulation. Electronically Signed   By: Genevie Ann M.D.   On: 07/30/2018 10:26     Phillips Climes M.D on 08/03/2018 at 2:52 PM  Between 7am to 7pm - Pager - [phone]  After 7pm go to [email or web address] - password [facility]  [facility] -  Office  [phone]

## 2018-08-03 NOTE — NC FL2 (Signed)
[place] MEDICAID FL2 LEVEL OF CARE SCREENING TOOL     IDENTIFICATION  Patient Name: [name] Birthdate: 09/12/1930 Sex: male Admission Date (Current Location): 07/30/2018  [facility] and [place] Number:  Herbalist and Address:  [facility] [place], [place] [address], [place], [place] [address]      Provider Number: [id number]  Attending Physician Name and Address:  Elgergawy, Silver Huguenin, MD  Relative Name and Phone Number:       Current Level of Care: Hospital Recommended Level of Care: [place] Prior Approval Number:    Date Approved/Denied:   PASRR Number: [id number]  Discharge Plan: SNF    Current Diagnoses: Patient Active Problem List   Diagnosis Date Noted  . Displaced intertrochanteric fracture of left femur, initial encounter for closed fracture ([place]) 07/30/2018  . Orthostasis 01/25/2018  . ARF (acute renal failure) ([place]) 01/25/2018  . Cervicalgia 04/26/2016  . Chronic anticoagulation 04/18/2016  . Foot pain, left 02/02/2016  . DVT (deep venous thrombosis), unspecified laterality 10/04/2015  . BMI 33.0-33.9,adult 07/25/2014  . Chest pain 07/12/2014  . Chronic back pain   . Osteopenia   . Decubitus ulcers 11/29/2013  . Tremors of nervous system 11/29/2013  . Macular degeneration of both eyes 07/13/2013  . Hyperlipidemia   . CAD (coronary artery disease)   . HTN (hypertension) 04/13/2011  . Hiatal hernia 04/13/2011  . Lumbar spondylolysis 04/13/2011  . Thyroid nodule 04/13/2011  . Sleep apnea 04/13/2011  . Diabetes mellitus, type 2 ([place]) 04/13/2011  . Prostate cancer ([place]) 04/13/2011  . Kidney disease 04/13/2011  . Diabetic retinopathy ([place]) 04/13/2011  . Diabetic neuropathy ([place]) 04/13/2011    Orientation RESPIRATION BLADDER Height & Weight     Self, Time, Place  Normal External catheter, Incontinent(placed 07/30/18) Weight: 98.9 kg (218 lb) Height:  5\' 8"  (172.7 cm)  BEHAVIORAL SYMPTOMS/MOOD  NEUROLOGICAL BOWEL NUTRITION STATUS      Incontinent Diet(Please see DC Summary)  AMBULATORY STATUS COMMUNICATION OF NEEDS Skin   Extensive Assist Verbally Normal                       Personal Care Assistance Level of Assistance  Bathing, Feeding, Dressing Bathing Assistance: Maximum assistance Feeding assistance: Limited assistance Dressing Assistance: Maximum assistance     Functional Limitations Info  Sight, Hearing, Speech Sight Info: Adequate Hearing Info: Adequate Speech Info: Adequate    SPECIAL CARE FACTORS FREQUENCY  PT (By licensed PT), OT (By licensed OT)     PT Frequency: 5x OT Frequency: 3x            Contractures Contractures Info: Not present    Additional Factors Info  Code Status, Allergies Code Status Info: DNR Allergies Info: Actos Pioglitazone, Bactrim, Doxycycline, Flagyl Metronidazole Hcl, Gabapentin, Lyrica Pregabalin, Motrin Ibuprofen, Nsaids, Ultram Tramadol Hcl, Vioxx Rofecoxib, Celebrex Celecoxib, Celebrex Celecoxib, Morphine And Related           Current Medications (08/03/2018):  This is the current hospital active medication list Current Facility-Administered Medications  Medication Dose Route Frequency Provider Last Rate Last Dose  . 0.9 %  sodium chloride infusion (Manually program via Guardrails IV Fluids)   Intravenous Once Newt Minion, MD   Stopped at 08/02/18 1450  . 0.9 %  sodium chloride infusion  1,000 mL Intravenous Continuous Newt Minion, MD 75 mL/hr at 08/01/18 1309    . 0.9 %  sodium chloride infusion   Intravenous Continuous Newt Minion,  MD   Stopped at 08/01/18 1442  . acetaminophen (TYLENOL) tablet 650 mg  650 mg Oral Q6H PRN Newt Minion, MD   650 mg at 07/30/18 2237  . amLODipine (NORVASC) tablet 2.5 mg  2.5 mg Oral Daily Newt Minion, MD   2.5 mg at 08/03/18 1046  . apixaban (ELIQUIS) tablet 5 mg  5 mg Oral BID Elgergawy, Silver Huguenin, MD   5 mg at 08/03/18 1046  . atorvastatin (LIPITOR) tablet 20 mg  20  mg Oral q1800 Newt Minion, MD   20 mg at 07/31/18 1718  . bisacodyl (DULCOLAX) suppository 10 mg  10 mg Rectal Daily PRN Newt Minion, MD      . cefTRIAXone (ROCEPHIN) 1 g in sodium chloride 0.9 % 100 mL IVPB  1 g Intravenous Daily Newt Minion, MD 200 mL/hr at 08/03/18 1103 1 g at 08/03/18 1103  . docusate sodium (COLACE) capsule 100 mg  100 mg Oral BID Newt Minion, MD   100 mg at 08/03/18 1046  . donepezil (ARICEPT) tablet 10 mg  10 mg Oral QHS Newt Minion, MD   10 mg at 08/02/18 2139  . finasteride (PROSCAR) tablet 5 mg  5 mg Oral Daily Newt Minion, MD   5 mg at 08/03/18 1046  . guaiFENesin (MUCINEX) 12 hr tablet 1,200 mg  1,200 mg Oral BID Newt Minion, MD   1,200 mg at 08/03/18 1046  . haloperidol lactate (HALDOL) injection 1 mg  1 mg Intravenous Q6H PRN Elgergawy, Silver Huguenin, MD   1 mg at 08/03/18 0342  . hydrALAZINE (APRESOLINE) injection 10 mg  10 mg Intravenous Q6H PRN Newt Minion, MD   10 mg at 07/31/18 1727  . hydrALAZINE (APRESOLINE) injection 5 mg  5 mg Intravenous Q6H PRN Elgergawy, Silver Huguenin, MD      . hydroxypropyl methylcellulose / hypromellose (ISOPTO TEARS / GONIOVISC) 2.5 % ophthalmic solution 1 drop  1 drop Both Eyes PRN Newt Minion, MD      . magnesium citrate solution 1 Bottle  1 Bottle Oral Once PRN Newt Minion, MD      . Melatonin TABS 3 mg  1 tablet Oral QHS Newt Minion, MD   3 mg at 08/02/18 2139  . methocarbamol (ROBAXIN) tablet 500 mg  500 mg Oral Q6H PRN Newt Minion, MD   500 mg at 08/03/18 0342   Or  . methocarbamol (ROBAXIN) 500 mg in dextrose 5 % 50 mL IVPB  500 mg Intravenous Q6H PRN Newt Minion, MD      . metoCLOPramide (REGLAN) tablet 5-10 mg  5-10 mg Oral Q8H PRN Newt Minion, MD       Or  . metoCLOPramide (REGLAN) injection 5-10 mg  5-10 mg Intravenous Q8H PRN Newt Minion, MD      . metoprolol tartrate (LOPRESSOR) tablet 25 mg  25 mg Oral Q8H Elgergawy, Silver Huguenin, MD   25 mg at 08/03/18 1315  . ondansetron (ZOFRAN) tablet  4 mg  4 mg Oral Q6H PRN Newt Minion, MD       Or  . ondansetron [facility]) injection 4 mg  4 mg Intravenous Q6H PRN Newt Minion, MD      . pantoprazole (PROTONIX) EC tablet 40 mg  40 mg Oral Daily Newt Minion, MD   40 mg at 08/03/18 1046  . polyethylene glycol (MIRALAX / GLYCOLAX) packet 17 g  17 g Oral  Daily Newt Minion, MD   17 g at 08/03/18 1053  . polyethylene glycol (MIRALAX / GLYCOLAX) packet 17 g  17 g Oral Daily PRN Newt Minion, MD      . QUEtiapine (SEROQUEL) tablet 25 mg  25 mg Oral QHS Elgergawy, Silver Huguenin, MD   25 mg at 08/02/18 2139  . senna (SENOKOT) tablet 8.6 mg  1 tablet Oral Daily Newt Minion, MD   8.6 mg at 08/03/18 1046     Discharge Medications: Please see discharge summary for a list of discharge medications.  Relevant Imaging Results:  Relevant Lab Results:   Additional Information SSN: [id number]  Benard Halsted, LCSWA

## 2018-08-03 NOTE — Care Management Note (Signed)
Case Management Note  Patient Details  Name: [name] MRN: [id number] Date of Birth: 01-30-1930  Subjective/Objective:    Pt  from [place]. Admitted with Displaced intertrochanteric fracture of left femur.       8/3 - s/p  INTRAMEDULLARY (IM) NAIL INTERTROCHANTRIC   Kandace Blitz (Daughter) Flossie Dibble (Daughter)    [phone]      PCP: [name]  Action/Plan: PT evauation pending... patient from SNF.  NCM following for disposition needs.  Expected Discharge Date:                  Expected Discharge Plan:  Skilled Nursing Facility  In-House Referral:  Clinical Social Work  Discharge planning Services  CM Consult  Post Acute Care Choice:    Choice offered to:     DME Arranged:    DME Agency:     HH Arranged:    [place] Agency:     Status of Service:  In process, will continue to follow  If discussed at Long Length of Stay Meetings, dates discussed:    Additional Comments:  Sharin Mons, RN 08/03/2018, 7:55 AM

## 2018-08-03 NOTE — Discharge Instructions (Signed)

## 2018-08-03 NOTE — Evaluation (Signed)
Physical Therapy Evaluation Patient Details Name: [name] MRN: [id number] DOB: 01-31-1930 Today's Date: 08/03/2018   History of Present Illness  Patient is an 84 y/o male s/p fall with resultant Left comminuted femur intertrochanteric fracture. CT head without contrast and CT cervical spine unremarkable for any acute findings. PMH significant for dementia, coronary artery disease, RLE DVT on Coumadin, OSA, type 2 diabetes, diabetic polyneuropathy, hypertension, hyperlipidemia. INTRAMEDULLARY (IM) NAIL INTERTROCHANTRIC on 08/01/18.     Clinical Impression  [name] is an 84 y/o male admitted with the above listed diagnosis. Patient is a prior resident of SNF, however, unsure of true PLOF as patient is a poor historian and unable to provide this information. Patient today requiring Max A +2 for bed mobility and sit to stand transfers with inability to maintain static stance at bedside without physical assist. PT to recommend SNF at discharge to continue to progress safe functional mobility. PT to continue to follow to progress as tolerated.     Follow Up Recommendations SNF;Supervision/Assistance - 24 hour    Equipment Recommendations  Other (comment)(TBD)    Recommendations for Other Services       Precautions / Restrictions Precautions Precautions: Fall Restrictions Weight Bearing Restrictions: Yes LLE Weight Bearing: Weight bearing as tolerated      Mobility  Bed Mobility Overal bed mobility: Needs Assistance Bed Mobility: Rolling;Sidelying to Sit Rolling: Max assist Sidelying to sit: Max assist;+2 for physical assistance       General bed mobility comments: grimaces in pain with sidelying to sit  Transfers Overall transfer level: Needs assistance Equipment used: 2 person hand held assist Transfers: Sit to/from Stand Sit to Stand: Mod assist;Max assist;+2 physical assistance         General transfer comment: A to power up at bedside; does bear weight through  B LE but without full knee extension; requires physical assist to maintain standing; posterior lean  Ambulation/Gait                Stairs            Wheelchair Mobility    Modified Rankin (Stroke Patients Only)       Balance Overall balance assessment: Needs assistance Sitting-balance support: Bilateral upper extremity supported;Feet supported Sitting balance-Leahy Scale: Poor   Postural control: Posterior lean;Left lateral lean                                   Pertinent Vitals/Pain Pain Assessment: Faces Faces Pain Scale: Hurts even more Pain Location: L hip Pain Descriptors / Indicators: Grimacing;Moaning;Operative site guarding Pain Intervention(s): Limited activity within patient's tolerance;Monitored during session;Repositioned    Home Living Family/patient expects to be discharged to:: Skilled nursing facility                      Prior Function           Comments: unsure; patient states using a w/c for 6 years, but is a poor historian with no family present to confirm/deny history     Hand Dominance        Extremity/Trunk Assessment        Lower Extremity Assessment Lower Extremity Assessment: Generalized weakness    Cervical / Trunk Assessment Cervical / Trunk Assessment: Kyphotic  Communication      Cognition Arousal/Alertness: Awake/alert Behavior During Therapy: Restless Overall Cognitive Status: No family/caregiver present to determine baseline cognitive functioning  General Comments      Exercises     Assessment/Plan    PT Assessment Patient needs continued PT services  PT Problem List Decreased strength;Decreased range of motion;Decreased activity tolerance;Decreased balance;Decreased mobility;Decreased coordination;Decreased knowledge of use of DME;Decreased safety awareness       PT Treatment Interventions DME instruction;Gait  training;Functional mobility training;Therapeutic activities;Therapeutic exercise;Balance training;Neuromuscular re-education;Patient/family education    PT Goals (Current goals can be found in the Care Plan section)  Acute Rehab PT Goals Patient Stated Goal: none stated PT Goal Formulation: With patient Time For Goal Achievement: 08/17/18 Potential to Achieve Goals: Fair    Frequency Min 2X/week   Barriers to discharge        Co-evaluation               AM-PAC PT "6 Clicks" Daily Activity  Outcome Measure Difficulty turning over in bed (including adjusting bedclothes, sheets and blankets)?: Unable Difficulty moving from lying on back to sitting on the side of the bed? : Unable Difficulty sitting down on and standing up from a chair with arms (e.g., wheelchair, bedside commode, etc,.)?: Unable Help needed moving to and from a bed to chair (including a wheelchair)?: Total Help needed walking in hospital room?: Total Help needed climbing 3-5 steps with a railing? : Total 6 Click Score: 6    End of Session Equipment Utilized During Treatment: Gait belt Activity Tolerance: Patient tolerated treatment well Patient left: in bed;with call bell/phone within reach;with bed alarm set;with nursing/sitter in room Nurse Communication: Mobility status PT Visit Diagnosis: Other abnormalities of gait and mobility (R26.89);Unsteadiness on feet (R26.81);Muscle weakness (generalized) (M62.81);History of falling (Z91.81)    Time: 1443-1540 PT Time Calculation (min) (ACUTE ONLY): 28 min   Charges:   PT Evaluation $PT Eval Moderate Complexity: 1 Mod PT Treatments $Therapeutic Activity: 8-22 mins       Lanney Gins, PT, DPT 08/03/18 3:26 PM Pager: [phone]

## 2018-08-04 LAB — CBC
HCT: 30.2 % — ABNORMAL LOW (ref 39.0–52.0)
HEMOGLOBIN: 9.6 g/dL — AB (ref 13.0–17.0)
MCH: 30.3 pg (ref 26.0–34.0)
MCHC: 31.8 g/dL (ref 30.0–36.0)
MCV: 95.3 fL (ref 78.0–100.0)
PLATELETS: 165 10*3/uL (ref 150–400)
RBC: 3.17 MIL/uL — ABNORMAL LOW (ref 4.22–5.81)
RDW: 14.3 % (ref 11.5–15.5)
WBC: 7.9 10*3/uL (ref 4.0–10.5)

## 2018-08-04 LAB — BASIC METABOLIC PANEL
Anion gap: 10 (ref 5–15)
BUN: 20 mg/dL (ref 8–23)
CHLORIDE: 107 mmol/L (ref 98–111)
CO2: 27 mmol/L (ref 22–32)
CREATININE: 1.11 mg/dL (ref 0.61–1.24)
Calcium: 8.4 mg/dL — ABNORMAL LOW (ref 8.9–10.3)
GFR, EST NON AFRICAN AMERICAN: 59 mL/min — AB (ref >60)
Glucose, Bld: 130 mg/dL — ABNORMAL HIGH (ref 70–99)
POTASSIUM: 3.5 mmol/L (ref 3.5–5.1)
SODIUM: 144 mmol/L (ref 135–145)

## 2018-08-04 LAB — GLUCOSE, CAPILLARY
Glucose-Capillary: 122 mg/dL — ABNORMAL HIGH (ref 70–99)
Glucose-Capillary: 126 mg/dL — ABNORMAL HIGH (ref 70–99)

## 2018-08-04 MED ORDER — INSULIN ASPART 100 UNIT/ML ~~LOC~~ SOLN: 0.0000 [IU] | Freq: Three times a day (TID) | SUBCUTANEOUS | 11 refills | Status: DC

## 2018-08-04 MED ORDER — INSULIN ASPART 100 UNIT/ML ~~LOC~~ SOLN
0.0000 [IU] | Freq: Three times a day (TID) | SUBCUTANEOUS | Status: DC
  Administered 2018-08-04 (×2): 1 [IU] via SUBCUTANEOUS

## 2018-08-04 MED ORDER — APIXABAN 5 MG PO TABS: 5.0000 mg | ORAL_TABLET | Freq: Two times a day (BID) | ORAL | Status: AC

## 2018-08-04 MED ORDER — FENTANYL 50 MCG/HR TD PT72: 50.0000 ug | MEDICATED_PATCH | TRANSDERMAL | 0 refills | Status: AC

## 2018-08-04 MED ORDER — METOPROLOL TARTRATE 25 MG PO TABS: 37.5000 mg | ORAL_TABLET | Freq: Two times a day (BID) | ORAL | Status: AC

## 2018-08-04 MED ORDER — OXYCODONE HCL 5 MG PO CAPS: 5.0000 mg | ORAL_CAPSULE | Freq: Three times a day (TID) | ORAL | 0 refills | Status: AC | PRN

## 2018-08-04 NOTE — Progress Notes (Signed)
Report called to receiving RN at [facility]

## 2018-08-04 NOTE — Progress Notes (Signed)
Patient is confused and has mittens on at this time. RT did not place patient on CPAP HS. Patient in no distress.

## 2018-08-04 NOTE — Discharge Summary (Addendum)
[name], is a 84 y.o. male  DOB 1930/01/23  MRN [id number].  Admission date:  07/30/2018  Admitting Physician  [name], DO  Discharge Date:  08/04/2018   Primary MD  [name], FNP  Recommendations for primary care physician for things to follow:  -Please check CBC, BMP in 3 days -Patient need to follow with Ortho as an outpatient within 2 weeks, please see follow-up recommendation below  CODE STATUS : DNR   Admission Diagnosis  Fall [W19.XXXA] Atrial fibrillation with rapid ventricular response ([place]) [I48.91] Displaced intertrochanteric fracture of left femur, initial encounter for closed fracture [facility]) [S72.142A]   Discharge Diagnosis  Fall [W19.XXXA] Atrial fibrillation with rapid ventricular response ([place]) [I48.91] Displaced intertrochanteric fracture of left femur, initial encounter for closed fracture ([place]) [S72.142A]    Principal Problem:   Displaced intertrochanteric fracture of left femur, initial encounter for closed fracture [facility])      Past Medical History:  Diagnosis Date  . CAD (coronary artery disease)    (Left main normal, LAD 30-40% mid stenosis, diagonal 30-40% stenosis, circumflex 30-40% stenosis, right coronary artery 30-40% stenosis. 2003.)  . CHF (congestive heart failure) ([place])   . Chronic back pain   . Chronic headache   . Chronic kidney disease   . Chronic pain    right hip/groin, neck, back  . Dementia   . Diabetes mellitus   . Diabetic neuropathy ([place])   . Diabetic retinopathy    right eye   . DVT of leg (deep venous thrombosis) ([place]) 1999   RLE   . GERD (gastroesophageal reflux disease)   . Goiter    stable x many year - last endo exam 08/2010  . Hiatal hernia   . HTN (hypertension)   . Hyperlipidemia   . Hypertension   . Lumbar spondylosis   . Macular degeneration   . Osteopenia   . Peripheral vertigo   . Prostate cancer ([place])    . Sleep apnea    CPAP  . Thyroid nodule   . Type 2 diabetes mellitus ([place])     Past Surgical History:  Procedure Laterality Date  . APPENDECTOMY    . biopsy of right ear    . CATARACT EXTRACTION, BILATERAL    . CHOLECYSTECTOMY    . INGUINAL HERNIA REPAIR     bilateral  . INGUINAL HERNIA REPAIR     Bilateral  . INTRAMEDULLARY (IM) NAIL INTERTROCHANTERIC Left 08/01/2018   Procedure: INTRAMEDULLARY (IM) NAIL INTERTROCHANTRIC;  Surgeon: Newt Minion, MD;  Location: [place];  Service: Orthopedics;  Laterality: Left;  . KNEE ARTHROSCOPY     left   . TONSILECTOMY, ADENOIDECTOMY, BILATERAL MYRINGOTOMY AND TUBES    . TONSILLECTOMY AND ADENOIDECTOMY         History of present illness and  Hospital Course:     Kindly see H&P for history of present illness and admission details, please review complete Labs, Consult reports and Test reports for all details in brief  HPI  from the history  and physical done on the day of admission 07/30/2018  HPI: [name] is a 84 y.o. male with medical history significant for dementia, coronary artery disease, RLE DVT on Coumadin, OSA, type 2 diabetes, diabetic polyneuropathy, hypertension, hyperlipidemia, who presented to [facility] ED post fall that occurred yesterday evening at [facility].  History is obtained from the patient's daughter at bedside.  Unable to obtain reliable history from the patient due to altered mental status.  Per the daughter, the patient had a fall after turning on the wrong side of the bed in his room at SNF, hitting his left hip and his head.  Was in his usual state of health prior to that.  Has had intermittent falls in the past 2 months.  ED Course: Upon presentation to the ED, patient is alert but confused in the setting of dementia. Per daughter, baseline mental status is intermittent confusion but not agitation.  CT head without contrast and CT cervical spine unremarkable for any acute findings.  Urinalysis with mild pyuria.   Chest x-ray unremarkable for any lobular infiltrates.  Vital signs significant for sinus tachycardia and intermittent A. fib RVR on monitor in the room.   Hospital Course   84 y.o.malewith medical history significant for dementia, coronary artery disease, RLE DVT on Coumadin, OSA, type 2 diabetes, diabetic polyneuropathy, hypertension, hyperlipidemia, who presented to [facility] ED post fall  at [facility], work-up significant for left femur fracture, as well was noted to have paroxysmal A. fib which is a new diagnosis, patient went for surgical repair 08/01/2018, postoperative course complicated by hospital delirium.   Left comminuted femur intertrochanteric fracture -Due to mechanical fall, orthopedic consult greatly appreciated, overall high risk for surgery, but no further work-up needed before surgery, patient went for surgical repair 08/01/2018 . -To follow with orthopedic Dr. Sharol Given within 2 weeks. -Patient with known chronic lower back pain, he was resumed on his home medication regimen which is fentanyl patch and PRN oxycodone, will serve to control his pain status post hip surgery.  Acute metabolic encephalopathy most likely secondary to acute illness -Apparently patient was agitated and confused on presentation, and significantly improved since admission, but hospital course was complicated by intermittent hospital delirium has improved with Seroquel nightly, he did require PRN little as well, currently improved.   Paroxysmal A. fib with RVR -Patient was noted to have intermittent episodes of paroxysmal A. fib during these episodes heart rate goes are controlled in the 130s and 140s, otherwise he is in normal sinus rhythm, continue with telemetry monitoring, 2D echo with no significant valvular disease, or left atrium enlargement or regional wall motion abnormality. -Cardiology consult appreciated -on Warfarin for history of DVT, he did receive vitamin K to reverse it, started on  Eliquis -Sent with recurrent episodes of A. fib with RVR during hospital stay, so he was started on metoprolol, overall heart rate has been controlled  Hypokalemia -Repleted,  Hypertension -continue with amlodipine,  Hyperlipidemia -Continue Lipitor  Chronic depression/anxiety - Continue Lexapro   BPH - On finasteride  Type 2 diabetes -Hold home dose metformin, started on insulin sliding scale  History of RLE DVT -On warfarin prior to admission, has been changed to Eliquis  Dementia - Continue donepezil  UTI -Treated with Rocephin.     Discharge Condition:  Stable at time of discharge Discussed with daughter, confirm CODE STATUS of DNR   Follow UP  Follow-up Information    Newt Minion, MD In 2 weeks.   Specialty:  Orthopedic Surgery Contact information: [place] [address] [phone]             Discharge Instructions  and  Discharge Medications    Discharge Instructions    Change dressing   Complete by:  As directed    PRN dressing change   Discharge instructions   Complete by:  As directed    Follow with Primary MD [name], FNP or SNF physician in 3 days  Get CBC, CMP, checked  by Primary MD next visit.    Activity: LLE Weight Bearing: Weight bearing as tolerated  with Full fall precautions use walker/cane & assistance as needed   Disposition snf   Diet: Heart Healthy, right modified, with feeding assistance and aspiration precautions.  For Heart failure patients - Check your Weight same time everyday, if you gain over 2 pounds, or you develop in leg swelling, experience more shortness of breath or chest pain, call your Primary MD immediately. Follow Cardiac Low Salt Diet and 1.5 lit/day fluid restriction.   On your next visit with your primary care physician please Get Medicines reviewed and adjusted.   Please request your Prim.MD to go over all Hospital Tests and  Procedure/Radiological results at the follow up, please get all Hospital records sent to your Prim MD by signing hospital release before you go home.   If you experience worsening of your admission symptoms, develop shortness of breath, life threatening emergency, suicidal or homicidal thoughts you must seek medical attention immediately by calling 911 or calling your MD immediately  if symptoms less severe.  You Must read complete instructions/literature along with all the possible adverse reactions/side effects for all the Medicines you take and that have been prescribed to you. Take any new Medicines after you have completely understood and accpet all the possible adverse reactions/side effects.   Do not drive, operating heavy machinery, perform activities at heights, swimming or participation in water activities or provide baby sitting services if your were admitted for syncope or siezures until you have seen by Primary MD or a Neurologist and advised to do so again.  Do not drive when taking Pain medications.    Do not take more than prescribed Pain, Sleep and Anxiety Medications  Special Instructions: If you have smoked or chewed Tobacco  in the last 2 yrs please stop smoking, stop any regular Alcohol  and or any Recreational drug use.  Wear Seat belts while driving.   Please note  You were cared for by a hospitalist during your hospital stay. If you have any questions about your discharge medications or the care you received while you were in the hospital after you are discharged, you can call the unit and asked to speak with the hospitalist on call if the hospitalist that took care of you is not available. Once you are discharged, your primary care physician will handle any further medical issues. Please note that NO REFILLS for any discharge medications will be authorized once you are discharged, as it is imperative that you return to your primary care physician (or establish a  relationship with a primary care physician if you do not have one) for your aftercare needs so that they can reassess your need for medications and monitor your lab values.   Increase activity slowly   Complete by:  As directed    LLE Weight Bearing: Weight bearing as tolerated   Weight bearing as tolerated   Complete by:  As directed  Laterality:  bilateral   Extremity:  Lower     Allergies as of 08/04/2018      Reactions   Actos [pioglitazone]    Allergic reaction   Bactrim Other (See Comments)   Unknown    Doxycycline    Caused black spots in his eyes   Flagyl [metronidazole Hcl]    unknown   Gabapentin    Lyrica [pregabalin]    Increase appetite    Motrin [ibuprofen]    Nsaids    Swelling    Ultram [tramadol Hcl]    Itching / rash    Vioxx [rofecoxib]    Swelling    Celebrex [celecoxib] Rash   Celebrex [celecoxib] Rash   Morphine And Related Rash      Medication List    STOP taking these medications   acetaminophen 650 MG CR tablet Commonly known as:  TYLENOL   ASPIRIN CHILDRENS 81 MG chewable tablet Generic drug:  aspirin   metFORMIN 500 MG 24 hr tablet Commonly known as:  GLUCOPHAGE-XR   metFORMIN 750 MG 24 hr tablet Commonly known as:  GLUCOPHAGE-XR   warfarin 6 MG tablet Commonly known as:  COUMADIN     TAKE these medications   ACCU-CHEK AVIVA PLUS test strip Generic drug:  glucose blood CHECK BLOOD SUGAR 2 TO 3 TIMES A DAY AND AS NEEDED   amLODipine 2.5 MG tablet Commonly known as:  NORVASC Take 2.5 mg by mouth daily.   apixaban 5 MG Tabs tablet Commonly known as:  ELIQUIS Take 1 tablet (5 mg total) by mouth 2 (two) times daily.   atorvastatin 20 MG tablet Commonly known as:  LIPITOR Take 20 mg by mouth daily at 6 PM.   BIOFREEZE EX Apply 1 application topically daily as needed (back, side, and shoulder pain).   donepezil 10 MG tablet Commonly known as:  ARICEPT Take 10 mg by mouth at bedtime.   escitalopram 10 MG tablet Commonly  known as:  LEXAPRO Take 10 mg by mouth daily.   fentaNYL 50 MCG/HR Commonly known as:  DURAGESIC - dosed mcg/hr Place 1 patch (50 mcg total) onto the skin every 3 (three) days.   finasteride 5 MG tablet Commonly known as:  PROSCAR Take 1 tablet (5 mg total) by mouth daily.   HUGO ROLLING [place] 1 each by Does not apply route daily.   insulin aspart 100 UNIT/ML injection Commonly known as:  novoLOG Inject 0-9 Units into the skin 3 (three) times daily with meals.   loratadine 10 MG tablet Commonly known as:  CLARITIN Take 10 mg by mouth daily as needed for allergies.   magnesium hydroxide 400 MG/5ML suspension Commonly known as:  MILK OF MAGNESIA Take 30 mLs by mouth daily as needed for mild constipation.   Melatonin 3 MG Tabs Take 1 tablet by mouth at bedtime.   metoprolol tartrate 25 MG tablet Commonly known as:  LOPRESSOR Take 1.5 tablets (37.5 mg total) by mouth 2 (two) times daily.   omeprazole 20 MG capsule Commonly known as:  PRILOSEC Take 1 capsule (20 mg total) by mouth daily.   OPTIVE 0.5-0.9 % ophthalmic solution Generic drug:  carboxymethylcellul-glycerin Place 1 drop into both eyes as needed for dry eyes.   oxycodone 5 MG capsule Commonly known as:  OXY-IR Take 1 capsule (5 mg total) by mouth every 8 (eight) hours as needed for pain.   polyethylene glycol packet Commonly known as:  MIRALAX / GLYCOLAX Take 17 g by mouth 2 (  two) times daily.   STOOL SOFTENER 100 MG capsule Generic drug:  docusate sodium Take 100 mg by mouth 2 (two) times daily.   SYSTANE 0.4-0.3 % Soln Generic drug:  Polyethyl Glycol-Propyl Glycol Place 1 drop into the right eye 4 (four) times daily as needed.   tiZANidine 4 MG tablet Commonly known as:  ZANAFLEX Take 4 mg by mouth 3 (three) times daily.            Discharge Care Instructions  (From admission, onward)        Start     Ordered   08/01/18 0000  Weight bearing as tolerated    Question Answer  Comment  Laterality bilateral   Extremity Lower      08/01/18 0916   08/01/18 0000  Change dressing    Comments:  PRN dressing change   08/01/18 0916        Diet and Activity recommendation: See Discharge Instructions above   Consults obtained -  Cardiology Orthopedics   Major procedures and Radiology Reports - PLEASE review detailed and final reports for all details, in brief -   INTRAMEDULLARY (IM) NAIL INTERTROCHANTRIC of left hip by Dr. Sharol Given 08/01/2018      Dg Chest 1 View  Result Date: 07/30/2018 CLINICAL DATA:  84 year old male status post fall with proximal left femur fracture. EXAM: CHEST  1 VIEW COMPARISON:  01/25/2018 and earlier. FINDINGS: AP supine view at 1008 hours. Stable cardiomegaly and mediastinal contours. Visualized tracheal air column is within normal limits. Stable lung volumes. No pneumothorax, pulmonary edema, pleural effusion or confluent pulmonary opacity. IMPRESSION: Chronic cardiomegaly.  No acute cardiopulmonary abnormality. Electronically Signed   By: Genevie Ann M.D.   On: 07/30/2018 10:27   Ct Head Wo Contrast  Result Date: 07/30/2018 CLINICAL DATA:  Fall last night broken hip. EXAM: CT HEAD WITHOUT CONTRAST CT CERVICAL SPINE WITHOUT CONTRAST TECHNIQUE: Multidetector CT imaging of the head and cervical spine was performed following the standard protocol without intravenous contrast. Multiplanar CT image reconstructions of the cervical spine were also generated. COMPARISON:  CT head 01/25/2018 FINDINGS: CT HEAD FINDINGS Brain: Image quality degraded by motion. Multiple images repeated due to motion Moderate to advanced atrophy. Moderate chronic white matter changes. No acute infarct, hemorrhage, or mass. Negative for subdural hematoma. Vascular: Negative for hyperdense vessel Skull: Negative for fracture Sinuses/Orbits: Paranasal sinuses clear. Bilateral cataract surgery. Other: None CT CERVICAL SPINE FINDINGS Alignment: 2 mm anterolisthesis C3-4 unchanged  from prior neck CT of 08/19/2016. Extensive facet degeneration at this level. Mild anterolisthesis at C6-7 also unchanged. Skull base and vertebrae: Negative for acute fracture. Negative for mass lesion. Soft tissues and spinal canal: Cystic nodule right lobe of the thyroid measuring approximately 23 x 38 mm on axial images. Coarse calcification along the superior margin of the nodule. No adenopathy in the neck. Disc levels: Marked facet degeneration at C3-4. Diffuse uncinate spurring with moderate foraminal encroachment bilaterally and mild spinal stenosis. Moderate foraminal narrowing due to spurring and C5-6 and C6-7. Mild foraminal narrowing bilaterally at C4-5. Upper chest: Negative Other: None IMPRESSION: Image quality degraded by motion Atrophy and chronic microvascular ischemia. No acute intracranial abnormality 1. Extensive cervical spine degenerative change without fracture. 2. Right thyroid cystic nodule 23 x 28 mm. Electronically Signed   By: Franchot Gallo M.D.   On: 07/30/2018 12:58   Ct Cervical Spine Wo Contrast  Result Date: 07/30/2018 CLINICAL DATA:  Fall last night broken hip. EXAM: CT HEAD WITHOUT CONTRAST CT CERVICAL  SPINE WITHOUT CONTRAST TECHNIQUE: Multidetector CT imaging of the head and cervical spine was performed following the standard protocol without intravenous contrast. Multiplanar CT image reconstructions of the cervical spine were also generated. COMPARISON:  CT head 01/25/2018 FINDINGS: CT HEAD FINDINGS Brain: Image quality degraded by motion. Multiple images repeated due to motion Moderate to advanced atrophy. Moderate chronic white matter changes. No acute infarct, hemorrhage, or mass. Negative for subdural hematoma. Vascular: Negative for hyperdense vessel Skull: Negative for fracture Sinuses/Orbits: Paranasal sinuses clear. Bilateral cataract surgery. Other: None CT CERVICAL SPINE FINDINGS Alignment: 2 mm anterolisthesis C3-4 unchanged from prior neck CT of 08/19/2016.  Extensive facet degeneration at this level. Mild anterolisthesis at C6-7 also unchanged. Skull base and vertebrae: Negative for acute fracture. Negative for mass lesion. Soft tissues and spinal canal: Cystic nodule right lobe of the thyroid measuring approximately 23 x 38 mm on axial images. Coarse calcification along the superior margin of the nodule. No adenopathy in the neck. Disc levels: Marked facet degeneration at C3-4. Diffuse uncinate spurring with moderate foraminal encroachment bilaterally and mild spinal stenosis. Moderate foraminal narrowing due to spurring and C5-6 and C6-7. Mild foraminal narrowing bilaterally at C4-5. Upper chest: Negative Other: None IMPRESSION: Image quality degraded by motion Atrophy and chronic microvascular ischemia. No acute intracranial abnormality 1. Extensive cervical spine degenerative change without fracture. 2. Right thyroid cystic nodule 23 x 28 mm. Electronically Signed   By: Franchot Gallo M.D.   On: 07/30/2018 12:58   Dg C-arm 1-60 Min  Result Date: 08/01/2018 CLINICAL DATA:  Intratrochanteric left femoral fracture EXAM: DG C-ARM 61-120 MIN; OPERATIVE LEFT HIP WITH PELVIS COMPARISON:  None. FLUOROSCOPY TIME:  Radiation Exposure Index (as provided by the fluoroscopic device): Not available If the device does not provide the exposure index: Fluoroscopy Time:  26 seconds Number of Acquired Images:  2 FINDINGS: Proximal fixation rod and fixation screw are noted with near anatomic alignment of the fracture fragments. No other focal abnormality is seen. IMPRESSION: ORIF of proximal left femoral fracture. Electronically Signed   By: Inez Catalina M.D.   On: 08/01/2018 09:15   Dg Hip Operative Unilat W Or W/o Pelvis Left  Result Date: 08/01/2018 CLINICAL DATA:  Intratrochanteric left femoral fracture EXAM: DG C-ARM 61-120 MIN; OPERATIVE LEFT HIP WITH PELVIS COMPARISON:  None. FLUOROSCOPY TIME:  Radiation Exposure Index (as provided by the fluoroscopic device): Not  available If the device does not provide the exposure index: Fluoroscopy Time:  26 seconds Number of Acquired Images:  2 FINDINGS: Proximal fixation rod and fixation screw are noted with near anatomic alignment of the fracture fragments. No other focal abnormality is seen. IMPRESSION: ORIF of proximal left femoral fracture. Electronically Signed   By: Inez Catalina M.D.   On: 08/01/2018 09:15   Dg Hip Unilat W Or Wo Pelvis 2-3 Views Left  Result Date: 07/30/2018 CLINICAL DATA:  83 year old male status post fall at 1930 hours yesterday. Left hip pain. EXAM: DG HIP (WITH OR WITHOUT PELVIS) 2-3V LEFT COMPARISON:  Pelvis and right hip series 01/25/2018. FINDINGS: Comminuted intertrochanteric fracture of the proximal left femur with mild varus angulation. Femoral heads remain normally located. Hip joint spaces remain normal. Grossly intact proximal right femur. No acute pelvic fracture identified. Calcified aortoiliac atherosclerosis. Negative visible bowel gas pattern. IMPRESSION: Comminuted left femur intertrochanteric fracture with minimal varus angulation. Electronically Signed   By: Genevie Ann M.D.   On: 07/30/2018 10:26    Micro Results    Recent Results (from the  past 240 hour(s))  Surgical pcr screen     Status: None   Collection Time: 07/30/18 11:26 PM  Result Value Ref Range Status   MRSA, PCR NEGATIVE NEGATIVE Final   Staphylococcus aureus NEGATIVE NEGATIVE Final    Comment: (NOTE) The Xpert SA Assay (FDA approved for NASAL specimens in patients 77 years of age and older), is one component of a comprehensive surveillance program. It is not intended to diagnose infection nor to guide or monitor treatment. Performed at [facility], [place] [address] [place], [place] [address]   MRSA PCR Screening     Status: None   Collection Time: 08/01/18 12:26 PM  Result Value Ref Range Status   MRSA by PCR NEGATIVE NEGATIVE Final    Comment:        The GeneXpert MRSA Assay (FDA approved for  NASAL specimens only), is one component of a comprehensive MRSA colonization surveillance program. It is not intended to diagnose MRSA infection nor to guide or monitor treatment for MRSA infections. Performed at [facility], [place] [address] [place], [place] [address]        Today   Subjective:   [name] today has no headache,no chest OR abdominal pain, has participated with PT yesterday, has some significant weakness, as well poor appetite  Objective:   Blood pressure (!) 164/88, pulse 67, temperature 98.3 F (36.8 C), temperature source Oral, resp. rate (!) 25, height 5\' 8"  (1.727 m), weight 98.9 kg (218 lb), SpO2 90 %.   Intake/Output Summary (Last 24 hours) at 08/04/2018 1147 Last data filed at 08/04/2018 0228 Gross per 24 hour  Intake 200 ml  Output 300 ml  Net -100 ml    Exam Awake, confused Symmetrical Chest wall movement, Good air movement bilaterally, CTAB RRR,No Gallops,Rubs or new Murmurs, No Parasternal Heave +ve B.Sounds, Abd Soft, Non tender, No rebound -guarding or rigidity. No Cyanosis, Clubbing or edema, No new Rash or bruise  Data Review   CBC w Diff:  Lab Results  Component Value Date   WBC 7.9 08/04/2018   HGB 9.6 (L) 08/04/2018   HGB 12.8 06/06/2016   HCT 30.2 (L) 08/04/2018   HCT 39.0 06/06/2016   PLT 165 08/04/2018   PLT 180 06/06/2016   LYMPHOPCT 33 07/30/2018   MONOPCT 5 07/30/2018   EOSPCT 0 07/30/2018   BASOPCT 0 07/30/2018    CMP:  Lab Results  Component Value Date   NA 144 08/04/2018   NA 142 06/06/2016   K 3.5 08/04/2018   CL 107 08/04/2018   CO2 27 08/04/2018   BUN 20 08/04/2018   BUN 16 06/06/2016   CREATININE 1.11 08/04/2018   CREATININE 1.66 (H) 07/13/2013   PROT 6.7 07/30/2018   PROT 6.8 06/06/2016   ALBUMIN 3.8 07/30/2018   ALBUMIN 4.2 06/06/2016   BILITOT 1.3 (H) 07/30/2018   BILITOT 0.4 06/06/2016   ALKPHOS 70 07/30/2018   AST 25 07/30/2018   ALT 16 07/30/2018  .   Total Time in  preparing paper work, data evaluation and todays exam - 78 minutes  Phillips Climes M.D on 08/04/2018 at 11:47 AM  [facility]   Office  [phone]

## 2018-08-04 NOTE — Progress Notes (Signed)
Patient keeps taking tele leads off un able to keep track of vital signs still seams confuse and agitated will continue to keep eyes on him

## 2018-08-04 NOTE — Progress Notes (Signed)
Patient has been pick up by [facility]. Vital signs was  within normal range. Patient has no any belongings.

## 2018-08-04 NOTE — Care Management Important Message (Signed)
Important Message  Patient Details  Name: [name] MRN: [id number] Date of Birth: 05-20-1930   Medicare Important Message Given:  Yes    Israel Werts 08/04/2018, 9:01 AM

## 2018-08-04 NOTE — Progress Notes (Signed)
Patient will DC to: [facility] Anticipated DC date: 08/04/18 Family notified: Daughter, Insurance risk surveyor by: Corey Harold next available   Per MD patient ready for DC to [facility]. RN, patient, patient's family, and facility notified of DC. Discharge Summary sent to facility. RN given number for report [phone]). DC packet on chart. Ambulance transport requested for patient.   CSW signing off.  Cedric Fishman, LCSW Clinical Social Worker [phone]

## 2018-08-05 ENCOUNTER — Encounter (INDEPENDENT_AMBULATORY_CARE_PROVIDER_SITE_OTHER): Payer: Self-pay | Admitting: Family

## 2018-08-05 ENCOUNTER — Telehealth (INDEPENDENT_AMBULATORY_CARE_PROVIDER_SITE_OTHER): Payer: Self-pay | Admitting: Orthopedic Surgery

## 2018-08-05 ENCOUNTER — Telehealth (INDEPENDENT_AMBULATORY_CARE_PROVIDER_SITE_OTHER): Payer: Self-pay

## 2018-08-05 ENCOUNTER — Ambulatory Visit (INDEPENDENT_AMBULATORY_CARE_PROVIDER_SITE_OTHER): Payer: Medicare Other | Admitting: Family

## 2018-08-05 DIAGNOSIS — S72142A Displaced intertrochanteric fracture of left femur, initial encounter for closed fracture: Secondary | ICD-10-CM

## 2018-08-05 NOTE — Telephone Encounter (Signed)
Caryl Pina, NP with [facility] at [facility] called stating that patient has a large Hematoma on his incision, left hip.  Stated that patient's dressing had to changed due to dressing being saturated.  Cb# is [phone].  Please advise.  Thank you.

## 2018-08-05 NOTE — Telephone Encounter (Signed)
Patient's facility called stating that they need an H&H and wanted to give the results.  They were 9.5 and 28.4.  Thank you.

## 2018-08-05 NOTE — Telephone Encounter (Signed)
Called and sw ashley advised appt today with erin at 2pm

## 2018-08-05 NOTE — Telephone Encounter (Signed)
Per erin noted.

## 2018-08-05 NOTE — Progress Notes (Signed)
Post-Op Visit Note   Patient: [name]           Date of Birth: 02/01/1930           MRN: [id number] Visit Date: 08/05/2018 PCP: [name], FNP  Chief Complaint:  Chief Complaint  Patient presents with  . Left Hip - Wound Check    08/01/18   IM Nail Intertrochanteric Left Hip    HPI:  HPI Patient is an 84 year old gentleman presents today status post IM nailing of the left femur on August 3.  Skilled nursing facility brings patient concern for dressing changes.  They have had excessive bleeding and had to change his dressings multiple times since his discharge yesterday.  The patient is on Eliquis.  Ortho Exam Lateral thigh incisions are well approximated with staples there is no visible drainage today.  Dressings are intact these are not saturated.  No palpable hematoma or seroma.  There is widespread ecchymosis.  Visit Diagnoses:  1. Displaced intertrochanteric fracture of left femur, initial encounter for closed fracture ([place])     Plan: Continue with daily dry dressing changes.  Change dressings PRN soiled.  May call for any concerns.  Follow-up in office in 2 weeks with radiographs of the femur.  Follow-Up Instructions: Return in about 2 weeks (around 08/19/2018).   Imaging: No results found.  Orders:  No orders of the defined types were placed in this encounter.  No orders of the defined types were placed in this encounter.    PMFS History: Patient Active Problem List   Diagnosis Date Noted  . Displaced intertrochanteric fracture of left femur, initial encounter for closed fracture ([place]) 07/30/2018  . Orthostasis 01/25/2018  . ARF (acute renal failure) ([place]) 01/25/2018  . Cervicalgia 04/26/2016  . Chronic anticoagulation 04/18/2016  . Foot pain, left 02/02/2016  . DVT (deep venous thrombosis), unspecified laterality 10/04/2015  . BMI 33.0-33.9,adult 07/25/2014  . Chest pain 07/12/2014  . Chronic back pain   . Osteopenia   . Decubitus  ulcers 11/29/2013  . Tremors of nervous system 11/29/2013  . Macular degeneration of both eyes 07/13/2013  . Hyperlipidemia   . CAD (coronary artery disease)   . HTN (hypertension) 04/13/2011  . Hiatal hernia 04/13/2011  . Lumbar spondylolysis 04/13/2011  . Thyroid nodule 04/13/2011  . Sleep apnea 04/13/2011  . Diabetes mellitus, type 2 ([place]) 04/13/2011  . Prostate cancer ([place]) 04/13/2011  . Kidney disease 04/13/2011  . Diabetic retinopathy ([place]) 04/13/2011  . Diabetic neuropathy ([place]) 04/13/2011   Past Medical History:  Diagnosis Date  . CAD (coronary artery disease)    (Left main normal, LAD 30-40% mid stenosis, diagonal 30-40% stenosis, circumflex 30-40% stenosis, right coronary artery 30-40% stenosis. 2003.)  . CHF (congestive heart failure) ([place])   . Chronic back pain   . Chronic headache   . Chronic kidney disease   . Chronic pain    right hip/groin, neck, back  . Dementia   . Diabetes mellitus   . Diabetic neuropathy ([place])   . Diabetic retinopathy    right eye   . DVT of leg (deep venous thrombosis) ([place]) 1999   RLE   . GERD (gastroesophageal reflux disease)   . Goiter    stable x many year - last endo exam 08/2010  . Hiatal hernia   . HTN (hypertension)   . Hyperlipidemia   . Hypertension   . Lumbar spondylosis   . Macular degeneration   . Osteopenia   . Peripheral vertigo   .  Prostate cancer ([place])   . Sleep apnea    CPAP  . Thyroid nodule   . Type 2 diabetes mellitus (HCC)     Family History  Problem Relation Age of Onset  . Hyperlipidemia Mother   . Hypertension Mother   . Deep vein thrombosis Mother   . Pulmonary embolism Mother   . Diabetes Mother   . Prostate cancer Father   . Cancer Father 60  . Colon cancer Sister   . Depression Sister   . Dementia Sister   . Dementia Sister   . Scleroderma Brother     Past Surgical History:  Procedure Laterality Date  . APPENDECTOMY    . biopsy of right ear    . CATARACT EXTRACTION, BILATERAL    .  CHOLECYSTECTOMY    . INGUINAL HERNIA REPAIR     bilateral  . INGUINAL HERNIA REPAIR     Bilateral  . INTRAMEDULLARY (IM) NAIL INTERTROCHANTERIC Left 08/01/2018   Procedure: INTRAMEDULLARY (IM) NAIL INTERTROCHANTRIC;  Surgeon: Newt Minion, MD;  Location: [facility];  Service: Orthopedics;  Laterality: Left;  . KNEE ARTHROSCOPY     left   . TONSILECTOMY, ADENOIDECTOMY, BILATERAL MYRINGOTOMY AND TUBES    . TONSILLECTOMY AND ADENOIDECTOMY     Social History   Occupational History  . Occupation: Retired    Fish farm manager: RETIRED  Tobacco Use  . Smoking status: Former Smoker    Types: Cigarettes, Pipe, Cigars    Last attempt to quit: 11/08/2003    Years since quitting: 14.7  . Smokeless tobacco: Former Systems developer    Quit date: 11/08/2003  Substance and Sexual Activity  . Alcohol use: No  . Drug use: No  . Sexual activity: Never

## 2018-08-14 ENCOUNTER — Telehealth (INDEPENDENT_AMBULATORY_CARE_PROVIDER_SITE_OTHER): Payer: Self-pay

## 2018-08-14 NOTE — Telephone Encounter (Signed)
FYI-- HH called to let [name] know they cancelled patients [name] due to amount of falls patient was having

## 2018-08-18 NOTE — Telephone Encounter (Signed)
fyi

## 2018-08-19 ENCOUNTER — Ambulatory Visit (INDEPENDENT_AMBULATORY_CARE_PROVIDER_SITE_OTHER): Payer: Self-pay

## 2018-08-19 ENCOUNTER — Ambulatory Visit (INDEPENDENT_AMBULATORY_CARE_PROVIDER_SITE_OTHER): Payer: Medicare Other | Admitting: Family

## 2018-08-19 ENCOUNTER — Ambulatory Visit (INDEPENDENT_AMBULATORY_CARE_PROVIDER_SITE_OTHER): Payer: Medicare Other

## 2018-08-19 ENCOUNTER — Encounter (INDEPENDENT_AMBULATORY_CARE_PROVIDER_SITE_OTHER): Payer: Self-pay | Admitting: Family

## 2018-08-19 VITALS — Ht 68.0 in | Wt 218.0 lb

## 2018-08-19 DIAGNOSIS — S72142A Displaced intertrochanteric fracture of left femur, initial encounter for closed fracture: Secondary | ICD-10-CM | POA: Diagnosis not present

## 2018-08-19 NOTE — Progress Notes (Signed)
Post-Op Visit Note   Patient: [name]           Date of Birth: May 27, 1930           MRN: [id number] Visit Date: 08/19/2018 PCP: [name], FNP  Chief Complaint:  Chief Complaint  Patient presents with  . Left Hip - Routine Post Op    08/01/18 left IM nail intertroch hip fx     HPI:  HPI The patient is an 84 year old gentleman who presents today status post IM nailing for left intertrochanteric hip fracture on August 3.  Is residing at skilled nursing.  Staff does report that he has frequently gotten up without assistance and has had a few falls.  Patient is poor historian due to memory issues.  Ortho Exam On examination incisions are healing well staples harvested today.  There is no drainage slight surrounding erythema no warmth no tenderness no cellulitis.  No palpable abscess.  Visit Diagnoses:  1. Displaced intertrochanteric fracture of left femur, initial encounter for closed fracture ([place])     Plan: Staples harvested.  Continue physical therapy up with therapy activities weightbearing as tolerated with assistance.  Follow-Up Instructions: No follow-ups on file.   Imaging: No results found.  Orders:  Orders Placed This Encounter  Procedures  . XR HIP UNILAT W OR W/O PELVIS 2-3 VIEWS LEFT   No orders of the defined types were placed in this encounter.    PMFS History: Patient Active Problem List   Diagnosis Date Noted  . Displaced intertrochanteric fracture of left femur, initial encounter for closed fracture ([place]) 07/30/2018  . Orthostasis 01/25/2018  . ARF (acute renal failure) ([place]) 01/25/2018  . Cervicalgia 04/26/2016  . Chronic anticoagulation 04/18/2016  . Foot pain, left 02/02/2016  . DVT (deep venous thrombosis), unspecified laterality 10/04/2015  . BMI 33.0-33.9,adult 07/25/2014  . Chest pain 07/12/2014  . Chronic back pain   . Osteopenia   . Decubitus ulcers 11/29/2013  . Tremors of nervous system 11/29/2013  . Macular  degeneration of both eyes 07/13/2013  . Hyperlipidemia   . CAD (coronary artery disease)   . HTN (hypertension) 04/13/2011  . Hiatal hernia 04/13/2011  . Lumbar spondylolysis 04/13/2011  . Thyroid nodule 04/13/2011  . Sleep apnea 04/13/2011  . Diabetes mellitus, type 2 ([place]) 04/13/2011  . Prostate cancer ([place]) 04/13/2011  . Kidney disease 04/13/2011  . Diabetic retinopathy ([place]) 04/13/2011  . Diabetic neuropathy ([place]) 04/13/2011   Past Medical History:  Diagnosis Date  . CAD (coronary artery disease)    (Left main normal, LAD 30-40% mid stenosis, diagonal 30-40% stenosis, circumflex 30-40% stenosis, right coronary artery 30-40% stenosis. 2003.)  . CHF (congestive heart failure) ([place])   . Chronic back pain   . Chronic headache   . Chronic kidney disease   . Chronic pain    right hip/groin, neck, back  . Dementia   . Diabetes mellitus   . Diabetic neuropathy ([place])   . Diabetic retinopathy    right eye   . DVT of leg (deep venous thrombosis) ([place]) 1999   RLE   . GERD (gastroesophageal reflux disease)   . Goiter    stable x many year - last endo exam 08/2010  . Hiatal hernia   . HTN (hypertension)   . Hyperlipidemia   . Hypertension   . Lumbar spondylosis   . Macular degeneration   . Osteopenia   . Peripheral vertigo   . Prostate cancer ([place])   . Sleep apnea  CPAP  . Thyroid nodule   . Type 2 diabetes mellitus (HCC)     Family History  Problem Relation Age of Onset  . Hyperlipidemia Mother   . Hypertension Mother   . Deep vein thrombosis Mother   . Pulmonary embolism Mother   . Diabetes Mother   . Prostate cancer Father   . Cancer Father 64  . Colon cancer Sister   . Depression Sister   . Dementia Sister   . Dementia Sister   . Scleroderma Brother     Past Surgical History:  Procedure Laterality Date  . APPENDECTOMY    . biopsy of right ear    . CATARACT EXTRACTION, BILATERAL    . CHOLECYSTECTOMY    . INGUINAL HERNIA REPAIR     bilateral  . INGUINAL  HERNIA REPAIR     Bilateral  . INTRAMEDULLARY (IM) NAIL INTERTROCHANTERIC Left 08/01/2018   Procedure: INTRAMEDULLARY (IM) NAIL INTERTROCHANTRIC;  Surgeon: Newt Minion, MD;  Location: [facility];  Service: Orthopedics;  Laterality: Left;  . KNEE ARTHROSCOPY     left   . TONSILECTOMY, ADENOIDECTOMY, BILATERAL MYRINGOTOMY AND TUBES    . TONSILLECTOMY AND ADENOIDECTOMY     Social History   Occupational History  . Occupation: Retired    Fish farm manager: RETIRED  Tobacco Use  . Smoking status: Former Smoker    Types: Cigarettes, Pipe, Cigars    Last attempt to quit: 11/08/2003    Years since quitting: 14.7  . Smokeless tobacco: Former Systems developer    Quit date: 11/08/2003  Substance and Sexual Activity  . Alcohol use: No  . Drug use: No  . Sexual activity: Never

## 2018-09-08 ENCOUNTER — Ambulatory Visit (INDEPENDENT_AMBULATORY_CARE_PROVIDER_SITE_OTHER): Payer: Medicare Other | Admitting: Physician Assistant

## 2018-09-08 ENCOUNTER — Encounter (INDEPENDENT_AMBULATORY_CARE_PROVIDER_SITE_OTHER): Payer: Self-pay | Admitting: Orthopedic Surgery

## 2018-09-08 VITALS — Ht 68.0 in | Wt 218.0 lb

## 2018-09-08 DIAGNOSIS — S72142D Displaced intertrochanteric fracture of left femur, subsequent encounter for closed fracture with routine healing: Secondary | ICD-10-CM

## 2018-09-08 NOTE — Progress Notes (Signed)
Office Visit Note   Patient: [name]           Date of Birth: [birth year]           MRN: [id number] Visit Date: 09/08/2018              Requested by: [name], [address] [place], [place] [address] PCP: [name], FNP  Chief Complaint  Patient presents with  . Left Hip - Routine Post Op    08/01/18 IM nail left hip       HPI: Patient is an 84 year old male who currently resides at [facility] skilled nursing who is seen for postoperative follow-up following ORIF of his displaced intertrochanteric left hip fracture with intramedullary fixation.  He reports he is working with physical therapy.  He is weightbearing as tolerated.  He does report some discomfort when working with physical therapy but otherwise is doing well.  He reports no problems with the incisional area just tenderness over the area at times.  He presents with staff from [facility] today and at the wheelchair level.  Assessment & Plan: Visit Diagnoses:  1. Displaced intertrochanteric fracture of left femur, subsequent encounter for closed fracture with routine healing     Plan: Counseled patient to continue working with physical therapy for progressive mobility, weightbearing as tolerated over the left lower extremity.  He will follow-up here in 4 weeks with x-rays at that visit.  Follow-Up Instructions: No follow-ups on file.   Ortho Exam  Patient is alert, and appropriate in general conversation.  He does have some mild dementia and is a somewhat poor historian no adenopathy, well-dressed, normal affect, normal respiratory effort. Left hip incision is healing well and without cellulitis.  Movement in the hip knee is within functional limits.  He has some mild tenderness to palpation over the hip area.  Imaging: No results found. No images are attached to the encounter.  Labs: Lab Results  Component Value Date   HGBA1C 6.8 (H) 01/25/2018   HGBA1C 7.3 12/06/2015     HGBA1C 7.3 08/04/2015   REPTSTATUS 01/28/2018 FINAL 01/25/2018   CULT >=100,000 COLONIES/mL STAPHYLOCOCCUS EPIDERMIDIS (A) 01/25/2018   LABORGA STAPHYLOCOCCUS EPIDERMIDIS (A) 01/25/2018     Lab Results  Component Value Date   ALBUMIN 3.8 07/30/2018   ALBUMIN 3.7 01/25/2018   ALBUMIN 4.2 06/06/2016    Body mass index is 33.15 kg/m.  Orders:  No orders of the defined types were placed in this encounter.  No orders of the defined types were placed in this encounter.    Procedures: No procedures performed  Clinical Data: No additional findings.  ROS:  All other systems negative, except as noted in the HPI. Review of Systems  Objective: Vital Signs: Ht 5\' 8"  (1.727 m)   Wt 218 lb (98.9 kg)   BMI 33.15 kg/m   Specialty Comments:  No specialty comments available.  PMFS History: Patient Active Problem List   Diagnosis Date Noted  . Displaced intertrochanteric fracture of left femur, initial encounter for closed fracture ([place]) 07/30/2018  . Orthostasis 01/25/2018  . ARF (acute renal failure) ([place]) 01/25/2018  . Cervicalgia 04/26/2016  . Chronic anticoagulation 04/18/2016  . Foot pain, left 02/02/2016  . DVT (deep venous thrombosis), unspecified laterality 10/04/2015  . BMI 33.0-33.9,adult 07/25/2014  . Chest pain 07/12/2014  . Chronic back pain   . Osteopenia   . Decubitus ulcers 11/29/2013  . Tremors of nervous system 11/29/2013  . Macular degeneration of  both eyes 07/13/2013  . Hyperlipidemia   . CAD (coronary artery disease)   . HTN (hypertension) 04/13/2011  . Hiatal hernia 04/13/2011  . Lumbar spondylolysis 04/13/2011  . Thyroid nodule 04/13/2011  . Sleep apnea 04/13/2011  . Diabetes mellitus, type 2 ([place]) 04/13/2011  . Prostate cancer ([place]) 04/13/2011  . Kidney disease 04/13/2011  . Diabetic retinopathy ([place]) 04/13/2011  . Diabetic neuropathy ([place]) 04/13/2011   Past Medical History:  Diagnosis Date  . CAD (coronary artery disease)    (Left  main normal, LAD 30-40% mid stenosis, diagonal 30-40% stenosis, circumflex 30-40% stenosis, right coronary artery 30-40% stenosis. 2003.)  . CHF (congestive heart failure) ([place])   . Chronic back pain   . Chronic headache   . Chronic kidney disease   . Chronic pain    right hip/groin, neck, back  . Dementia   . Diabetes mellitus   . Diabetic neuropathy ([place])   . Diabetic retinopathy    right eye   . DVT of leg (deep venous thrombosis) ([place]) 1999   RLE   . GERD (gastroesophageal reflux disease)   . Goiter    stable x many year - last endo exam 08/2010  . Hiatal hernia   . HTN (hypertension)   . Hyperlipidemia   . Hypertension   . Lumbar spondylosis   . Macular degeneration   . Osteopenia   . Peripheral vertigo   . Prostate cancer ([place])   . Sleep apnea    CPAP  . Thyroid nodule   . Type 2 diabetes mellitus (HCC)     Family History  Problem Relation Age of Onset  . Hyperlipidemia Mother   . Hypertension Mother   . Deep vein thrombosis Mother   . Pulmonary embolism Mother   . Diabetes Mother   . Prostate cancer Father   . Cancer Father 37  . Colon cancer Sister   . Depression Sister   . Dementia Sister   . Dementia Sister   . Scleroderma Brother     Past Surgical History:  Procedure Laterality Date  . APPENDECTOMY    . biopsy of right ear    . CATARACT EXTRACTION, BILATERAL    . CHOLECYSTECTOMY    . INGUINAL HERNIA REPAIR     bilateral  . INGUINAL HERNIA REPAIR     Bilateral  . INTRAMEDULLARY (IM) NAIL INTERTROCHANTERIC Left 08/01/2018   Procedure: INTRAMEDULLARY (IM) NAIL INTERTROCHANTRIC;  Surgeon: Newt Minion, MD;  Location: [facility];  Service: Orthopedics;  Laterality: Left;  . KNEE ARTHROSCOPY     left   . TONSILECTOMY, ADENOIDECTOMY, BILATERAL MYRINGOTOMY AND TUBES    . TONSILLECTOMY AND ADENOIDECTOMY     Social History   Occupational History  . Occupation: Retired    Fish farm manager: RETIRED  Tobacco Use  . Smoking status: Former Smoker    Types:  Cigarettes, Pipe, Cigars    Last attempt to quit: 11/08/2003    Years since quitting: 14.8  . Smokeless tobacco: Former Systems developer    Quit date: 11/08/2003  Substance and Sexual Activity  . Alcohol use: No  . Drug use: No  . Sexual activity: Never

## 2018-09-09 ENCOUNTER — Encounter (INDEPENDENT_AMBULATORY_CARE_PROVIDER_SITE_OTHER): Payer: Self-pay | Admitting: Physician Assistant

## 2018-09-26 ENCOUNTER — Emergency Department (HOSPITAL_COMMUNITY)
Admission: EM | Admit: 2018-09-26 | Discharge: 2018-09-26 | Disposition: A | Discharge status: Home / Self Care | Payer: Medicare Other | Attending: Emergency Medicine | Admitting: Emergency Medicine

## 2018-09-26 ENCOUNTER — Emergency Department (HOSPITAL_COMMUNITY): Payer: Medicare Other

## 2018-09-26 ENCOUNTER — Encounter (HOSPITAL_COMMUNITY): Payer: Self-pay | Admitting: Emergency Medicine

## 2018-09-26 DIAGNOSIS — E1122 Type 2 diabetes mellitus with diabetic chronic kidney disease: Secondary | ICD-10-CM | POA: Insufficient documentation

## 2018-09-26 DIAGNOSIS — J90 Pleural effusion, not elsewhere classified: Secondary | ICD-10-CM | POA: Insufficient documentation

## 2018-09-26 DIAGNOSIS — R4182 Altered mental status, unspecified: Secondary | ICD-10-CM

## 2018-09-26 DIAGNOSIS — Z9049 Acquired absence of other specified parts of digestive tract: Secondary | ICD-10-CM | POA: Insufficient documentation

## 2018-09-26 DIAGNOSIS — I959 Hypotension, unspecified: Secondary | ICD-10-CM

## 2018-09-26 DIAGNOSIS — Z87891 Personal history of nicotine dependence: Secondary | ICD-10-CM | POA: Insufficient documentation

## 2018-09-26 DIAGNOSIS — I2510 Atherosclerotic heart disease of native coronary artery without angina pectoris: Secondary | ICD-10-CM | POA: Insufficient documentation

## 2018-09-26 DIAGNOSIS — N189 Chronic kidney disease, unspecified: Secondary | ICD-10-CM | POA: Diagnosis not present

## 2018-09-26 DIAGNOSIS — I130 Hypertensive heart and chronic kidney disease with heart failure and stage 1 through stage 4 chronic kidney disease, or unspecified chronic kidney disease: Secondary | ICD-10-CM | POA: Diagnosis not present

## 2018-09-26 DIAGNOSIS — Z7984 Long term (current) use of oral hypoglycemic drugs: Secondary | ICD-10-CM | POA: Insufficient documentation

## 2018-09-26 DIAGNOSIS — Z8546 Personal history of malignant neoplasm of prostate: Secondary | ICD-10-CM | POA: Insufficient documentation

## 2018-09-26 DIAGNOSIS — Z79899 Other long term (current) drug therapy: Secondary | ICD-10-CM | POA: Insufficient documentation

## 2018-09-26 DIAGNOSIS — E860 Dehydration: Secondary | ICD-10-CM | POA: Diagnosis not present

## 2018-09-26 DIAGNOSIS — R001 Bradycardia, unspecified: Secondary | ICD-10-CM | POA: Diagnosis not present

## 2018-09-26 DIAGNOSIS — I509 Heart failure, unspecified: Secondary | ICD-10-CM | POA: Diagnosis not present

## 2018-09-26 DIAGNOSIS — F0390 Unspecified dementia without behavioral disturbance: Secondary | ICD-10-CM | POA: Insufficient documentation

## 2018-09-26 LAB — COMPREHENSIVE METABOLIC PANEL
ALK PHOS: 83 U/L (ref 38–126)
ALT: 8 U/L (ref 0–44)
AST: 13 U/L — ABNORMAL LOW (ref 15–41)
Albumin: 3.3 g/dL — ABNORMAL LOW (ref 3.5–5.0)
Anion gap: 5 (ref 5–15)
BUN: 17 mg/dL (ref 8–23)
CALCIUM: 8.2 mg/dL — AB (ref 8.9–10.3)
CO2: 29 mmol/L (ref 22–32)
CREATININE: 1.09 mg/dL (ref 0.61–1.24)
Chloride: 102 mmol/L (ref 98–111)
GFR, EST NON AFRICAN AMERICAN: 59 mL/min — AB (ref >60)
Glucose, Bld: 164 mg/dL — ABNORMAL HIGH (ref 70–99)
Potassium: 3.8 mmol/L (ref 3.5–5.1)
Sodium: 136 mmol/L (ref 135–145)
Total Bilirubin: 0.4 mg/dL (ref 0.3–1.2)
Total Protein: 6.3 g/dL — ABNORMAL LOW (ref 6.5–8.1)

## 2018-09-26 LAB — URINALYSIS, ROUTINE W REFLEX MICROSCOPIC
BACTERIA UA: NONE SEEN
BILIRUBIN URINE: NEGATIVE
Glucose, UA: NEGATIVE mg/dL
Ketones, ur: NEGATIVE mg/dL
Leukocytes, UA: NEGATIVE
NITRITE: NEGATIVE
PH: 5 (ref 5.0–8.0)
Protein, ur: NEGATIVE mg/dL
SPECIFIC GRAVITY, URINE: 1.02 (ref 1.005–1.030)

## 2018-09-26 LAB — CBC WITH DIFFERENTIAL/PLATELET
Basophils Absolute: 0 10*3/uL (ref 0.0–0.1)
Basophils Relative: 1 %
Eosinophils Absolute: 0.1 10*3/uL (ref 0.0–0.7)
Eosinophils Relative: 3 %
HCT: 36.8 % — ABNORMAL LOW (ref 39.0–52.0)
HEMOGLOBIN: 11.8 g/dL — AB (ref 13.0–17.0)
LYMPHS ABS: 3.2 10*3/uL (ref 0.7–4.0)
LYMPHS PCT: 58 %
MCH: 30.1 pg (ref 26.0–34.0)
MCHC: 32.1 g/dL (ref 30.0–36.0)
MCV: 93.9 fL (ref 78.0–100.0)
Monocytes Absolute: 0.3 10*3/uL (ref 0.1–1.0)
Monocytes Relative: 5 %
Neutro Abs: 1.8 10*3/uL (ref 1.7–7.7)
Neutrophils Relative %: 33 %
Platelets: 186 10*3/uL (ref 150–400)
RBC: 3.92 MIL/uL — AB (ref 4.22–5.81)
RDW: 13.7 % (ref 11.5–15.5)
WBC: 5.4 10*3/uL (ref 4.0–10.5)

## 2018-09-26 LAB — TROPONIN I

## 2018-09-26 LAB — CBG MONITORING, ED: Glucose-Capillary: 151 mg/dL — ABNORMAL HIGH (ref 70–99)

## 2018-09-26 LAB — I-STAT CG4 LACTIC ACID, ED: Lactic Acid, Venous: 1.47 mmol/L (ref 0.5–1.9)

## 2018-09-26 MED ORDER — SODIUM CHLORIDE 0.9 % IV BOLUS
1000.0000 mL | Freq: Once | INTRAVENOUS | Status: AC
  Administered 2018-09-26: 1000 mL via INTRAVENOUS

## 2018-09-26 NOTE — ED Notes (Signed)
Fentanyl 47mcg patch noted to pts left shoulder.  Left intact and notified physician.  Daughter is requesting basic evaluation and d/c back to nsg facility.  Pt is alert at this time.

## 2018-09-26 NOTE — ED Notes (Signed)
Report given to [facility].

## 2018-09-26 NOTE — Discharge Instructions (Signed)
Hold norvasc.

## 2018-09-26 NOTE — ED Notes (Signed)
Pt pulled to hallway due to attempting to climb out of bed, pt reoriented, comfort measures provided,

## 2018-09-26 NOTE — ED Provider Notes (Signed)
[facility] EMERGENCY DEPARTMENT Provider Note   CSN: [id number] Arrival date & time: 09/26/18  1452     History   Chief Complaint Chief Complaint  Patient presents with  . Hypotension  . Altered Mental Status    HPI [name] is a 84 y.o. male.  Pt presents to the ED today with altered mental status.  EMS was called out to SNF for AMS.  When EMS arrived, his sbp was 70.  He was diaphoretic.  EMS gave him 500 cc NS bolus and he became more responsive, less diaphoretic as his bp increased.  Pt has dementia and is a poor historian.  He said "everything hurts."  No known falls.     Past Medical History:  Diagnosis Date  . CAD (coronary artery disease)    (Left main normal, LAD 30-40% mid stenosis, diagonal 30-40% stenosis, circumflex 30-40% stenosis, right coronary artery 30-40% stenosis. 2003.)  . CHF (congestive heart failure) ([place])   . Chronic back pain   . Chronic headache   . Chronic kidney disease   . Chronic pain    right hip/groin, neck, back  . Dementia   . Diabetes mellitus   . Diabetic neuropathy ([place])   . Diabetic retinopathy    right eye   . DVT of leg (deep venous thrombosis) ([place]) 1999   RLE   . GERD (gastroesophageal reflux disease)   . Goiter    stable x many year - last endo exam 08/2010  . Hiatal hernia   . HTN (hypertension)   . Hyperlipidemia   . Hypertension   . Lumbar spondylosis   . Macular degeneration   . Osteopenia   . Peripheral vertigo   . Prostate cancer ([place])   . Sleep apnea    CPAP  . Thyroid nodule   . Type 2 diabetes mellitus [facility])     Patient Active Problem List   Diagnosis Date Noted  . Displaced intertrochanteric fracture of left femur, initial encounter for closed fracture ([place]) 07/30/2018  . Orthostasis 01/25/2018  . ARF (acute renal failure) ([place]) 01/25/2018  . Cervicalgia 04/26/2016  . Chronic anticoagulation 04/18/2016  . Foot pain, left 02/02/2016  . DVT (deep venous thrombosis), unspecified laterality  10/04/2015  . BMI 33.0-33.9,adult 07/25/2014  . Chest pain 07/12/2014  . Chronic back pain   . Osteopenia   . Decubitus ulcers 11/29/2013  . Tremors of nervous system 11/29/2013  . Macular degeneration of both eyes 07/13/2013  . Hyperlipidemia   . CAD (coronary artery disease)   . HTN (hypertension) 04/13/2011  . Hiatal hernia 04/13/2011  . Lumbar spondylolysis 04/13/2011  . Thyroid nodule 04/13/2011  . Sleep apnea 04/13/2011  . Diabetes mellitus, type 2 ([place]) 04/13/2011  . Prostate cancer ([place]) 04/13/2011  . Kidney disease 04/13/2011  . Diabetic retinopathy ([place]) 04/13/2011  . Diabetic neuropathy ([place]) 04/13/2011    Past Surgical History:  Procedure Laterality Date  . APPENDECTOMY    . biopsy of right ear    . CATARACT EXTRACTION, BILATERAL    . CHOLECYSTECTOMY    . INGUINAL HERNIA REPAIR     bilateral  . INGUINAL HERNIA REPAIR     Bilateral  . INTRAMEDULLARY (IM) NAIL INTERTROCHANTERIC Left 08/01/2018   Procedure: INTRAMEDULLARY (IM) NAIL INTERTROCHANTRIC;  Surgeon: Newt Minion, MD;  Location: [place];  Service: Orthopedics;  Laterality: Left;  . KNEE ARTHROSCOPY     left   . TONSILECTOMY, ADENOIDECTOMY, BILATERAL MYRINGOTOMY AND TUBES    . TONSILLECTOMY  AND ADENOIDECTOMY          Home Medications    Prior to Admission medications   Medication Sig Start Date End Date Taking? Authorizing Provider  acetaminophen (TYLENOL) 500 MG tablet Take 500 mg by mouth 2 (two) times daily.   Yes [provider]  Amino Acids-Protein Hydrolys (FEEDING SUPPLEMENT, PRO-STAT SUGAR FREE 64,) LIQD Take 30 mLs by mouth 2 (two) times daily.   Yes [provider]  donepezil (ARICEPT) 10 MG tablet Take 10 mg by mouth at bedtime.   Yes [provider]  fentaNYL (DURAGESIC - DOSED MCG/HR) 25 MCG/HR patch Place 25 mcg onto the skin every 3 (three) days.   Yes [provider]  finasteride (PROSCAR) 5 MG tablet Take 1 tablet (5 mg total) by mouth daily.  12/06/15  Yes Martin, Mary-Margaret, FNP  loratadine (CLARITIN) 10 MG tablet Take 10 mg by mouth daily as needed for allergies.   Yes [provider]  magnesium hydroxide (MILK OF MAGNESIA) 400 MG/5ML suspension Take 30 mLs by mouth daily as needed for mild constipation.   Yes [provider]  Melatonin 3 MG TABS Take 1 tablet by mouth at bedtime.   Yes [provider]  Menthol, Topical Analgesic, (BIOFREEZE EX) Apply 1 application topically every 6 (six) hours as needed (back, side, and shoulder pain).    Yes [provider]  metFORMIN (GLUCOPHAGE) 500 MG tablet Take 500 mg by mouth every morning.   Yes [provider]  omeprazole (PRILOSEC) 20 MG capsule Take 1 capsule (20 mg total) by mouth daily. 12/06/15  Yes Hassell Done, Mary-Margaret, FNP  oxycodone (OXY-IR) 5 MG capsule Take 1 capsule (5 mg total) by mouth every 8 (eight) hours as needed for pain. Patient taking differently: Take 2.5 mg by mouth every 8 (eight) hours as needed for pain.  08/04/18  Yes Elgergawy, Silver Huguenin, MD  Polyethyl Glycol-Propyl Glycol (SYSTANE) 0.4-0.3 % SOLN Place 1 drop into the right eye every 6 (six) hours as needed (dry eyes).    Yes [provider]  polyethylene glycol (MIRALAX / GLYCOLAX) packet Take 17 g by mouth 2 (two) times daily.   Yes [provider]  tobramycin (TOBREX) 0.3 % ophthalmic solution Place 1 drop into both eyes 4 (four) times daily. 2 day course starting on 09/24/2018 for infection   Yes [provider]  apixaban (ELIQUIS) 5 MG TABS tablet Take 1 tablet (5 mg total) by mouth 2 (two) times daily. Patient not taking: Reported on 09/26/2018 08/04/18   Elgergawy, Silver Huguenin, MD  fentaNYL (DURAGESIC - DOSED MCG/HR) 50 MCG/HR Place 1 patch (50 mcg total) onto the skin every 3 (three) days. Patient not taking: Reported on 09/26/2018 08/04/18   Elgergawy, Silver Huguenin, MD  metoprolol tartrate (LOPRESSOR) 25 MG tablet Take 1.5 tablets (37.5 mg total) by  mouth 2 (two) times daily. Patient not taking: Reported on 09/26/2018 08/04/18   Elgergawy, Silver Huguenin, MD    Family History Family History  Problem Relation Age of Onset  . Hyperlipidemia Mother   . Hypertension Mother   . Deep vein thrombosis Mother   . Pulmonary embolism Mother   . Diabetes Mother   . Prostate cancer Father   . Cancer Father 37  . Colon cancer Sister   . Depression Sister   . Dementia Sister   . Dementia Sister   . Scleroderma Brother     Social History Social History   Tobacco Use  . Smoking status: Former  Smoker    Types: Cigarettes, Pipe, Cigars    Last attempt to quit: 11/08/2003    Years since quitting: 14.8  . Smokeless tobacco: Former Systems developer    Quit date: 11/08/2003  Substance Use Topics  . Alcohol use: No  . Drug use: No     Allergies   Actos [pioglitazone]; Bactrim; Doxycycline; Flagyl [metronidazole hcl]; Gabapentin; Lyrica [pregabalin]; Motrin [ibuprofen]; Nsaids; Ultram [tramadol hcl]; Vioxx [rofecoxib]; Celebrex [celecoxib]; Celebrex [celecoxib]; and Morphine and related   Review of Systems Review of Systems  Unable to perform ROS: Dementia     Physical Exam Updated Vital Signs BP 113/83   Pulse (!) 38   Temp 97.9 F (36.6 C) (Oral)   Resp 14   Ht 5\' 8"  (1.727 m)   Wt 99 kg   SpO2 98%   BMI 33.19 kg/m   Physical Exam  Constitutional: He appears well-developed and well-nourished. He appears lethargic.  HENT:  Head: Normocephalic and atraumatic.  Right Ear: External ear normal.  Left Ear: External ear normal.  Nose: Nose normal.  Mouth/Throat: Oropharynx is clear and moist.  Eyes: Pupils are equal, round, and reactive to light. Conjunctivae and EOM are normal.  Neck: Normal range of motion. Neck supple.  Cardiovascular: Regular rhythm, normal heart sounds and intact distal pulses. Bradycardia present.  Pulmonary/Chest: Effort normal and breath sounds normal.  Abdominal: Soft. Bowel sounds are normal.  Musculoskeletal:  Normal range of motion.  Neurological: He appears lethargic.  Pt will wake up to talk, but then falls back asleep.  He is moving all 4 extremities.  He knows his name, but is otherwise not oriented.  Skin: Skin is warm and dry. Capillary refill takes less than 2 seconds.  Psychiatric: He has a normal mood and affect.  Nursing note and vitals reviewed.    ED Treatments / Results  Labs (all labs ordered are listed, but only abnormal results are displayed) Labs Reviewed  CBC WITH DIFFERENTIAL/PLATELET - Abnormal; Notable for the following components:      Result Value   RBC 3.92 (*)    Hemoglobin 11.8 (*)    HCT 36.8 (*)    All other components within normal limits  COMPREHENSIVE METABOLIC PANEL - Abnormal; Notable for the following components:   Glucose, Bld 164 (*)    Calcium 8.2 (*)    Total Protein 6.3 (*)    Albumin 3.3 (*)    AST 13 (*)    GFR calc non Af Amer 59 (*)    All other components within normal limits  URINALYSIS, ROUTINE W REFLEX MICROSCOPIC - Abnormal; Notable for the following components:   Hgb urine dipstick SMALL (*)    All other components within normal limits  CBG MONITORING, ED - Abnormal; Notable for the following components:   Glucose-Capillary 151 (*)    All other components within normal limits  CULTURE, BLOOD (ROUTINE X 2)  CULTURE, BLOOD (ROUTINE X 2)  TROPONIN I  I-STAT CG4 LACTIC ACID, ED    EKG EKG Interpretation  Date/Time:  Saturday September 26 2018 14:59:36 EDT Ventricular Rate:  41 PR Interval:    QRS Duration: 106 QT Interval:  554 QTC Calculation: 458 R Axis:   -36 Text Interpretation:  Sinus bradycardia Prolonged PR interval Left axis deviation Low voltage, precordial leads Since last tracing rate slower Confirmed by Isla Pence [phone]) on 09/26/2018 3:15:48 PM   Radiology Dg Chest 1 View  Result Date: 09/26/2018 CLINICAL DATA:  LEFT level consciousness EXAM: CHEST  1 VIEW COMPARISON:  07/30/2018 FINDINGS: Normal cardiac  silhouette. Obscuration LEFT hemidiaphragm laterally. Upper lobes appear clear. No pneumothorax. No pulmonary edema. IMPRESSION: Unilateral LEFT pleural effusion with atelectasis or infiltrate. Consider PA and lateral radiograph or CT for further evaluation. Electronically Signed   By: Suzy Bouchard M.D.   On: 09/26/2018 16:35   Ct Head Wo Contrast  Result Date: 09/26/2018 CLINICAL DATA:  Altered mental status. EXAM: CT HEAD WITHOUT CONTRAST TECHNIQUE: Contiguous axial images were obtained from the base of the skull through the vertex without intravenous contrast. COMPARISON:  Head CT 07/30/2018 FINDINGS: Brain: There is no mass, hemorrhage or extra-axial collection. There is generalized atrophy without lobar predilection. There is hypoattenuation of the periventricular white matter, most commonly indicating chronic ischemic microangiopathy. Vascular: Atherosclerotic calcification of the internal carotid arteries at the skull base. No abnormal hyperdensity of the major intracranial arteries or dural venous sinuses. Skull: The visualized skull base, calvarium and extracranial soft tissues are normal. Sinuses/Orbits: No fluid levels or advanced mucosal thickening of the visualized paranasal sinuses. No mastoid or middle ear effusion. The orbits are normal. IMPRESSION: Generalized atrophy and chronic ischemic microangiopathy without acute intracranial abnormality. Electronically Signed   By: Ulyses Jarred M.D.   On: 09/26/2018 17:06    Procedures Procedures (including critical care time)  Medications Ordered in ED Medications  sodium chloride 0.9 % bolus 1,000 mL (0 mLs Intravenous Stopped 09/26/18 1732)     Initial Impression / Assessment and Plan / ED Course  I have reviewed the triage vital signs and the nursing notes.  Pertinent labs & imaging results that were available during my care of the patient were reviewed by me and considered in my medical decision making (see chart for details).      Pt is awake and alert now.  Per daughter, he is at his baseline.  She does not want him admitted unless absolutely necessary as he gets very combative due to sundowning from dementia.  I think pt can go back to the SNF safely.  HR is now in the upper 40s and bp is normal.    I will ask them to hold norvasc.  He will also need to f/u with cardiology in case a pacemaker is needed.  He does have a small pleural effusion, but he is not sob and is saturating 98-100%.  Final Clinical Impressions(s) / ED Diagnoses   Final diagnoses:  Hypotension, unspecified hypotension type  Bradycardia  Pleural effusion on left  Dehydration    ED Discharge Orders    None       Isla Pence, MD 09/26/18 2248

## 2018-09-26 NOTE — ED Triage Notes (Signed)
Pt brought in by ems for altered loc for unknown amt of time, hypotension, and bradycardia.  Pt is alert and pleasantly confused.  Pt was diaphoretic on ems arrival.  Given NS 561ml by ems with improvement in BP.

## 2018-09-26 NOTE — ED Notes (Signed)
Pt states he does not need to urinate at this time, aware of DO  

## 2018-09-26 NOTE — ED Notes (Signed)
Called [facility] to transport Pt back to [facility]. " It will be after shift change".

## 2018-10-01 LAB — CULTURE, BLOOD (ROUTINE X 2)
Culture: NO GROWTH
Special Requests: ADEQUATE

## 2018-10-08 ENCOUNTER — Ambulatory Visit (INDEPENDENT_AMBULATORY_CARE_PROVIDER_SITE_OTHER): Payer: Medicare Other

## 2018-10-08 ENCOUNTER — Encounter (INDEPENDENT_AMBULATORY_CARE_PROVIDER_SITE_OTHER): Payer: Self-pay | Admitting: Orthopedic Surgery

## 2018-10-08 ENCOUNTER — Ambulatory Visit (INDEPENDENT_AMBULATORY_CARE_PROVIDER_SITE_OTHER): Payer: Medicare Other | Admitting: Physician Assistant

## 2018-10-08 VITALS — Ht 68.0 in | Wt 218.0 lb

## 2018-10-08 DIAGNOSIS — Z8781 Personal history of (healed) traumatic fracture: Secondary | ICD-10-CM

## 2018-10-08 DIAGNOSIS — Z9889 Other specified postprocedural states: Secondary | ICD-10-CM

## 2018-10-08 DIAGNOSIS — S72142D Displaced intertrochanteric fracture of left femur, subsequent encounter for closed fracture with routine healing: Secondary | ICD-10-CM | POA: Diagnosis not present

## 2018-10-08 NOTE — Progress Notes (Signed)
Office Visit Note   Patient: [name]           Date of Birth: 12/24/1930           MRN: [id number] Visit Date: 10/08/2018              Requested by: [name], [address] [place], [place] [address] PCP: [name], FNP  Chief Complaint  Patient presents with  . Left Hip - Routine Post Op      HPI: The patient is an 84 year old male who resides at [facility] skilled nursing facility who is seen for postoperative follow-up following ORIF of his left intertrochanteric hip fracture.  He underwent intramedullary fixation.  He has a history of dementia and is brought in today with the staff from [facility].  He reports some pain over the left hip at times.  The nursing staff reports that he has been up ambulating without complaint to the restroom.  Assessment & Plan: Visit Diagnoses:  1. Status post-operative repair of closed fracture of left hip   2. Displaced intertrochanteric fracture of left femur, subsequent encounter for closed fracture with routine healing     Plan: Patient may resume all activities to tolerance.  He can be weightbearing as tolerated without an assistive device.  He will follow-up here on as-needed basis.  Follow-Up Instructions: Return if symptoms worsen or fail to improve.   Ortho Exam  Patient is alert, oriented, no adenopathy, well-dressed, he is very hard of hearing and has a blunted affect, normal respiratory effort. Left hip incision is healing well without signs of cellulitis.  No edema.  Movement of the left hip and knee are within functional limits.  He is nontender to palpation over the left hip area.  Imaging: Xr Hip Unilat W Or W/o Pelvis 2-3 Views Left  Result Date: 10/08/2018 Left hip x-rays show good position and alignment of hardware with healing of the intertrochanteric hip fracture.  No images are attached to the encounter.  Labs: Lab Results  Component Value Date   HGBA1C 6.8 (H)  01/25/2018   HGBA1C 7.3 12/06/2015   HGBA1C 7.3 08/04/2015   REPTSTATUS 10/01/2018 FINAL 09/26/2018   CULT  09/26/2018    NO GROWTH 5 DAYS Performed at [facility], [address] [place], [place] [address]    LABORGA STAPHYLOCOCCUS EPIDERMIDIS (A) 01/25/2018     Lab Results  Component Value Date   ALBUMIN 3.3 (L) 09/26/2018   ALBUMIN 3.8 07/30/2018   ALBUMIN 3.7 01/25/2018    Body mass index is 33.15 kg/m.  Orders:  Orders Placed This Encounter  Procedures  . XR HIP UNILAT W OR W/O PELVIS 2-3 VIEWS LEFT   No orders of the defined types were placed in this encounter.    Procedures: No procedures performed  Clinical Data: No additional findings.  ROS:  All other systems negative, except as noted in the HPI. Review of Systems  Objective: Vital Signs: Ht 5\' 8"  (1.727 m)   Wt 218 lb (98.9 kg)   BMI 33.15 kg/m   Specialty Comments:  No specialty comments available.  PMFS History: Patient Active Problem List   Diagnosis Date Noted  . Displaced intertrochanteric fracture of left femur, initial encounter for closed fracture ([place]) 07/30/2018  . Orthostasis 01/25/2018  . ARF (acute renal failure) ([place]) 01/25/2018  . Cervicalgia 04/26/2016  . Chronic anticoagulation 04/18/2016  . Foot pain, left 02/02/2016  . DVT (deep venous thrombosis), unspecified laterality 10/04/2015  .  BMI 33.0-33.9,adult 07/25/2014  . Chest pain 07/12/2014  . Chronic back pain   . Osteopenia   . Decubitus ulcers 11/29/2013  . Tremors of nervous system 11/29/2013  . Macular degeneration of both eyes 07/13/2013  . Hyperlipidemia   . CAD (coronary artery disease)   . HTN (hypertension) 04/13/2011  . Hiatal hernia 04/13/2011  . Lumbar spondylolysis 04/13/2011  . Thyroid nodule 04/13/2011  . Sleep apnea 04/13/2011  . Diabetes mellitus, type 2 ([place]) 04/13/2011  . Prostate cancer ([place]) 04/13/2011  . Kidney disease 04/13/2011  . Diabetic retinopathy ([place]) 04/13/2011  . Diabetic  neuropathy ([place]) 04/13/2011   Past Medical History:  Diagnosis Date  . CAD (coronary artery disease)    (Left main normal, LAD 30-40% mid stenosis, diagonal 30-40% stenosis, circumflex 30-40% stenosis, right coronary artery 30-40% stenosis. 2003.)  . CHF (congestive heart failure) ([place])   . Chronic back pain   . Chronic headache   . Chronic kidney disease   . Chronic pain    right hip/groin, neck, back  . Dementia ([place])   . Diabetes mellitus   . Diabetic neuropathy ([place])   . Diabetic retinopathy    right eye   . DVT of leg (deep venous thrombosis) ([place]) 1999   RLE   . GERD (gastroesophageal reflux disease)   . Goiter    stable x many year - last endo exam 08/2010  . Hiatal hernia   . HTN (hypertension)   . Hyperlipidemia   . Hypertension   . Lumbar spondylosis   . Macular degeneration   . Osteopenia   . Peripheral vertigo   . Prostate cancer ([place])   . Sleep apnea    CPAP  . Thyroid nodule   . Type 2 diabetes mellitus ([facility])     Family History  Problem Relation Age of Onset  . Hyperlipidemia Mother   . Hypertension Mother   . Deep vein thrombosis Mother   . Pulmonary embolism Mother   . Diabetes Mother   . Prostate cancer Father   . Cancer Father 49  . Colon cancer Sister   . Depression Sister   . Dementia Sister   . Dementia Sister   . Scleroderma Brother     Past Surgical History:  Procedure Laterality Date  . APPENDECTOMY    . biopsy of right ear    . CATARACT EXTRACTION, BILATERAL    . CHOLECYSTECTOMY    . INGUINAL HERNIA REPAIR     bilateral  . INGUINAL HERNIA REPAIR     Bilateral  . INTRAMEDULLARY (IM) NAIL INTERTROCHANTERIC Left 08/01/2018   Procedure: INTRAMEDULLARY (IM) NAIL INTERTROCHANTRIC;  Surgeon: Newt Minion, MD;  Location: [place];  Service: Orthopedics;  Laterality: Left;  . KNEE ARTHROSCOPY     left   . TONSILECTOMY, ADENOIDECTOMY, BILATERAL MYRINGOTOMY AND TUBES    . TONSILLECTOMY AND ADENOIDECTOMY     Social History   Occupational  History  . Occupation: Retired    Fish farm manager: RETIRED  Tobacco Use  . Smoking status: Former Smoker    Types: Cigarettes, Pipe, Cigars    Last attempt to quit: 11/08/2003    Years since quitting: 14.9  . Smokeless tobacco: Former Systems developer    Quit date: 11/08/2003  Substance and Sexual Activity  . Alcohol use: No  . Drug use: No  . Sexual activity: Never

## 2018-10-28 ENCOUNTER — Ambulatory Visit: Payer: Medicare Other | Admitting: Cardiovascular Disease

## 2018-11-06 ENCOUNTER — Ambulatory Visit: Payer: Medicare Other | Admitting: Cardiovascular Disease

## 2019-01-30 ENCOUNTER — Emergency Department (HOSPITAL_COMMUNITY)
Admission: EM | Admit: 2019-01-30 | Discharge: 2019-01-30 | Disposition: A | Discharge status: Home / Self Care | Payer: Medicare Other | Attending: Emergency Medicine | Admitting: Emergency Medicine

## 2019-01-30 ENCOUNTER — Emergency Department (HOSPITAL_COMMUNITY): Payer: Medicare Other

## 2019-01-30 ENCOUNTER — Other Ambulatory Visit: Payer: Self-pay

## 2019-01-30 ENCOUNTER — Encounter (HOSPITAL_COMMUNITY): Payer: Self-pay | Admitting: Emergency Medicine

## 2019-01-30 DIAGNOSIS — S42211D Unspecified displaced fracture of surgical neck of right humerus, subsequent encounter for fracture with routine healing: Secondary | ICD-10-CM

## 2019-01-30 DIAGNOSIS — N189 Chronic kidney disease, unspecified: Secondary | ICD-10-CM | POA: Diagnosis not present

## 2019-01-30 DIAGNOSIS — S01111A Laceration without foreign body of right eyelid and periocular area, initial encounter: Secondary | ICD-10-CM

## 2019-01-30 DIAGNOSIS — F0390 Unspecified dementia without behavioral disturbance: Secondary | ICD-10-CM | POA: Insufficient documentation

## 2019-01-30 DIAGNOSIS — Z7901 Long term (current) use of anticoagulants: Secondary | ICD-10-CM | POA: Diagnosis not present

## 2019-01-30 DIAGNOSIS — I110 Hypertensive heart disease with heart failure: Secondary | ICD-10-CM | POA: Diagnosis not present

## 2019-01-30 DIAGNOSIS — E1122 Type 2 diabetes mellitus with diabetic chronic kidney disease: Secondary | ICD-10-CM | POA: Diagnosis not present

## 2019-01-30 DIAGNOSIS — Z87891 Personal history of nicotine dependence: Secondary | ICD-10-CM | POA: Diagnosis not present

## 2019-01-30 DIAGNOSIS — Y939 Activity, unspecified: Secondary | ICD-10-CM | POA: Diagnosis not present

## 2019-01-30 DIAGNOSIS — I509 Heart failure, unspecified: Secondary | ICD-10-CM | POA: Diagnosis not present

## 2019-01-30 DIAGNOSIS — Z7984 Long term (current) use of oral hypoglycemic drugs: Secondary | ICD-10-CM | POA: Insufficient documentation

## 2019-01-30 DIAGNOSIS — Z79899 Other long term (current) drug therapy: Secondary | ICD-10-CM | POA: Insufficient documentation

## 2019-01-30 DIAGNOSIS — W0110XA Fall on same level from slipping, tripping and stumbling with subsequent striking against unspecified object, initial encounter: Secondary | ICD-10-CM | POA: Diagnosis not present

## 2019-01-30 DIAGNOSIS — Y92129 Unspecified place in nursing home as the place of occurrence of the external cause: Secondary | ICD-10-CM | POA: Diagnosis not present

## 2019-01-30 DIAGNOSIS — I129 Hypertensive chronic kidney disease with stage 1 through stage 4 chronic kidney disease, or unspecified chronic kidney disease: Secondary | ICD-10-CM | POA: Insufficient documentation

## 2019-01-30 DIAGNOSIS — Y999 Unspecified external cause status: Secondary | ICD-10-CM | POA: Diagnosis not present

## 2019-01-30 DIAGNOSIS — Z8546 Personal history of malignant neoplasm of prostate: Secondary | ICD-10-CM | POA: Insufficient documentation

## 2019-01-30 DIAGNOSIS — S40921A Unspecified superficial injury of right upper arm, initial encounter: Secondary | ICD-10-CM | POA: Diagnosis present

## 2019-01-30 DIAGNOSIS — I2510 Atherosclerotic heart disease of native coronary artery without angina pectoris: Secondary | ICD-10-CM | POA: Insufficient documentation

## 2019-01-30 DIAGNOSIS — W19XXXA Unspecified fall, initial encounter: Secondary | ICD-10-CM

## 2019-01-30 DIAGNOSIS — S0181XA Laceration without foreign body of other part of head, initial encounter: Secondary | ICD-10-CM | POA: Insufficient documentation

## 2019-01-30 DIAGNOSIS — S42211A Unspecified displaced fracture of surgical neck of right humerus, initial encounter for closed fracture: Secondary | ICD-10-CM | POA: Insufficient documentation

## 2019-01-30 NOTE — ED Provider Notes (Signed)
[facility] EMERGENCY DEPARTMENT Provider Note   CSN: [id number] Arrival date & time: 01/30/19  2563     History   Chief Complaint Chief Complaint  Patient presents with  . Fall   Level 5 caveat for dementia  HPI Truitt E Kenna is a 85 y.o. male.  HPI patient presents from his nursing home.  He states the top of his head hurts and he "hurts all over".  He had an x-ray done at the facility that showed an acute nondisplaced humeral fracture of the greater trochanter.  He states he has some pain in his left hip.  He when asked states his right arm hurts.  He is noted to hold it still at his side.  He also has a small laceration with Peri-Strips already applied to the lateral right eyebrow area.  PCP  Dr Mal Amabile   Patient is DO NOT RESUSCITATE  Past Medical History:  Diagnosis Date  . CAD (coronary artery disease)    (Left main normal, LAD 30-40% mid stenosis, diagonal 30-40% stenosis, circumflex 30-40% stenosis, right coronary artery 30-40% stenosis. 2003.)  . CHF (congestive heart failure) ([place])   . Chronic back pain   . Chronic headache   . Chronic kidney disease   . Chronic pain    right hip/groin, neck, back  . Dementia ([place])   . Diabetes mellitus   . Diabetic neuropathy ([place])   . Diabetic retinopathy    right eye   . DVT of leg (deep venous thrombosis) ([place]) 1999   RLE   . GERD (gastroesophageal reflux disease)   . Goiter    stable x many year - last endo exam 08/2010  . Hiatal hernia   . HTN (hypertension)   . Hyperlipidemia   . Hypertension   . Lumbar spondylosis   . Macular degeneration   . Osteopenia   . Peripheral vertigo   . Prostate cancer ([place])   . Sleep apnea    CPAP  . Thyroid nodule   . Type 2 diabetes mellitus [facility])     Patient Active Problem List   Diagnosis Date Noted  . Displaced intertrochanteric fracture of left femur, initial encounter for closed fracture ([place]) 07/30/2018  . Orthostasis 01/25/2018  . ARF (acute renal failure) ([place])  01/25/2018  . Cervicalgia 04/26/2016  . Chronic anticoagulation 04/18/2016  . Foot pain, left 02/02/2016  . DVT (deep venous thrombosis), unspecified laterality 10/04/2015  . BMI 33.0-33.9,adult 07/25/2014  . Chest pain 07/12/2014  . Chronic back pain   . Osteopenia   . Decubitus ulcers 11/29/2013  . Tremors of nervous system 11/29/2013  . Macular degeneration of both eyes 07/13/2013  . Hyperlipidemia   . CAD (coronary artery disease)   . HTN (hypertension) 04/13/2011  . Hiatal hernia 04/13/2011  . Lumbar spondylolysis 04/13/2011  . Thyroid nodule 04/13/2011  . Sleep apnea 04/13/2011  . Diabetes mellitus, type 2 ([place]) 04/13/2011  . Prostate cancer ([place]) 04/13/2011  . Kidney disease 04/13/2011  . Diabetic retinopathy ([place]) 04/13/2011  . Diabetic neuropathy ([place]) 04/13/2011    Past Surgical History:  Procedure Laterality Date  . APPENDECTOMY    . biopsy of right ear    . CATARACT EXTRACTION, BILATERAL    . CHOLECYSTECTOMY    . INGUINAL HERNIA REPAIR     bilateral  . INGUINAL HERNIA REPAIR     Bilateral  . INTRAMEDULLARY (IM) NAIL INTERTROCHANTERIC Left 08/01/2018   Procedure: INTRAMEDULLARY (IM) NAIL INTERTROCHANTRIC;  Surgeon: Newt Minion, MD;  Location:  [facility];  Service: Orthopedics;  Laterality: Left;  . KNEE ARTHROSCOPY     left   . TONSILECTOMY, ADENOIDECTOMY, BILATERAL MYRINGOTOMY AND TUBES    . TONSILLECTOMY AND ADENOIDECTOMY          Home Medications    Prior to Admission medications   Medication Sig Start Date End Date Taking? Authorizing Provider  acetaminophen (TYLENOL) 500 MG tablet Take 500 mg by mouth 2 (two) times daily.    [provider]  Amino Acids-Protein Hydrolys (FEEDING SUPPLEMENT, PRO-STAT SUGAR FREE 64,) LIQD Take 30 mLs by mouth 2 (two) times daily.    [provider]  apixaban (ELIQUIS) 5 MG TABS tablet Take 1 tablet (5 mg total) by mouth 2 (two) times daily. 08/04/18   Elgergawy, Silver Huguenin, MD  donepezil (ARICEPT) 10 MG  tablet Take 10 mg by mouth at bedtime.    [provider]  fentaNYL (DURAGESIC - DOSED MCG/HR) 25 MCG/HR patch Place 25 mcg onto the skin every 3 (three) days.    [provider]  fentaNYL (DURAGESIC - DOSED MCG/HR) 50 MCG/HR Place 1 patch (50 mcg total) onto the skin every 3 (three) days. 08/04/18   Elgergawy, Silver Huguenin, MD  finasteride (PROSCAR) 5 MG tablet Take 1 tablet (5 mg total) by mouth daily. 12/06/15   Hassell Done, Mary-Margaret, FNP  loratadine (CLARITIN) 10 MG tablet Take 10 mg by mouth daily as needed for allergies.    [provider]  magnesium hydroxide (MILK OF MAGNESIA) 400 MG/5ML suspension Take 30 mLs by mouth daily as needed for mild constipation.    [provider]  Melatonin 3 MG TABS Take 1 tablet by mouth at bedtime.    [provider]  Menthol, Topical Analgesic, (BIOFREEZE EX) Apply 1 application topically every 6 (six) hours as needed (back, side, and shoulder pain).     [provider]  metFORMIN (GLUCOPHAGE) 500 MG tablet Take 500 mg by mouth every morning.    [provider]  metoprolol tartrate (LOPRESSOR) 25 MG tablet Take 1.5 tablets (37.5 mg total) by mouth 2 (two) times daily. 08/04/18   Elgergawy, Silver Huguenin, MD  omeprazole (PRILOSEC) 20 MG capsule Take 1 capsule (20 mg total) by mouth daily. 12/06/15   Hassell Done, Mary-Margaret, FNP  oxycodone (OXY-IR) 5 MG capsule Take 1 capsule (5 mg total) by mouth every 8 (eight) hours as needed for pain. Patient taking differently: Take 2.5 mg by mouth every 8 (eight) hours as needed for pain.  08/04/18   Elgergawy, Silver Huguenin, MD  Polyethyl Glycol-Propyl Glycol (SYSTANE) 0.4-0.3 % SOLN Place 1 drop into the right eye every 6 (six) hours as needed (dry eyes).     [provider]  polyethylene glycol (MIRALAX / GLYCOLAX) packet Take 17 g by mouth 2 (two) times daily.    [provider]  tobramycin (TOBREX) 0.3 % ophthalmic solution Place 1 drop into both eyes 4 (four)  times daily. 2 day course starting on 09/24/2018 for infection    [provider]    Family History Family History  Problem Relation Age of Onset  . Hyperlipidemia Mother   . Hypertension Mother   . Deep vein thrombosis Mother   . Pulmonary embolism Mother   . Diabetes Mother   . Prostate cancer Father   . Cancer Father 45  . Colon cancer Sister   . Depression Sister   . Dementia Sister   . Dementia Sister   . Scleroderma Brother  Social History Social History   Tobacco Use  . Smoking status: Former Smoker    Types: Cigarettes, Pipe, Cigars    Last attempt to quit: 11/08/2003    Years since quitting: 15.2  . Smokeless tobacco: Former Systems developer    Quit date: 11/08/2003  Substance Use Topics  . Alcohol use: No  . Drug use: No     Allergies   Actos [pioglitazone]; Bactrim; Doxycycline; Flagyl [metronidazole hcl]; Gabapentin; Lyrica [pregabalin]; Motrin [ibuprofen]; Nsaids; Ultram [tramadol hcl]; Vioxx [rofecoxib]; Celebrex [celecoxib]; Celebrex [celecoxib]; and Morphine and related   Review of Systems Review of Systems  Unable to perform ROS: Dementia     Physical Exam Updated Vital Signs BP (!) 173/86 (BP Location: Left Arm)   Pulse 62   Temp 98 F (36.7 C) (Oral)   Resp 18   SpO2 93%   Vital signs normal except for hypertension   Physical Exam Vitals signs and nursing note reviewed.  Constitutional:      General: He is not in acute distress.    Appearance: Normal appearance.  HENT:     Head: Normocephalic.     Comments: Patient has a superficial laceration at the lateral aspect of the right eyebrow that has Steri-Strips already applied, there is no bleeding.  He states he hurts on top of his head however I do not feel any abnormality there.    Right Ear: External ear normal.     Left Ear: External ear normal.     Nose: Nose normal.     Mouth/Throat:     Mouth: Mucous membranes are moist.  Eyes:     Extraocular Movements: Extraocular movements  intact.     Conjunctiva/sclera: Conjunctivae normal.     Pupils: Pupils are equal, round, and reactive to light.  Neck:     Musculoskeletal: Normal range of motion and neck supple.  Cardiovascular:     Rate and Rhythm: Normal rate and regular rhythm.  Pulmonary:     Effort: Pulmonary effort is normal. No respiratory distress.     Breath sounds: Normal breath sounds.  Musculoskeletal:     Right lower leg: No edema.     Left lower leg: No edema.     Comments: Patient is noted to hold his right arm still at his side, there is some mild swelling in that area and he is tender there.  He is able to flex at the knees and hips and he states he has some discomfort in the left hip.  There is no foreshortening or internal or external rotation of the leg  Skin:    General: Skin is warm and dry.     Capillary Refill: Capillary refill takes less than 2 seconds.  Neurological:     General: No focal deficit present.     Mental Status: He is alert. Mental status is at baseline.     Cranial Nerves: No cranial nerve deficit.  Psychiatric:        Mood and Affect: Mood normal.        Behavior: Behavior normal.      ED Treatments / Results  Labs (all labs ordered are listed, but only abnormal results are displayed) Labs Reviewed - No data to display  EKG None  Radiology  Dg Pelvis 1-2 Views  Result Date: 01/30/2019 CLINICAL DATA:  Fall. EXAM: PELVIS - 1-2 VIEW COMPARISON:  Left hip 08/19/2018 FINDINGS: Postoperative changes with internal fixation of the left hip. Degenerative changes in the lower lumbar spine and  both hips. No evidence of acute fracture or dislocation. SI joints and symphysis pubis are not displaced. Vascular calcifications. IMPRESSION: No acute bony abnormalities. Electronically Signed   By: Lucienne Capers M.D.   On: 01/30/2019 02:15   Dg Shoulder Right  Result Date: 01/30/2019 CLINICAL DATA:  Fall at nursing none. EXAM: RIGHT SHOULDER - 2+ VIEW COMPARISON:  04/26/2016  FINDINGS: Impacted fracture of the right humeral neck with extension of the glenohumeral joint. Sagittal fracture over the lateral aspect of the humeral head with mild displacement of the greater tuberosity fragment. No angulation. Degenerative changes in the glenohumeral and acromioclavicular joints. Soft tissues are unremarkable. No expansile or destructive bone lesions. IMPRESSION: Comminuted fractures of the right humeral head and neck with impaction of transverse fracture fragment and extension to the glenohumeral joint. Electronically Signed   By: Lucienne Capers M.D.   On: 01/30/2019 02:14    Ct Head Wo Contrast  Ct Cervical Spine Wo Contrast  Result Date: 01/30/2019 CLINICAL DATA:  Altered mental status. Fall. Abrasion to the forehead. EXAM: CT HEAD WITHOUT CONTRAST CT CERVICAL SPINE WITHOUT CONTRAST TECHNIQUE: Multidetector CT imaging of the head and cervical spine was performed following the standard protocol without intravenous contrast. Multiplanar CT image reconstructions of the cervical spine were also generated. COMPARISON:  CT head 09/26/2018. CT head and cervical spine 07/30/2018 FINDINGS: CT HEAD FINDINGS Brain: Diffuse cerebral atrophy. Ventricular dilatation consistent with central atrophy. Low-attenuation changes throughout the deep white matter consistent small vessel ischemia. No mass-effect or midline shift. No abnormal extra-axial fluid collections. Gray-white matter junctions are distinct. Basal cisterns are not effaced. No acute intracranial hemorrhage. Vascular: Prominent intra arterial vascular calcifications are present. Skull: Calvarium appears intact. No acute depressed skull fractures. Sinuses/Orbits: Retention cyst in the left maxillary antrum. Paranasal sinuses and mastoid air cells are otherwise clear. Other: None. CT CERVICAL SPINE FINDINGS Alignment: Normal alignment of the cervical vertebrae and facet joints. C1-2 articulation appears intact. Skull base and vertebrae:  Skull base appears intact. No vertebral compression deformities. No focal bone lesion or bone destruction. Bone cortex appears intact. Soft tissues and spinal canal: No prevertebral soft tissue swelling. No abnormal paraspinal soft tissue mass or infiltration. Disc levels: Degenerative changes throughout the cervical spine with narrowed disc spaces and endplate hypertrophic changes. Prominent bridging anterior osteophytes from C4 through C7. Degenerative changes in the facet joints with uncovertebral spurring causing bone encroachment upon the neural foramina at multiple levels. Upper chest: Lung apices are clear. Large right thyroid gland nodule measuring 4.1 cm in diameter. Similar appearance to previous study. Suggest further evaluation with thyroid ultrasound which can be done in the elective setting. Other: None. IMPRESSION: 1. No acute intracranial abnormalities. Chronic atrophy and small vessel ischemic changes. 2. Normal alignment of the cervical spine. Diffuse degenerative changes. No acute displaced fractures identified. 3. Large right thyroid gland nodule measuring 4.1 cm in diameter. Suggest further evaluation with thyroid ultrasound which can be done in the elective setting. Electronically Signed   By: Lucienne Capers M.D.   On: 01/30/2019 02:02    Procedures Procedures (including critical care time)  Medications Ordered in ED Medications - No data to display   Initial Impression / Assessment and Plan / ED Course  I have reviewed the triage vital signs and the nursing notes.  Pertinent labs & imaging results that were available during my care of the patient were reviewed by me and considered in my medical decision making (see chart for details).  Due to the trauma and unknown mechanism of injury, CT of the head and cervical spine was done.  Since I cannot see the x-rays that were done at the facility x-ray of his shoulder was done again including AP pelvis to look at both  hips.  Patient's final radiology studies were reviewed, he was placed in a sling and referred to orthopedics as an outpatient.  Final Clinical Impressions(s) / ED Diagnoses   Final diagnoses:  Fall at nursing home, initial encounter  Laceration of right eyebrow, initial encounter  Closed displaced fracture of surgical neck of right humerus with routine healing, unspecified fracture morphology, subsequent encounter    ED Discharge Orders    None     Plan discharge  Rolland Porter, MD, Barbette Or, MD 01/30/19 [phone]

## 2019-01-30 NOTE — ED Triage Notes (Signed)
Pt has dementia

## 2019-01-30 NOTE — Discharge Instructions (Addendum)
Have patient wear the sling for comfort.  It can be removed for bathing or dressing the patient.  Use ice packs for comfort.  Patient already has pain medications to take.  Please call Dr. Ruthe Mannan office to get a follow-up appointment.  Recheck by the ED if he has any problems listed on the head injury sheet.  Keep the Steri-Strips on the wound for the next 3 to 5 days.  Do not put any ointment or soap around the Steri-Strips.

## 2019-01-30 NOTE — ED Triage Notes (Signed)
Pt resident at [facility] facility. Pt has abrasion with butterfly steri strip on right side of head. Pt sent due to had xray of right humeral greater tuberosity fracture noted.

## 2019-01-30 NOTE — ED Notes (Signed)
Report given to Miranda at the facility

## 2019-01-30 NOTE — ED Notes (Signed)
Pt assisted to side of bed to use urinal; pt's brief changed and pt cleaned and repositioned back in bed; waiting for transport by ems back to facility

## 2019-02-02 ENCOUNTER — Telehealth: Payer: Self-pay | Admitting: Orthopedic Surgery

## 2019-02-02 NOTE — Telephone Encounter (Signed)
Call received from [facility], contact person, Lamount Cohen, ph# [phone], requesting appointment for follow up of shoulder fracture per Emergency room visit at [facility] 01/30/2019. States patient also has wound which occurred with this injury, and that it is being treated and managed at facility.  Offered appointment this week with Dr Luna Glasgow, including today, however, facility has a full schedule and cannot transport until at least Friday. Due to no providers on Friday this week, scheduled appointment for Monday, 02/08/2019, with Dr Aline Brochure.  States patient is sitting up and doing fine.

## 2019-02-08 ENCOUNTER — Ambulatory Visit (INDEPENDENT_AMBULATORY_CARE_PROVIDER_SITE_OTHER): Payer: Medicare Other | Admitting: Orthopedic Surgery

## 2019-02-08 VITALS — BP 132/89 | HR 103 | Ht 68.0 in | Wt 218.0 lb

## 2019-02-08 DIAGNOSIS — S42294A Other nondisplaced fracture of upper end of right humerus, initial encounter for closed fracture: Secondary | ICD-10-CM | POA: Diagnosis not present

## 2019-02-08 NOTE — Progress Notes (Signed)
Patient ID: [name], male   DOB: 04-25-1930, 85 y.o.   MRN: [id number]  Chief Complaint  Patient presents with  . Shoulder Injury    ER follow up on right shoulder fracture, DOI 01-30-19.    HPI [name] is a 85 y.o. male.  Presents for evaluation of his right shoulder fracture 88 fell at the nursing home.  He has pain in his right shoulder he is had it for 9 days he has some dementia so I cannot get a real estimation of quality or rate he has been treated with a sling and treated in the ER   Review of Systems Review of Systems  Unable to perform ROS: Dementia     Past Medical History:  Diagnosis Date  . CAD (coronary artery disease)    (Left main normal, LAD 30-40% mid stenosis, diagonal 30-40% stenosis, circumflex 30-40% stenosis, right coronary artery 30-40% stenosis. 2003.)  . CHF (congestive heart failure) ([place])   . Chronic back pain   . Chronic headache   . Chronic kidney disease   . Chronic pain    right hip/groin, neck, back  . Dementia ([place])   . Diabetes mellitus   . Diabetic neuropathy ([place])   . Diabetic retinopathy    right eye   . DVT of leg (deep venous thrombosis) ([place]) 1999   RLE   . GERD (gastroesophageal reflux disease)   . Goiter    stable x many year - last endo exam 08/2010  . Hiatal hernia   . HTN (hypertension)   . Hyperlipidemia   . Hypertension   . Lumbar spondylosis   . Macular degeneration   . Osteopenia   . Peripheral vertigo   . Prostate cancer ([place])   . Sleep apnea    CPAP  . Thyroid nodule   . Type 2 diabetes mellitus ([place])     Past Surgical History:  Procedure Laterality Date  . APPENDECTOMY    . biopsy of right ear    . CATARACT EXTRACTION, BILATERAL    . CHOLECYSTECTOMY    . INGUINAL HERNIA REPAIR     bilateral  . INGUINAL HERNIA REPAIR     Bilateral  . INTRAMEDULLARY (IM) NAIL INTERTROCHANTERIC Left 08/01/2018   Procedure: INTRAMEDULLARY (IM) NAIL INTERTROCHANTRIC;  Surgeon: Newt Minion, MD;  Location:  [facility];  Service: Orthopedics;  Laterality: Left;  . KNEE ARTHROSCOPY     left   . TONSILECTOMY, ADENOIDECTOMY, BILATERAL MYRINGOTOMY AND TUBES    . TONSILLECTOMY AND ADENOIDECTOMY      Family History  Problem Relation Age of Onset  . Hyperlipidemia Mother   . Hypertension Mother   . Deep vein thrombosis Mother   . Pulmonary embolism Mother   . Diabetes Mother   . Prostate cancer Father   . Cancer Father 48  . Colon cancer Sister   . Depression Sister   . Dementia Sister   . Dementia Sister   . Scleroderma Brother     Social History Social History   Tobacco Use  . Smoking status: Former Smoker    Types: Cigarettes, Pipe, Cigars    Last attempt to quit: 11/08/2003    Years since quitting: 15.2  . Smokeless tobacco: Former Systems developer    Quit date: 11/08/2003  Substance Use Topics  . Alcohol use: No  . Drug use: No    Allergies  Allergen Reactions  . Actos [Pioglitazone]     Allergic reaction  . Bactrim Other (See Comments)  Unknown   . Doxycycline     Caused black spots in his eyes  . Flagyl [Metronidazole Hcl]     unknown  . Gabapentin   . Lyrica [Pregabalin]     Increase appetite   . Motrin [Ibuprofen]   . Nsaids     Swelling    . Ultram [Tramadol Hcl]     Itching / rash     . Vioxx [Rofecoxib]     Swelling    . Celebrex [Celecoxib] Rash  . Celebrex [Celecoxib] Rash  . Morphine And Related Rash    Current Outpatient Medications  Medication Sig Dispense Refill  . acetaminophen (TYLENOL) 500 MG tablet Take 500 mg by mouth 2 (two) times daily.    . Amino Acids-Protein Hydrolys (FEEDING SUPPLEMENT, PRO-STAT SUGAR FREE 64,) LIQD Take 30 mLs by mouth 2 (two) times daily.    Marland Kitchen apixaban (ELIQUIS) 5 MG TABS tablet Take 1 tablet (5 mg total) by mouth 2 (two) times daily. 60 tablet   . donepezil (ARICEPT) 10 MG tablet Take 10 mg by mouth at bedtime.    . fentaNYL (DURAGESIC - DOSED MCG/HR) 25 MCG/HR patch Place 25 mcg onto the skin every 3 (three) days.    .  fentaNYL (DURAGESIC - DOSED MCG/HR) 50 MCG/HR Place 1 patch (50 mcg total) onto the skin every 3 (three) days. 3 patch 0  . finasteride (PROSCAR) 5 MG tablet Take 1 tablet (5 mg total) by mouth daily. 90 tablet 1  . loratadine (CLARITIN) 10 MG tablet Take 10 mg by mouth daily as needed for allergies.    . magnesium hydroxide (MILK OF MAGNESIA) 400 MG/5ML suspension Take 30 mLs by mouth daily as needed for mild constipation.    . Melatonin 3 MG TABS Take 1 tablet by mouth at bedtime.    . Menthol, Topical Analgesic, (BIOFREEZE EX) Apply 1 application topically every 6 (six) hours as needed (back, side, and shoulder pain).     . metFORMIN (GLUCOPHAGE) 500 MG tablet Take 500 mg by mouth every morning.    . metoprolol tartrate (LOPRESSOR) 25 MG tablet Take 1.5 tablets (37.5 mg total) by mouth 2 (two) times daily.    Marland Kitchen omeprazole (PRILOSEC) 20 MG capsule Take 1 capsule (20 mg total) by mouth daily. 90 capsule 1  . oxycodone (OXY-IR) 5 MG capsule Take 1 capsule (5 mg total) by mouth every 8 (eight) hours as needed for pain. (Patient taking differently: Take 2.5 mg by mouth every 8 (eight) hours as needed for pain. ) 15 capsule 0  . Polyethyl Glycol-Propyl Glycol (SYSTANE) 0.4-0.3 % SOLN Place 1 drop into the right eye every 6 (six) hours as needed (dry eyes).     . polyethylene glycol (MIRALAX / GLYCOLAX) packet Take 17 g by mouth 2 (two) times daily.    Marland Kitchen tobramycin (TOBREX) 0.3 % ophthalmic solution Place 1 drop into both eyes 4 (four) times daily. 2 day course starting on 09/24/2018 for infection     No current facility-administered medications for this visit.        Physical Exam Blood pressure 132/89, pulse (!) 103, height 5\' 8"  (1.727 m), weight 218 lb (98.9 kg). Physical Exam The patient is well developed well nourished and well groomed.  Orientation to person place and time is abnormal secondary to dementia Mood is pleasant.  Ambulatory status confined to wheelchair Cervical spine exam  is as follows nontender  Right shoulder  Examination: Inspection of the shoulder shows that there is ecchymosis  of the skin proximal humerus down into the upper arm. Tenderness at the proximal humerus and fracture site is noted.  Range of motion examination is deferred because of pain.  Motor exam hand wrist elbow normal including normal muscle tone.  Shoulder stability tests deferred because of fracture, elbow stable wrist stable.  Neurovascular examination is intact and the lymph nodes in the axilla and supraclavicular regions are normal   The opposite shoulder has no swelling, normal range of motion, no joint contracture subluxation atrophy tremor or skin lesion. Neurovascular exam is intact.   MEDICAL DECISION SECTION  xrays ordered at outside facility   My independent reading of xrays: Proximal humerus fracture right shoulder that meets near criteria for nonoperative treatment he does have proximal migration of the humerus which appears to be chronic with a break in [place]'s line.    Assessment    Right proximal humerus fracture    Plan    Sling-and-swathe for 2  weeks followed by x-ray of the shoulder. Then Physical therapy for 6 weeks   No orders of the defined types were placed in this encounter.

## 2019-02-08 NOTE — Patient Instructions (Signed)
Sling full time x 2 weeks

## 2019-02-18 DIAGNOSIS — S42201A Unspecified fracture of upper end of right humerus, initial encounter for closed fracture: Secondary | ICD-10-CM | POA: Insufficient documentation

## 2019-02-19 ENCOUNTER — Ambulatory Visit (INDEPENDENT_AMBULATORY_CARE_PROVIDER_SITE_OTHER): Payer: Medicare Other | Admitting: Orthopedic Surgery

## 2019-02-19 ENCOUNTER — Encounter: Payer: Self-pay | Admitting: Orthopedic Surgery

## 2019-02-19 ENCOUNTER — Ambulatory Visit (INDEPENDENT_AMBULATORY_CARE_PROVIDER_SITE_OTHER): Payer: Medicare Other

## 2019-02-19 VITALS — BP 142/78 | HR 79 | Ht 68.0 in | Wt 218.0 lb

## 2019-02-19 DIAGNOSIS — S42294D Other nondisplaced fracture of upper end of right humerus, subsequent encounter for fracture with routine healing: Secondary | ICD-10-CM | POA: Diagnosis not present

## 2019-02-19 NOTE — Patient Instructions (Signed)
- 

## 2019-02-19 NOTE — Progress Notes (Signed)
Chief Complaint  Patient presents with  . Shoulder Injury    01/30/19 fracture right shoulder     Encounter Diagnosis  Name Primary?  . Other closed nondisplaced fracture of proximal end of right humerus with routine healing, subsequent encounter 01/30/2019 Yes    85 yo male post injury day 20, global period 11/90  His complaints are "I have been better"  His exam shows mild tenderness over the proximal humerus mild swelling of the proximal humerus and proximal forearm  The x-ray shows reasonable alignment of the right shoulder and in this 85 year old we would not recommend surgery at this time  Start OT/PT   Follow-up in 2 months

## 2019-02-22 ENCOUNTER — Ambulatory Visit: Payer: Medicare Other | Admitting: Orthopedic Surgery

## 2019-02-28 ENCOUNTER — Other Ambulatory Visit
Admission: RE | Admit: 2019-02-28 | Discharge: 2019-02-28 | Disposition: A | Discharge status: Home / Self Care | Payer: Medicare Other | Source: Ambulatory Visit | Attending: Internal Medicine | Admitting: Internal Medicine

## 2019-02-28 LAB — CBC WITH DIFFERENTIAL/PLATELET
Abs Immature Granulocytes: 0.02 10*3/uL (ref 0.00–0.07)
BASOS ABS: 0.1 10*3/uL (ref 0.0–0.1)
Basophils Relative: 1 %
Eosinophils Absolute: 0.1 10*3/uL (ref 0.0–0.5)
Eosinophils Relative: 2 %
HEMATOCRIT: 35.9 % — AB (ref 39.0–52.0)
HEMOGLOBIN: 11.5 g/dL — AB (ref 13.0–17.0)
IMMATURE GRANULOCYTES: 0 %
LYMPHS PCT: 42 %
Lymphs Abs: 2.6 10*3/uL (ref 0.7–4.0)
MCH: 29.8 pg (ref 26.0–34.0)
MCHC: 32 g/dL (ref 30.0–36.0)
MCV: 93 fL (ref 80.0–100.0)
Monocytes Absolute: 0.4 10*3/uL (ref 0.1–1.0)
Monocytes Relative: 6 %
NEUTROS ABS: 3 10*3/uL (ref 1.7–7.7)
NEUTROS PCT: 49 %
NRBC: 0 % (ref 0.0–0.2)
Platelets: 193 10*3/uL (ref 150–400)
RBC: 3.86 MIL/uL — AB (ref 4.22–5.81)
RDW: 14.6 % (ref 11.5–15.5)
WBC: 6.2 10*3/uL (ref 4.0–10.5)

## 2019-02-28 LAB — BASIC METABOLIC PANEL
ANION GAP: 11 (ref 5–15)
BUN: 36 mg/dL — AB (ref 8–23)
CHLORIDE: 103 mmol/L (ref 98–111)
CO2: 25 mmol/L (ref 22–32)
Calcium: 8.3 mg/dL — ABNORMAL LOW (ref 8.9–10.3)
Creatinine, Ser: 1.13 mg/dL (ref 0.61–1.24)
GFR calc non Af Amer: 59 mL/min — ABNORMAL LOW (ref >60)
Glucose, Bld: 159 mg/dL — ABNORMAL HIGH (ref 70–99)
POTASSIUM: 4.2 mmol/L (ref 3.5–5.1)
SODIUM: 139 mmol/L (ref 135–145)

## 2019-03-02 ENCOUNTER — Telehealth: Payer: Self-pay | Admitting: Orthopedic Surgery

## 2019-03-02 NOTE — Telephone Encounter (Signed)
Patient's daughter/POA, Flossie Dibble ph# [phone], came by office to ask if patient may get a replacement sling. States he is a resident at [facility] [phone]), and that the sling has become very soiled, said appears to have been dropped on floor.  Please advise.

## 2019-03-02 NOTE — Telephone Encounter (Signed)
They [email or web address] [id number] when I am not at lunch and I can get him one

## 2019-03-02 NOTE — Telephone Encounter (Signed)
Called back to patient's daughter/POA; relayed.

## 2019-03-31 DEATH — deceased

## 2019-04-21 ENCOUNTER — Ambulatory Visit: Payer: Medicare Other | Admitting: Orthopedic Surgery
